# Patient Record
Sex: Male | Born: 1965 | Race: White | Hispanic: No | Marital: Married | Smoking: Never smoker
Health system: Southern US, Community
[De-identification: ages and names within clinical notes are randomized; demographics above are authoritative.]

## PROBLEM LIST (undated history)

## (undated) DIAGNOSIS — I639 Cerebral infarction, unspecified: Secondary | ICD-10-CM

## (undated) DIAGNOSIS — C189 Malignant neoplasm of colon, unspecified: Secondary | ICD-10-CM

## (undated) DIAGNOSIS — R809 Proteinuria, unspecified: Secondary | ICD-10-CM

## (undated) DIAGNOSIS — E7800 Pure hypercholesterolemia, unspecified: Secondary | ICD-10-CM

## (undated) DIAGNOSIS — I509 Heart failure, unspecified: Secondary | ICD-10-CM

## (undated) DIAGNOSIS — K047 Periapical abscess without sinus: Secondary | ICD-10-CM

## (undated) DIAGNOSIS — I10 Essential (primary) hypertension: Secondary | ICD-10-CM

## (undated) HISTORY — DX: Periapical abscess without sinus: K04.7

## (undated) HISTORY — DX: Cerebral infarction, unspecified: I63.9

## (undated) HISTORY — PX: APPENDECTOMY: SHX54

## (undated) HISTORY — DX: Proteinuria, unspecified: R80.9

## (undated) HISTORY — PX: COLON SURGERY: SHX602

## (undated) HISTORY — PX: CHOLECYSTECTOMY: SHX55

---

## 1998-12-30 DIAGNOSIS — E119 Type 2 diabetes mellitus without complications: Secondary | ICD-10-CM | POA: Insufficient documentation

## 2005-10-13 ENCOUNTER — Emergency Department (HOSPITAL_COMMUNITY): Admission: EM | Admit: 2005-10-13 | Discharge: 2005-10-13 | Payer: Self-pay | Admitting: Emergency Medicine

## 2007-11-29 ENCOUNTER — Emergency Department: Payer: Self-pay | Admitting: Internal Medicine

## 2010-05-13 ENCOUNTER — Inpatient Hospital Stay (HOSPITAL_COMMUNITY): Admission: EM | Admit: 2010-05-13 | Discharge: 2010-05-17 | Payer: Self-pay | Admitting: Emergency Medicine

## 2010-05-13 ENCOUNTER — Ambulatory Visit: Payer: Self-pay | Admitting: Cardiology

## 2010-05-13 DIAGNOSIS — I509 Heart failure, unspecified: Secondary | ICD-10-CM | POA: Insufficient documentation

## 2010-05-13 DIAGNOSIS — I428 Other cardiomyopathies: Secondary | ICD-10-CM | POA: Insufficient documentation

## 2010-05-13 LAB — CONVERTED CEMR LAB
BUN: 14 mg/dL
CO2: 31 meq/L
Calcium: 8.8 mg/dL
Chloride: 98 meq/L
Cholesterol: 192 mg/dL
Creatinine, Ser: 0.98 mg/dL
Glucose, Bld: 196 mg/dL
HDL: 32 mg/dL
Hgb A1c MFr Bld: 11.6 %
LDL Cholesterol: 131 mg/dL
Potassium: 3.6 meq/L
Sodium: 135 meq/L
TSH: 0.679 microintl units/mL
Triglycerides: 146 mg/dL

## 2010-05-17 LAB — CONVERTED CEMR LAB
HCT: 43.5 %
Hemoglobin: 14.6 g/dL
MCHC: 33.5 g/dL
MCV: 86.5 fL
Platelets: 227 10*3/uL
RBC: 5.02 M/uL
RDW: 13.3 %
WBC: 7.8 10*3/uL

## 2010-05-30 ENCOUNTER — Ambulatory Visit: Payer: Self-pay | Admitting: Nurse Practitioner

## 2010-05-30 DIAGNOSIS — E785 Hyperlipidemia, unspecified: Secondary | ICD-10-CM

## 2010-05-30 DIAGNOSIS — I10 Essential (primary) hypertension: Secondary | ICD-10-CM

## 2010-05-30 DIAGNOSIS — C189 Malignant neoplasm of colon, unspecified: Secondary | ICD-10-CM

## 2010-05-30 LAB — CONVERTED CEMR LAB
Bilirubin Urine: NEGATIVE
Blood Glucose, Fingerstick: 202
Blood in Urine, dipstick: NEGATIVE
Cholesterol, target level: 200 mg/dL
Glucose, Urine, Semiquant: NEGATIVE
HDL goal, serum: 40 mg/dL
Ketones, urine, test strip: NEGATIVE
LDL Goal: 70 mg/dL
Microalb, Ur: 1.69 mg/dL (ref 0.00–1.89)
Nitrite: NEGATIVE
Protein, U semiquant: NEGATIVE
Specific Gravity, Urine: 1.015
Urobilinogen, UA: 0.2
WBC Urine, dipstick: NEGATIVE
pH: 5

## 2010-06-01 ENCOUNTER — Ambulatory Visit: Payer: Self-pay | Admitting: Internal Medicine

## 2010-06-01 DIAGNOSIS — I5022 Chronic systolic (congestive) heart failure: Secondary | ICD-10-CM

## 2010-06-13 ENCOUNTER — Telehealth (INDEPENDENT_AMBULATORY_CARE_PROVIDER_SITE_OTHER): Payer: Self-pay | Admitting: Nurse Practitioner

## 2010-06-29 ENCOUNTER — Ambulatory Visit: Payer: Self-pay | Admitting: Nurse Practitioner

## 2010-06-29 DIAGNOSIS — E669 Obesity, unspecified: Secondary | ICD-10-CM | POA: Insufficient documentation

## 2010-06-29 LAB — CONVERTED CEMR LAB: Blood Glucose, Fingerstick: 128

## 2010-07-24 ENCOUNTER — Telehealth (INDEPENDENT_AMBULATORY_CARE_PROVIDER_SITE_OTHER): Payer: Self-pay | Admitting: Nurse Practitioner

## 2010-07-25 ENCOUNTER — Ambulatory Visit: Payer: Self-pay | Admitting: Internal Medicine

## 2010-08-03 ENCOUNTER — Telehealth (INDEPENDENT_AMBULATORY_CARE_PROVIDER_SITE_OTHER): Payer: Self-pay | Admitting: Nurse Practitioner

## 2010-08-24 ENCOUNTER — Ambulatory Visit: Payer: Self-pay | Admitting: Nurse Practitioner

## 2010-08-24 DIAGNOSIS — I8390 Asymptomatic varicose veins of unspecified lower extremity: Secondary | ICD-10-CM

## 2010-08-24 LAB — CONVERTED CEMR LAB
Blood Glucose, Fingerstick: 98
Cholesterol, target level: 200 mg/dL
HDL goal, serum: 40 mg/dL
Hgb A1c MFr Bld: 6 %
LDL Goal: 100 mg/dL

## 2010-10-01 ENCOUNTER — Ambulatory Visit: Payer: Self-pay | Admitting: Internal Medicine

## 2010-12-26 ENCOUNTER — Ambulatory Visit: Payer: Self-pay | Admitting: Internal Medicine

## 2011-01-11 ENCOUNTER — Telehealth (INDEPENDENT_AMBULATORY_CARE_PROVIDER_SITE_OTHER): Payer: Self-pay | Admitting: Nurse Practitioner

## 2011-01-15 ENCOUNTER — Encounter: Payer: Self-pay | Admitting: Internal Medicine

## 2011-01-21 ENCOUNTER — Ambulatory Visit (HOSPITAL_COMMUNITY)
Admission: RE | Admit: 2011-01-21 | Discharge: 2011-01-21 | Payer: Self-pay | Source: Home / Self Care | Attending: Internal Medicine | Admitting: Internal Medicine

## 2011-01-21 ENCOUNTER — Ambulatory Visit: Admission: RE | Admit: 2011-01-21 | Discharge: 2011-01-21 | Payer: Self-pay | Source: Home / Self Care

## 2011-01-27 LAB — CONVERTED CEMR LAB
BUN: 21 mg/dL (ref 6–23)
CO2: 27 meq/L (ref 19–32)
Calcium: 9.1 mg/dL (ref 8.4–10.5)
Chloride: 106 meq/L (ref 96–112)
Creatinine, Ser: 1 mg/dL (ref 0.4–1.5)
GFR calc non Af Amer: 92.43 mL/min (ref >60)
Glucose, Bld: 223 mg/dL — ABNORMAL HIGH (ref 70–99)
Potassium: 4.5 meq/L (ref 3.5–5.1)
Pro B Natriuretic peptide (BNP): 161.5 pg/mL — ABNORMAL HIGH (ref 0.0–100.0)
Sodium: 139 meq/L (ref 135–145)

## 2011-01-29 NOTE — Assessment & Plan Note (Signed)
Summary: /PER CHECK OUT/HAPPY BIRTHDAY/SAF   Primary Provider:  [facility]  CC:  rov.  Pt states he is doing alright but he keeps pain in his back.  Dr. 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  Marland Kitchen  History of Present Illness: Jared Hart is a 45 y/o male with HTN and DM2. Admitted in @ay  2011 for dyspnea and CP.  Cath with normal coronaries and EF 25% and marked colume overload.  RHC data:  Right atrial pressure 14.  Right ventricular pressure 47/12.  Right ventricular end-diastolic pressure 14.  Pulmonary artery pressure 54/20 (39).  Pulmonary capillary wedge pressure 26.Cardiac output 5.76 L per minute.  Cardiac index 2.52 L per minute per meter squared.  Pulmonary artery saturation 65%.  Diuresed.   At last visit carvedilol increased to 9.375 two times a day. Has tolerated well. Lifting weights and walking 2-3 miles every other day. No CP or SOB.  No swelling. Compliant with all meds. no lightheadedness. Not weighing every day but weight stable.   Current Medications (verified): 1)  Carvedilol 6.25 Mg Tabs (Carvedilol) .... Take 1 & 1/2 Tabs By Mouth Twice A Day 2)  Klor-Con M20 20 Meq Cr-Tabs (Potassium Chloride Crys Cr) .... One Tablet By Mouth Daily 3)  Aspir-Low 81 Mg Tbec (Aspirin) .... Take One Tablet By Mouth Daily 4)  Furosemide 40 Mg Tabs (Furosemide) .... One Tablet By Mouth Daily 5)  Lisinopril 20 Mg Tabs (Lisinopril) .... One Tablet By Mouth Daily For Blood Pressure 6)  Spironolactone 25 Mg Tabs (Spironolactone) .... One Tablet By Mouth Daily 7)  Pravastatin Sodium 40 Mg Tabs (Pravastatin Sodium) .... One Tablet By Mouth Nightly For Cholesterol 8)  Relion Ultima Test  Strp (Glucose Blood) 9)  Novolog 100 Unit/ml Soln (Insulin Aspart) .Marland Kitchen.. 15 Units Subcutaneously Two Times A Day  Allergies (verified): No Known Drug Allergies  Past History:  Past Medical History: Last updated: 06/01/2010 Current Problems:  HYPERLIPIDEMIA  (ICD-272.4) HYPERTENSION, BENIGN ESSENTIAL (ICD-401.1) CARDIOMYOPATHY (ICD-425.4)    --onset 5/11: EF 25% normal coronaries CHF (ICD-428.0) Hx of COLON CANCER (ICD-153.9) DIABETES MELLITUS, UNCONTROLLED (ICD-250.02)    Review of Systems       As per HPI and past medical history; otherwise all systems negative.   Vital Signs:  Patient profile:   45 year old male Height:      70.5 inches Weight:      224 pounds BMI:     31.80 Pulse rate:   88 / minute Pulse rhythm:   regular BP sitting:   132 / 74  (right arm) Cuff size:   regular  Vitals Entered By: Judithe Modest CMA (July 25, 2010 3:09 PM)  Physical Exam  General:  Gen: well appearing. no resp difficulty HEENT: normal Neck: supple. no JVD. Carotids 2+ bilat; no bruits. No lymphadenopathy or thryomegaly appreciated. Cor: PMI nondisplaced. Regular rate & rhythm. No rubs, gallops, murmur. Lungs: clear Abdomen: soft, nontender, nondistended. No hepatosplenomegaly. No bruits or masses. Good bowel sounds. Extremities: no cyanosis, clubbing, rash, edema Neuro: alert & orientedx3, cranial nerves grossly intact. moves all 4 extremities w/o difficulty. affect pleasant    Impression & Recommendations:  Problem # 1:  SYSTOLIC HEART FAILURE, CHRONIC (ICD-428.22) Doing very well. NYHA class I. Volume status looks good. Will titrate Coreg to 12.5 two times a day.See back in 6 weeks to titrate meds. Will check echo at next visit. Suspect EF may have recovered.   His updated medication list for  this problem includes:    Carvedilol 12.5 Mg Tabs (Carvedilol) ..... One twice a day    Aspir-low 81 Mg Tbec (Aspirin) .Marland Kitchen... Take one tablet by mouth daily    Furosemide 40 Mg Tabs (Furosemide) ..... One tablet by mouth daily    Lisinopril 20 Mg Tabs (Lisinopril) ..... One tablet by mouth daily for blood pressure    Spironolactone 25 Mg Tabs (Spironolactone) ..... One tablet by mouth daily  Patient Instructions: 1)  Your physician  recommends that you schedule a follow-up appointment in: 6 to 8 weeks  2)  Your physician has recommended you make the following change in your medication: Increase carvedilol to 12.5 mg one twice a day Prescriptions: CARVEDILOL 12.5 MG TABS (CARVEDILOL) one twice a day  #60 x 6   Entered by:   Charolotte Capuchin, RN   Authorized by:   Dolores Patty, MD, [facility]   Signed by:   Charolotte Capuchin, RN on 07/25/2010   Method used:   Electronically to        [facility] [phone]* (retail)       [address]       [place], [place]  [address]       Ph: [phone]       Fax: [phone]   RxID:   [id number]

## 2011-01-29 NOTE — Progress Notes (Signed)
Summary: refills  Phone Note Call from Patient Call back at Home Phone [phone] Call back at [phone]   Summary of Call: [name] needs refills from all his medicaitions including: carvedilol,klor-con, aspir, furosemide, lisinopril, sprionouctone, novolog prevastatin relion etc.  ([facility] [address]) Daphine Deutscher FNP  Initial call taken by: Manon Hilding,  June 13, 2010 11:20 AM  Follow-up for Phone Call        sent to N. Daphine Deutscher.  Dutch Quint RN  June 13, 2010 12:09 PM   Additional Follow-up for Phone Call Additional follow up Details #1::        meds sent as per pt request to [facility] except Aspirin is over the counter - no prescription needed Carvedilol was filled on 06/01/2010 by cardiology so either he was given the Rx during that visit or they called it into the pharmacy Additional Follow-up by: Lehman Prom FNP,  June 13, 2010 1:03 PM    Additional Follow-up for Phone Call Additional follow up Details #2::    Spoke with pt. -- advised as to refills sent.  Dutch Quint RN  June 13, 2010 2:05 PM  Follow-up by: Dutch Quint RN,  June 13, 2010 2:05 PM  Prescriptions: NOVOLOG MIX 70/30 FLEXPEN 70-30 % SUSP (INSULIN ASPART PROT & ASPART) 15 units subcutaneously two times a day  #1 month qs x 3   Entered and Authorized by:   Lehman Prom FNP   Signed by:   Lehman Prom FNP on 06/13/2010   Method used:   Electronically to        [facility] [phone]* (retail)       [address]       [place], [place]  [address]       Ph: [phone]       Fax: [phone]   RxID:   [id number] PRAVASTATIN SODIUM 40 MG TABS (PRAVASTATIN SODIUM) One tablet by mouth nightly for cholesterol  #30 x 3   Entered and Authorized by:   Lehman Prom FNP   Signed by:   Lehman Prom FNP on 06/13/2010   Method used:   Electronically to        [facility] [phone]* (retail)       [address]       [place], [place]  [address]       Ph: [phone]       Fax: [phone]   RxID:   [id number] SPIRONOLACTONE 25 MG TABS (SPIRONOLACTONE) One tablet by mouth daily  #30 x 3   Entered and Authorized by:   Lehman Prom FNP   Signed by:   Lehman Prom FNP on 06/13/2010   Method used:   Electronically to        [facility] [phone]* (retail)       [address]       [place], [place]  [address]       Ph: [phone]       Fax: [phone]   RxID:   [id number] LISINOPRIL 20 MG TABS (LISINOPRIL) One tablet by mouth daily for blood pressure  #30 x 3   Entered and Authorized by:   Lehman Prom FNP   Signed by:   Lehman Prom FNP on 06/13/2010   Method used:   Electronically to        [facility] [phone]* (retail)       [address]       [place], [place]  [address]  Ph: [phone]       Fax: [phone]   RxID:   [id number] FUROSEMIDE 40 MG TABS (FUROSEMIDE) One tablet by mouth daily  #30 x 3   Entered and Authorized by:   Lehman Prom FNP   Signed by:   Lehman Prom FNP on 06/13/2010   Method used:   Electronically to        [facility] [phone]* (retail)       [address]       [place], [place]  [address]       Ph: [phone]       Fax: [phone]   RxID:   [id number] KLOR-CON M20 20 MEQ CR-TABS (POTASSIUM CHLORIDE CRYS CR) One tablet by mouth daily  #30 x 3   Entered and Authorized by:   Lehman Prom FNP   Signed by:   Lehman Prom FNP on 06/13/2010   Method used:   Electronically to        [facility] [phone]* (retail)       [address]       [place], [place]  [address]       Ph: [phone]       Fax: [phone]   RxID:   [id number]   Appended Document: refills    Clinical Lists Changes  Medications: Changed medication from NOVOLOG MIX 70/30 FLEXPEN 70-30 % SUSP (INSULIN ASPART PROT & ASPART) 15 units subcutaneously two times a day to NOVOLOG 100 UNIT/ML SOLN (INSULIN ASPART) 15 units subcutaneously two times a day - Signed Rx of NOVOLOG 100 UNIT/ML SOLN  (INSULIN ASPART) 15 units subcutaneously two times a day;  #1 month qs x 1;  Signed;  Entered by: Lehman Prom FNP;  Authorized by: Lehman Prom FNP;  Method used: Electronically to [facility] [phone]*, [address] [place], [place]  [address], Ph: [phone], Fax: [phone]    Prescriptions: NOVOLOG 100 UNIT/ML SOLN (INSULIN ASPART) 15 units subcutaneously two times a day  #1 month qs x 1   Entered and Authorized by:   Lehman Prom FNP   Signed by:   Lehman Prom FNP on 06/15/2010   Method used:   Electronically to        [facility] [phone]* (retail)       [address]       [place], [place]  [address]       Ph: [phone]       Fax: [phone]   RxID:   [id number]

## 2011-01-29 NOTE — Assessment & Plan Note (Signed)
Summary: Diabetes/HTN   Vital Signs:  Patient profile:   45 year old male Weight:      224 pounds BMI:     31.80 Temp:     97.9 degrees F oral Pulse rate:   79 / minute Pulse rhythm:   regular Resp:     20 per minute BP sitting:   132 / 86  (left arm) Cuff size:   large  Vitals Entered By: Levon Hedger (June 29, 2010 10:22 AM) CC: follow-up visit Dm...pt states he has noticed white color on his toenails since he started medication, Lipid Management, CHF Management Is Patient Diabetic? Yes Pain Assessment Patient in pain? yes     Location: back Onset of pain  Constant CBG Result 128  Does patient need assistance? Functional Status Self care Ambulation Normal   Primary Care Provider:  [facility]  CC:  follow-up visit Dm...pt states he has noticed white color on his toenails since he started medication, Lipid Management, and CHF Management.  History of Present Illness:  Pt into the office for follow up - diabetes  Pt presents today with ALL his medication He did not bring his blood sugar log with him but reports that he is checking twice daily. Pt wanted an insulin pen during his last visit but was not able to afford so he was written for the vial which pt reports that he has received and is taking as ordered  Obesity - Pt is down 10 more pounds since his last visit Pt is really making a conscious effort at decreasing his weight. He is also exercising and walking.  Cardiology - pt had an appt on last week but he was rescheduled an appt on July 27th.  CHF History:      He denies headache, chest pain, palpitations, dyspnea with exertion, and peripheral edema.  Daily weights are not being checked.  He understands fluid management and sodium restriction and is following this regimen.  The patient expresses understanding of the treatment plan and his medications.  He admits to being compliant with his medications.  ADL's are not being done without symptoms or  restrictions.  The patient has not been enrolled in the CHF Eduction Program.  He denies any side effects from his medications.    Diabetes Management History:      The patient is a 45 years old male who comes in for evaluation of DM Type 2.  He has not been enrolled in the "Diabetic Education Program".  He states understanding of dietary principles and is following his diet appropriately.  Sensory loss is noted.  Self foot exams are not being performed.  He is checking home blood sugars.  He says that he is exercising.        Hypoglycemic symptoms are not occurring.  No hyperglycemic symptoms are reported.        No changes have been made to his treatment plan since last visit.    Lipid Management History:      Positive NCEP/ATP III risk factors include diabetes, HDL cholesterol less than 40, family history for ischemic heart disease (females less than 45 years old), and hypertension.  Negative NCEP/ATP III risk factors include male age less than 55 years old, non-tobacco-user status, no ASHD (atherosclerotic heart disease), no prior stroke/TIA, no peripheral vascular disease, and no history of aortic aneurysm.        The patient states that he knows about the "Therapeutic Lifestyle Change" diet.  His compliance with the  TLC diet is good.  The patient does not know about adjunctive measures for cholesterol lowering.  Adjunctive measures started by the patient include weight reduction.  He expresses no side effects from his lipid-lowering medication.  The patient denies any symptoms to suggest myopathy or liver disease.      Habits & Providers  Alcohol-Tobacco-Diet     Alcohol drinks/day: 0     Tobacco Status: never  Exercise-Depression-Behavior     Does Patient Exercise: yes     Exercise Counseling: to improve exercise regimen     Have you felt down or hopeless? no     Have you felt little pleasure in things? no     Depression Counseling: not indicated; screening negative for depression      Drug Use: never  Medications Prior to Update: 1)  Carvedilol 6.25 Mg Tabs (Carvedilol) .... Take 1 & 1/2 Tabs By Mouth Twice A Day 2)  Klor-Con M20 20 Meq Cr-Tabs (Potassium Chloride Crys Cr) .... One Tablet By Mouth Daily 3)  Aspir-Low 81 Mg Tbec (Aspirin) .... Take One Tablet By Mouth Daily 4)  Furosemide 40 Mg Tabs (Furosemide) .... One Tablet By Mouth Daily 5)  Lisinopril 20 Mg Tabs (Lisinopril) .... One Tablet By Mouth Daily For Blood Pressure 6)  Spironolactone 25 Mg Tabs (Spironolactone) .... One Tablet By Mouth Daily 7)  Pravastatin Sodium 40 Mg Tabs (Pravastatin Sodium) .... One Tablet By Mouth Nightly For Cholesterol 8)  Relion Ultima Test  Strp (Glucose Blood) 9)  Novolog 100 Unit/ml Soln (Insulin Aspart) .Marland Kitchen.. 15 Units Subcutaneously Two Times A Day  Allergies (verified): No Known Drug Allergies  Review of Systems CV:  Denies chest pain or discomfort. Resp:  Denies cough. GI:  Denies abdominal pain, nausea, and vomiting. GU:  Denies dysuria. MS:  toenails are getting white. Endo:  Denies excessive thirst and excessive urination.  Physical Exam  General:  alert.   Head:  normocephalic.   Lungs:  normal breath sounds.   Heart:  normal rate and regular rhythm.   Abdomen:  normal bowel sounds.   Msk:  normal ROM.   Neurologic:  alert & oriented X3.   Skin:  color normal.   Psych:  Oriented X3.    Diabetes Management Exam:    Foot Exam (with socks and/or shoes not present):       Sensory-Monofilament:          Left foot: normal          Right foot: normal       Nails:          Left foot: toe nail white          Right foot: toe nail white   Impression & Recommendations:  Problem # 1:  DIABETES MELLITUS, UNCONTROLLED (ICD-250.02) Pt is doing better advised His updated medication list for this problem includes:    Aspir-low 81 Mg Tbec (Aspirin) .Marland Kitchen... Take one tablet by mouth daily    Lisinopril 20 Mg Tabs (Lisinopril) ..... One tablet by mouth daily for blood  pressure    Novolog 100 Unit/ml Soln (Insulin aspart) .Marland KitchenMarland KitchenMarland KitchenMarland Kitchen 15 units subcutaneously two times a day  Orders: Capillary Blood Glucose/CBG ([address])  Problem # 2:  HYPERTENSION, BENIGN ESSENTIAL (ICD-401.1) BP is doing well. Reviewed DASH diet His updated medication list for this problem includes:    Carvedilol 6.25 Mg Tabs (Carvedilol) .Marland Kitchen... Take 1 & 1/2 tabs by mouth twice a day    Furosemide 40 Mg Tabs (Furosemide) .Marland KitchenMarland KitchenMarland KitchenMarland Kitchen  One tablet by mouth daily    Lisinopril 20 Mg Tabs (Lisinopril) ..... One tablet by mouth daily for blood pressure    Spironolactone 25 Mg Tabs (Spironolactone) ..... One tablet by mouth daily  Problem # 3:  HYPERLIPIDEMIA (ICD-272.4)  His updated medication list for this problem includes:    Pravastatin Sodium 40 Mg Tabs (Pravastatin sodium) ..... One tablet by mouth nightly for cholesterol  Problem # 4:  CHF (ICD-428.0) pt to keep appointment with cardiology later this month Stable for now His updated medication list for this problem includes:    Carvedilol 6.25 Mg Tabs (Carvedilol) .Marland Kitchen... Take 1 & 1/2 tabs by mouth twice a day    Aspir-low 81 Mg Tbec (Aspirin) .Marland Kitchen... Take one tablet by mouth daily    Furosemide 40 Mg Tabs (Furosemide) ..... One tablet by mouth daily    Lisinopril 20 Mg Tabs (Lisinopril) ..... One tablet by mouth daily for blood pressure    Spironolactone 25 Mg Tabs (Spironolactone) ..... One tablet by mouth daily  Problem # 5:  OBESITY (ICD-278.00) pt is doing very well with diet and exercise in efforts to lose weight down 10 pounds since last visit  Complete Medication List: 1)  Carvedilol 6.25 Mg Tabs (Carvedilol) .... Take 1 & 1/2 tabs by mouth twice a day 2)  Klor-con M20 20 Meq Cr-tabs (Potassium chloride crys cr) .... One tablet by mouth daily 3)  Aspir-low 81 Mg Tbec (Aspirin) .... Take one tablet by mouth daily 4)  Furosemide 40 Mg Tabs (Furosemide) .... One tablet by mouth daily 5)  Lisinopril 20 Mg Tabs (Lisinopril) .... One tablet by  mouth daily for blood pressure 6)  Spironolactone 25 Mg Tabs (Spironolactone) .... One tablet by mouth daily 7)  Pravastatin Sodium 40 Mg Tabs (Pravastatin sodium) .... One tablet by mouth nightly for cholesterol 8)  Relion Ultima Test Strp (Glucose blood) 9)  Novolog 100 Unit/ml Soln (Insulin aspart) .Marland Kitchen.. 15 units subcutaneously two times a day  CHF Assessment/Plan:      The patient's current weight is 224 pounds.  His previous weight was 234 pounds.  His last cardiac ejection fraction was 25 % on 05/13/2010.    Diabetes Management Assessment/Plan:      The following lipid goals have been established for the patient: Total cholesterol goal of 200; LDL cholesterol goal of 70; HDL cholesterol goal of 40; Triglyceride goal of 150.  His blood pressure goal is < 130/80.    Lipid Assessment/Plan:      Based on NCEP/ATP III, the patient's risk factor category is "history of diabetes".  The patient's lipid goals are as follows: Total cholesterol goal is 200; LDL cholesterol goal is 70; HDL cholesterol goal is 40; Triglyceride goal is 150.  His LDL cholesterol goal has not been met.    Diabetic Foot Exam Foot Inspection Is there a history of a foot ulcer?              No Is there a foot ulcer now?              No Can the patient see the bottom of their feet?          No Are the shoes appropriate in style and fit?          Yes Is there swelling or an abnormal foot shape?          No Are the toenails long?  Yes Are the toenails thick?                No Are the toenails ingrown?              No Is there heavy callous build-up?              No Is there pain in the calf muscle (Intermittent claudication) when walking?    NoIs there a claw toe deformity?              No Is there elevated skin temperature?            No Is there limited ankle dorsiflexion?            No Is there foot or ankle muscle weakness?            No  Diabetic Foot Care Education Pulse Check          Right Foot           Left Foot Dorsalis Pedis:        normal            normal    10-g (5.07) Semmes-Weinstein Monofilament Test Performed by: Levon Hedger          Right Foot          Left Foot Visual Inspection                 Patient Instructions: 1)  Diabetes - keep up the good work with diet and excerise in addition to medications. 2)  Continue to check your blood sugar daily before breakfast 3)  Insulin - This is will be more affordable at [facility].  So if you have an appointment for eligibilty then you can use the pharmacy.  4)  Keep your appointment with cardiology 5)  Your medications have been sent to [facility] pharmacy 6)  Call [phone] 3 days before you need your prescriptions and leave a message on the refill line with the names of ALL your medications.  Remember you are a new patient so they will need to get you in their system 7)  Follow up in 8 weeks with n.martin,fnp for diabetes 8)  Will need u/a, Hbga1c, cbg Prescriptions: PRAVASTATIN SODIUM 40 MG TABS (PRAVASTATIN SODIUM) One tablet by mouth nightly for cholesterol  #30 x 2   Entered and Authorized by:   Lehman Prom FNP   Signed by:   Lehman Prom FNP on 06/29/2010   Method used:   Faxed to ...       [facility] - Pharmac (retail)       [address] [place], [place]  [address]       Ph: [phone]       Fax: [phone]   RxID:   [id number] SPIRONOLACTONE 25 MG TABS (SPIRONOLACTONE) One tablet by mouth daily  #30 x 2   Entered and Authorized by:   Lehman Prom FNP   Signed by:   Lehman Prom FNP on 06/29/2010   Method used:   Faxed to ...       [facility] - Pharmac (retail)       [address] [place], [place]  [address]       Ph: [phone]       Fax: [phone]   RxID:   [id number] LISINOPRIL 20 MG TABS (LISINOPRIL) One tablet by mouth  daily for blood pressure  #30 x 2   Entered and Authorized by:    Lehman Prom FNP   Signed by:   Lehman Prom FNP on 06/29/2010   Method used:   Faxed to ...       [facility] - Pharmac (retail)       [address] [place], [place]  [address]       Ph: [phone]       Fax: [phone]   RxID:   [id number] FUROSEMIDE 40 MG TABS (FUROSEMIDE) One tablet by mouth daily  #30 x 2   Entered and Authorized by:   Lehman Prom FNP   Signed by:   Lehman Prom FNP on 06/29/2010   Method used:   Faxed to ...       [facility] - Pharmac (retail)       [address] [place], [place]  [address]       Ph: [phone]       Fax: [phone]   RxID:   [id number] KLOR-CON M20 20 MEQ CR-TABS (POTASSIUM CHLORIDE CRYS CR) One tablet by mouth daily  #30 x 2   Entered and Authorized by:   Lehman Prom FNP   Signed by:   Lehman Prom FNP on 06/29/2010   Method used:   Faxed to ...       [facility] - Pharmac (retail)       [address] [place], [place]  [address]       Ph: [phone]       Fax: [phone]   RxID:   [id number] CARVEDILOL 6.25 MG TABS (CARVEDILOL) Take 1 & 1/2 tabs by mouth twice a day  #90 x 2   Entered and Authorized by:   Lehman Prom FNP   Signed by:   Lehman Prom FNP on 06/29/2010   Method used:   Faxed to ...       [facility] - Pharmac (retail)       [address] [place], [place]  [address]       Ph: [phone]       Fax: [phone]   RxID:   [id number] NOVOLOG 100 UNIT/ML SOLN (INSULIN ASPART) 15 units subcutaneously two times a day  #1 month qs x 2   Entered and Authorized by:   Lehman Prom FNP   Signed by:   Lehman Prom FNP on 06/29/2010   Method used:   Faxed to ...       [facility] - Pharmac (retail)       [address] [place], [place]  [address]       Ph: [phone]       Fax:  [phone]   RxID:   [id number]   Last LDL:                                                 131 (05/13/2010 9:30:02 AM)        Diabetic Foot Exam Pulse Check          Right Foot          Left Foot  Dorsalis Pedis:        normal            normal    10-g (5.07) Semmes-Weinstein Monofilament Test Performed by: Levon Hedger          Right Foot          Left Foot Visual Inspection               Test Control      normal         normal Site 1         normal         normal Site 2         normal         normal Site 3         normal         normal Site 4         normal         normal Site 5         normal         normal Site 6         abnormal         normal Site 7         normal         normal Site 8         normal         normal Site 9         abnormal         abnormal Site 10         normal         normal  Impression      normal         normal

## 2011-01-29 NOTE — Assessment & Plan Note (Signed)
Summary: 9:15  eph   Primary Provider:  [facility]  CC:  eph.  .  History of Present Illness: Jared Hart is a 45 y/o male with HTN and DM2. Recently admitted for dyspnea and CP.  Cath with normal coronaries and EF 25% and marked colume overload.  RHC data:  Right atrial pressure 14.  Right ventricular pressure 47/12.  Right ventricular end-diastolic pressure 14.  Pulmonary artery pressure 54/20 (39).  Pulmonary capillary wedge pressure 26.Cardiac output 5.76 L per minute.  Cardiac index 2.52 L per minute per meter squared.  Pulmonary artery saturation 65%.  Diuresed.   Here for post hospital f/u. No further CP. Breathing very good. Walking 2 miles every other day. And rode his bike about 4 miles. No SOB. No swelling. Compliant with all meds. no lightheadedness. Not weighing every day but weight stable.   Current Medications (verified): 1)  Carvedilol 6.25 Mg Tabs (Carvedilol) .... One Tablet By Mouth Two Times A Day 2)  Klor-Con M20 20 Meq Cr-Tabs (Potassium Chloride Crys Cr) .... One Tablet By Mouth Daily 3)  Aspir-Low 81 Mg Tbec (Aspirin) .... Take One Tablet By Mouth Daily 4)  Furosemide 40 Mg Tabs (Furosemide) .... One Tablet By Mouth Daily 5)  Lisinopril 20 Mg Tabs (Lisinopril) .... One Tablet By Mouth Daily For Blood Pressure 6)  Spironolactone 25 Mg Tabs (Spironolactone) .... One Tablet By Mouth Daily 7)  Pravastatin Sodium 40 Mg Tabs (Pravastatin Sodium) .... One Tablet By Mouth Nightly For Cholesterol 8)  Relion Ultima Test  Strp (Glucose Blood) 9)  Novolog Mix 70/30 Flexpen 70-30 % Susp (Insulin Aspart Prot & Aspart) .Marland Kitchen.. 15 Units Subcutaneously Two Times A Day  Allergies (verified): No Known Drug Allergies  Past History:  Past Medical History: Current Problems:  HYPERLIPIDEMIA (ICD-272.4) HYPERTENSION, BENIGN ESSENTIAL (ICD-401.1) CARDIOMYOPATHY (ICD-425.4)    --onset 5/11: EF 25% normal coronaries CHF (ICD-428.0) Hx of COLON CANCER (ICD-153.9) DIABETES MELLITUS,  UNCONTROLLED (ICD-250.02)    Review of Systems       As per HPI and past medical history; otherwise all systems negative.   Vital Signs:  Patient profile:   45 year old male Height:      70.5 inches Weight:      234 pounds BMI:     33.22 Pulse rate:   74 / minute Pulse rhythm:   regular BP sitting:   118 / 80  (right arm) Cuff size:   large  Vitals Entered By: Judithe Modest CMA (June 01, 2010 9:48 AM)  Physical Exam  General:  Gen: well appearing. no resp difficulty HEENT: normal Neck: supple. no JVD. Carotids 2+ bilat; no bruits. No lymphadenopathy or thryomegaly appreciated. Cor: PMI nondisplaced. Regular rate & rhythm. No rubs, gallops. soft systolic murmur at RSB. Lungs: clear Abdomen: soft, nontender, nondistended. No hepatosplenomegaly. No bruits or masses. Good bowel sounds. Extremities: no cyanosis, clubbing, rash, edema Neuro: alert & orientedx3, cranial nerves grossly intact. moves all 4 extremities w/o difficulty. affect pleasant    Impression & Recommendations:  Problem # 1:  SYSTOLIC HEART FAILURE, CHRONIC (ICD-428.22) Doing very well. NYHA class I. Volume status looks good. Discussed use of sliding scale lasix. Will titrate Coreg to 9.375 two times a day. Check BMET and BNP. See back in 3-4 weeks to titrate meds. Will need f/u echo in 3 months.   Other Orders: EKG w/ Interpretation (93000) TLB-BMP (Basic Metabolic Panel-BMET) (80048-METABOL) TLB-BNP (B-Natriuretic Peptide) (83880-BNPR)  Patient Instructions: 1)  Increase Carvedilol to 6.25mg  1 & 1/2  tabs two times a day  2)  Your physician recommends that you weigh, daily, at the same time every day, and in the same amount of clothing.  Please record your daily weights on the handout provided and bring it to your next appointment. 3)  Labs today 4)  Follow up in 3-4 weeks Prescriptions: CARVEDILOL 6.25 MG TABS (CARVEDILOL) Take 1 & 1/2 tabs by mouth twice a day  #90 x 6   Entered by:   Meredith Staggers,  RN   Authorized by:   Dolores Patty, MD, [facility]   Signed by:   Meredith Staggers, RN on 06/01/2010   Method used:   Electronically to        [facility] [phone]* (retail)       [address]       [place], [place]  [address]       Ph: [phone]       Fax: [phone]   RxID:   [id number]   Appended Document: 9:15  eph ok

## 2011-01-29 NOTE — Assessment & Plan Note (Signed)
Summary: Diabetes   Vital Signs:  Patient profile:   45 year old male Weight:      225.3 pounds BMI:     31.99 Temp:     97.9 degrees F oral Pulse rate:   84 / minute Pulse rhythm:   regular Resp:     20 per minute BP sitting:   112 / 80  (left arm) Cuff size:   regular  Vitals Entered By: Levon Hedger (August 24, 2010 10:23 AM)  Nutrition Counseling: Patient's BMI is greater than 25 and therefore counseled on weight management options.  Diabetic Foot Exam Foot Inspection Is there a history of a foot ulcer?              No Is there a foot ulcer now?              No Can the patient see the bottom of their feet?          No Are the shoes appropriate in style and fit?          No Is there swelling or an abnormal foot shape?          No Are the toenails long?                Yes Are the toenails thick?                No Are the toenails ingrown?              No Is there pain in the calf muscle (Intermittent claudication) when walking?    NoIs there a claw toe deformity?              No Is there elevated skin temperature?            No Is there limited ankle dorsiflexion?            No Is there foot or ankle muscle weakness?            No  Diabetic Foot Care Education Patient educated on appropriate care of diabetic feet.  Pulse Check          Right Foot          Left Foot Dorsalis Pedis:        normal            normal  CC: follow-up visit DM, Hypertension Management, Lipid Management Is Patient Diabetic? Yes CBG Result 98 CBG Device ID B  Does patient need assistance? Functional Status Self care Ambulation Normal   Primary Care Provider:  [facility]  CC:  follow-up visit DM, Hypertension Management, and Lipid Management.  History of Present Illness:  Pt into the office for diabetes f/u. Presents today with all his medications.  Cardiology - Appt on next month.  He will get a echocardiogram  Diabetes Management History:      The patient is a 45 years old  male who comes in for evaluation of Type 2 Diabetes Mellitus.  He has not been enrolled in the "Diabetic Education Program".  He states understanding of dietary principles and is following his diet appropriately.  No sensory loss is reported.  Self foot exams are being performed.  He is checking home blood sugars.  He says that he is exercising.        Reported hypoglycemic symptoms include weakness.  Frequency of hypoglycemic symptoms are reported to be seldom.  No hyperglycemic symptoms are reported.  Other comments include:  Pt had one hypoglycemic episode since his last visit here for which he reports that it made him feel awful and he was scared.  He does not want to have that feeling again so he tries to eat to keep his blood sugar up.        There are no symptoms to suggest diabetic complications.  No changes have been made to his treatment plan since last visit.    Hypertension History:      He denies headache, chest pain, and palpitations.  He notes no problems with any antihypertensive medication side effects.        Positive major cardiovascular risk factors include diabetes, hyperlipidemia, hypertension, and family history for ischemic heart disease (females less than 43 years old).  Negative major cardiovascular risk factors include male age less than 49 years old and non-tobacco-user status.        Positive history for target organ damage include cardiac end organ damage (either CHF or LVH).  Further assessment for target organ damage reveals no history of ASHD, stroke/TIA, peripheral vascular disease, renal insufficiency, or hypertensive retinopathy.    Lipid Management History:      Positive NCEP/ATP III risk factors include diabetes, HDL cholesterol less than 40, family history for ischemic heart disease (females less than 14 years old), and hypertension.  Negative NCEP/ATP III risk factors include male age less than 69 years old, non-tobacco-user status, no ASHD (atherosclerotic heart  disease), no prior stroke/TIA, no peripheral vascular disease, and no history of aortic aneurysm.        The patient states that he knows about the "Therapeutic Lifestyle Change" diet.  His compliance with the TLC diet is good.  He expresses no side effects from his lipid-lowering medication.  Comments include: pt is taking meds as ordered.  The patient denies any symptoms to suggest myopathy or liver disease.      Habits & Providers  Alcohol-Tobacco-Diet     Alcohol drinks/day: 0     Tobacco Status: never  Exercise-Depression-Behavior     Does Patient Exercise: yes     Exercise Counseling: not indicated; exercise is adequate     Depression Counseling: not indicated; screening negative for depression     Drug Use: never  Allergies: No Known Drug Allergies  Review of Systems General:  Denies fatigue and fever. CV:  Denies chest pain or discomfort. Resp:  Denies cough. GI:  Denies abdominal pain, nausea, and vomiting.  Physical Exam  General:  alert.   Head:  male-pattern balding.   Lungs:  normal breath sounds.   Heart:  normal rate and regular rhythm.   Abdomen:  normal bowel sounds.   Msk:  normal ROM.   Neurologic:  alert & oriented X3.   Skin:  varicose veins Psych:  Oriented X3.     Impression & Recommendations:  Problem # 1:  DIABETES MELLITUS, UNCONTROLLED (ICD-250.02) pt is doing good will decrease the novolog to 10units two times a day  pt is eating right and exercising which is helping his exercise The following medications were removed from the medication list:    Novolog 100 Unit/ml Soln (Insulin aspart) .Marland KitchenMarland KitchenMarland KitchenMarland Kitchen 15 units subcutaneously two times a day His updated medication list for this problem includes:    Aspir-low 81 Mg Tbec (Aspirin) .Marland Kitchen... Take one tablet by mouth daily    Lisinopril 20 Mg Tabs (Lisinopril) ..... One tablet by mouth daily for blood pressure    Metformin Hcl 500 Mg Tabs (Metformin hcl) .Marland Kitchen..Marland Kitchen Two  tablets by mouth two times a  day  Orders: Hemoglobin A1C (83036) Capillary Blood Glucose/CBG (04540)  Problem # 2:  HYPERTENSION, BENIGN ESSENTIAL (ICD-401.1) BP is doing well continue current medications His updated medication list for this problem includes:    Carvedilol 12.5 Mg Tabs (Carvedilol) ..... One twice a day    Furosemide 40 Mg Tabs (Furosemide) ..... One tablet by mouth daily    Lisinopril 20 Mg Tabs (Lisinopril) ..... One tablet by mouth daily for blood pressure    Spironolactone 25 Mg Tabs (Spironolactone) ..... One tablet by mouth daily  Problem # 3:  OBESITY (ICD-278.00) pt is still working out to  lose weight  Problem # 4:  HYPERLIPIDEMIA (ICD-272.4)  His updated medication list for this problem includes:    Pravastatin Sodium 40 Mg Tabs (Pravastatin sodium) ..... One tablet by mouth nightly for cholesterol  Problem # 5:  CHF (ICD-428.0) pt is to f/u with cardiology as ordered His updated medication list for this problem includes:    Carvedilol 12.5 Mg Tabs (Carvedilol) ..... One twice a day    Aspir-low 81 Mg Tbec (Aspirin) .Marland Kitchen... Take one tablet by mouth daily    Furosemide 40 Mg Tabs (Furosemide) ..... One tablet by mouth daily    Lisinopril 20 Mg Tabs (Lisinopril) ..... One tablet by mouth daily for blood pressure    Spironolactone 25 Mg Tabs (Spironolactone) ..... One tablet by mouth daily  Complete Medication List: 1)  Carvedilol 12.5 Mg Tabs (Carvedilol) .... One twice a day 2)  Klor-con M20 20 Meq Cr-tabs (Potassium chloride crys cr) .... One tablet by mouth daily 3)  Aspir-low 81 Mg Tbec (Aspirin) .... Take one tablet by mouth daily 4)  Furosemide 40 Mg Tabs (Furosemide) .... One tablet by mouth daily 5)  Lisinopril 20 Mg Tabs (Lisinopril) .... One tablet by mouth daily for blood pressure 6)  Spironolactone 25 Mg Tabs (Spironolactone) .... One tablet by mouth daily 7)  Pravastatin Sodium 40 Mg Tabs (Pravastatin sodium) .... One tablet by mouth nightly for cholesterol 8)  Relion  Ultima Test Strp (Glucose blood) 9)  Metformin Hcl 500 Mg Tabs (Metformin hcl) .... Two tablets by mouth two times a day  Diabetes Management Assessment/Plan:      The following lipid goals have been established for the patient: Total cholesterol goal of 200; LDL cholesterol goal of 100; HDL cholesterol goal of 40; Triglyceride goal of 150.  His blood pressure goal is < 130/80.    Hypertension Assessment/Plan:      The patient's hypertensive risk group is category C: Target organ damage and/or diabetes.  His calculated 10 year risk of coronary heart disease is 11 %.  Today's blood pressure is 112/80.  His blood pressure goal is < 130/80.  Lipid Assessment/Plan:      Based on NCEP/ATP III, the patient's risk factor category is "history of diabetes".  The patient's lipid goals are as follows: Total cholesterol goal is 200; LDL cholesterol goal is 100; HDL cholesterol goal is 40; Triglyceride goal is 150.  His LDL cholesterol goal has not been met.    Patient Instructions: 1)  Diabetes - Your Hgba1c - 6.0 2)  THAT IS GREAT. 3)  YOU CAN STOP YOUR INSULIN. 4)  Start metformin 500mg  by mouth daily with breakfast and dinner.  5)  If you see blood sugars over 150 after one week of the pills then increase to 2 twice per day 6)  Follow up in this office in 3  months 7)  Come fasting for labs.  Will also need a flu vaccine 8)  Will need u/a, microalbumin, cbg, hgba1c, lipids. Prescriptions: METFORMIN HCL 500 MG TABS (METFORMIN HCL) Two tablets by mouth two times a day  #120 x 3   Entered and Authorized by:   Lehman Prom FNP   Signed by:   Lehman Prom FNP on 08/24/2010   Method used:   Print then Give to Patient   RxID:   [id number]   Laboratory Results   Blood Tests   Date/Time Received: August 24, 2010 11:23 AM   HGBA1C: 6.0%   (Normal Range: Non-Diabetic - 3-6%   Control Diabetic - 6-8%) CBG Random:: 98mg /dL

## 2011-01-29 NOTE — Progress Notes (Signed)
Summary: Out of Insulin Med  Phone Note Call from Patient Call back at [phone]   Summary of Call: The pt is out of insulin and would like the provider send a refill request.    [facility] located at [place]) Daphine Deutscher FNP Initial call taken by: Manon Hilding,  July 24, 2010 8:04 AM  Follow-up for Phone Call        Forward to N. Daphine Deutscher, fnp spoke with pt he is not eligible and has an appt in August but in need of some insulin sent to [facility] [address]  I told pt that insulin is not on the $4 dollar plan and he stated he knows and is needs insulin because he is completely out. Follow-up by: Levon Hedger,  July 24, 2010 12:25 PM  Additional Follow-up for Phone Call Additional follow up Details #1::        notify pt  - Rx sent electronically to [facility] Additional Follow-up by: Lehman Prom FNP,  July 24, 2010 2:16 PM    Additional Follow-up for Phone Call Additional follow up Details #2::    pt notified. Follow-up by: Levon Hedger,  July 27, 2010 2:49 PM  Prescriptions: NOVOLOG 100 UNIT/ML SOLN (INSULIN ASPART) 15 units subcutaneously two times a day  #1 month qs x 3   Entered and Authorized by:   Lehman Prom FNP   Signed by:   Lehman Prom FNP on 07/24/2010   Method used:   Electronically to        [facility] [phone]* (retail)       [address]       [place], [place]  [address]       Ph: [phone]       Fax: [phone]   RxID:   [id number]

## 2011-01-29 NOTE — Progress Notes (Signed)
Summary: Blood Sugar collapsed  Phone Note Call from Patient   Summary of Call: Yesterday his blood sugar collapsed to 22 and now 179 .  Pt wantss to know if someone call him back. [facility] FNP Initial call taken by: Manon Hilding,  August 03, 2010 12:13 PM  Follow-up for Phone Call        Left message on answering machine for pt. to return call.  Dutch Quint RN  August 03, 2010 3:34 PM  Took normal dose of insulin yesterday, went to eat, sugar dropped fast.  Broke into a sweat, eyes started burning, very sleepy and checked sugar, read 22.  Drank some OJ and ate, CBG went up to 94,95.  Only change is diet, exercise and change of insulin.  This has never happened and scared him.  Wanted to let provider know.   Today, CBG was 138, feeling OK. Follow-up by: Dutch Quint RN,  August 03, 2010 3:41 PM  Additional Follow-up for Phone Call Additional follow up Details #1::        yes, thanks for this information. pt had lost weight and was doing very well with diet and exercise. He will need to keep doing his lifestyle changes and if he have many more episodes of hypoglycemia may need to decrease insulin (only with provider approval) before his next visit here Additional Follow-up by: Lehman Prom FNP,  August 03, 2010 3:59 PM    Additional Follow-up for Phone Call Additional follow up Details #2::    Pt. advised of provider's response- verbalized understanding. Follow-up by: Dutch Quint RN,  August 03, 2010 4:28 PM

## 2011-01-29 NOTE — Letter (Signed)
Summary: PT INFORMATION SHEET  PT INFORMATION SHEET   Imported By: Arta Bruce 05/31/2010 10:48:30  _____________________________________________________________________  External Attachment:    Type:   Image     Comment:   External Document

## 2011-01-29 NOTE — Assessment & Plan Note (Signed)
Summary: NEW - Hospital Followup   Vital Signs:  Patient profile:   45 year old male Height:      231.1 inches Weight:      70.50 pounds BMI:     0.93 BSA:     3.19 Temp:     97.7 degrees F oral Pulse rate:   71 / minute Pulse rhythm:   regular Resp:     16 per minute BP sitting:   110 / 76  (left arm) Cuff size:   regular  Vitals Entered By: Levon Hedger (May 30, 2010 9:40 AM) CC: hospital follow up [facility]....medication has been giving him some constipation having a hard time making a bowel movement, CHF Management, Hypertension Management, Lipid Management Is Patient Diabetic? Yes Pain Assessment Patient in pain? no      CBG Result 202 CBG Device ID B  Does patient need assistance? Functional Status Self care Ambulation Normal   CC:  hospital follow up [facility]....medication has been giving him some constipation having a hard time making a bowel movement, CHF Management, Hypertension Management, and Lipid Management.  History of Present Illness:  Pt into the office to establish care. Pt was previously a pt at [facility] - no visit in over 1 year.  Recent hospitalzation from 05/13/2010 to 05/17/2010 Pt presented on the day of hospital admission with 3 week prior history of shortness of breath.  He was having trouble breathing and he was sitting in a chair to sleep.  He was also getting increasingly short of breath over the course of the 3 weeks.  1.  Acute systolic congestive heart failure - Seen by cardiomypathy while in hospital and he has an appt this week for labs.  He will then get a lab appt rescheduled at [facility]. Admission BNP = 319  2.  Hypertension - stable during hospital stay  3.  Uncontrolled diabetes mellitus type 2 -  Previously taking Glucotrol 5mg  prior to hospital admission Hgba1c - 11.6 Pt was started on Insulin 70/30 with 15 units two times a day.  Pt did not bring the insulin with him but he is taking twice daily.  He is checking blood  sugar three times per day. Morning before breakfast - 230 (from recall) Lunch - 190 (from recall) Dinner - over 200 (from recall)  4.  Hyperlipidemia - stable - on statin therapy  Social - employed as a Curator  CHF History:      Daily weights are not being checked.  He understands fluid management and sodium restriction and is following this regimen.  The patient expresses understanding of the treatment plan and his medications.  He admits to being compliant with his medications.  ADL's are being done without symptoms or restrictions.  The patient has not been enrolled in the CHF Eduction Program.  He denies any side effects from his medications.    Diabetes Management History:      The patient is a 45 years old male who comes in for evaluation of DM Type 2.  He has not been enrolled in the "Diabetic Education Program".  He notes lack of understanding of dietary principles but states that he is following his diet appropriately.  No sensory loss is reported.  Self foot exams are not being performed.  He is checking home blood sugars.  He says that he is not exercising regularly.        Hypoglycemic symptoms are not occurring.  Symptoms which suggest diabetic complications include vision problems.    Hypertension History:      He denies headache, chest pain, and palpitations.  He notes the following problems with antihypertensive medication side effects: constipation.        Positive major cardiovascular risk factors include diabetes, hyperlipidemia, hypertension, and family history for ischemic heart disease (females less than 67 years old).  Negative major cardiovascular risk factors include male age less than 75 years old and non-tobacco-user status.        Positive history for target organ damage include cardiac end organ damage (either CHF or LVH).  Further assessment for target organ damage reveals no history of ASHD, stroke/TIA, peripheral vascular disease, or renal insufficiency.      Lipid Management History:      Positive NCEP/ATP III risk factors include diabetes, HDL cholesterol less than 40, family history for ischemic heart disease (females less than 79 years old), and hypertension.  Negative NCEP/ATP III risk factors include male age less than 81 years old, non-tobacco-user status, no ASHD (atherosclerotic heart disease), no prior stroke/TIA, no peripheral vascular disease, and no history of aortic aneurysm.        The patient states that he knows about the "Therapeutic Lifestyle Change" diet.  His compliance with the TLC diet is good.  The patient expresses understanding of adjunctive measures for cholesterol lowering.  Adjunctive measures started by the patient include ASA.  He expresses no side effects from his lipid-lowering medication.  The patient denies any symptoms to suggest myopathy or liver disease.       Habits & Providers  Alcohol-Tobacco-Diet     Alcohol drinks/day: 0     Tobacco Status: never  Exercise-Depression-Behavior     Does Patient Exercise: yes     Exercise Counseling: to improve exercise regimen     Drug Use: never  Current Medications (verified): 1)  Carvedilol 6.25 Mg Tabs (Carvedilol) .... One Tablet By Mouth Two Times A Day 2)  Klor-Con M20 20 Meq Cr-Tabs (Potassium Chloride Crys Cr) .... One Tablet By Mouth Daily 3)  Aspir-Low 81 Mg Tbec (Aspirin) .... Take One Tablet By Mouth Daily 4)  Furosemide 40 Mg Tabs (Furosemide) .... One Tablet By Mouth Daily 5)  Lisinopril 20 Mg Tabs (Lisinopril) .... One Tablet By Mouth Daily For Blood Pressure 6)  Spironolactone 25 Mg Tabs (Spironolactone) .... One Tablet By Mouth Daily 7)  Pravastatin Sodium 40 Mg Tabs (Pravastatin Sodium) .... One Tablet By Mouth Nightly For Cholesterol 8)  Relion Ultima Test  Strp (Glucose Blood) 9)  Novolog Mix 70/30 Flexpen 70-30 % Susp (Insulin Aspart Prot & Aspart) .Marland Kitchen.. 15 Units Subcutaneously Two Times A Day  Allergies (verified): No Known Drug  Allergies  Past History:  Past Surgical History: Cholecystectomy appendectomy  Family History: mother - breast cancer, diabetes, gastric cancer (deceased) maternal grandmother - breast cancer father - htn  Social History: 2 children tobacco - none ETOH - none Drug - none married - wife in nursing home (s/p CVA and diabetes) Smoking Status:  never Drug Use:  never Does Patient Exercise:  yes  Review of Systems General:  Denies fever. CV:  Denies chest pain or discomfort, fatigue, and swelling of feet. Resp:  Denies cough. GI:  Complains of constipation. Endo:  Complains of excessive thirst and excessive urination; improved since hospital D/C.  Physical Exam  General:  alert.   Head:  normocephalic.   Mouth:  missing bottom incisors Lungs:  normal breath sounds.  Heart:  normal rate and regular rhythm.   Abdomen:  normal bowel sounds.   Msk:  up to the exam table Neurologic:  alert & oriented X3 and gait normal.   Skin:  color normal.   Psych:  Oriented X3.     Impression & Recommendations:  Problem # 1:  DIABETES MELLITUS, UNCONTROLLED (ICD-250.02) continue current meds advised pt to continue to check BS three times a day and record values pt is making great progress with diet and exercise -he has been reading and is educating himself about diabetes His updated medication list for this problem includes:    Aspir-low 81 Mg Tbec (Aspirin) .Marland Kitchen... Take one tablet by mouth daily    Lisinopril 20 Mg Tabs (Lisinopril) ..... One tablet by mouth daily for blood pressure    Novolog Mix 70/30 Flexpen 70-30 % Susp (Insulin aspart prot & aspart) .Marland KitchenMarland KitchenMarland KitchenMarland Kitchen 15 units subcutaneously two times a day  Orders: Capillary Blood Glucose/CBG (29562) UA Dipstick w/o Micro (manual) (13086) T-Urine Microalbumin w/creat. ratio [phone])  Problem # 2:  HYPERTENSION, BENIGN ESSENTIAL (ICD-401.1) stable  reviewed DASH diet His updated medication list for this problem includes:     Carvedilol 6.25 Mg Tabs (Carvedilol) ..... One tablet by mouth two times a day    Furosemide 40 Mg Tabs (Furosemide) ..... One tablet by mouth daily    Lisinopril 20 Mg Tabs (Lisinopril) ..... One tablet by mouth daily for blood pressure    Spironolactone 25 Mg Tabs (Spironolactone) ..... One tablet by mouth daily  Problem # 3:  CHF (ICD-428.0) pt to go to cardiology this week for labs His updated medication list for this problem includes:    Carvedilol 6.25 Mg Tabs (Carvedilol) ..... One tablet by mouth two times a day    Aspir-low 81 Mg Tbec (Aspirin) .Marland Kitchen... Take one tablet by mouth daily    Furosemide 40 Mg Tabs (Furosemide) ..... One tablet by mouth daily    Lisinopril 20 Mg Tabs (Lisinopril) ..... One tablet by mouth daily for blood pressure    Spironolactone 25 Mg Tabs (Spironolactone) ..... One tablet by mouth daily  Problem # 4:  HYPERLIPIDEMIA (ICD-272.4) continue current statin His updated medication list for this problem includes:    Pravastatin Sodium 40 Mg Tabs (Pravastatin sodium) ..... One tablet by mouth nightly for cholesterol  Complete Medication List: 1)  Carvedilol 6.25 Mg Tabs (Carvedilol) .... One tablet by mouth two times a day 2)  Klor-con M20 20 Meq Cr-tabs (Potassium chloride crys cr) .... One tablet by mouth daily 3)  Aspir-low 81 Mg Tbec (Aspirin) .... Take one tablet by mouth daily 4)  Furosemide 40 Mg Tabs (Furosemide) .... One tablet by mouth daily 5)  Lisinopril 20 Mg Tabs (Lisinopril) .... One tablet by mouth daily for blood pressure 6)  Spironolactone 25 Mg Tabs (Spironolactone) .... One tablet by mouth daily 7)  Pravastatin Sodium 40 Mg Tabs (Pravastatin sodium) .... One tablet by mouth nightly for cholesterol 8)  Relion Ultima Test Strp (Glucose blood) 9)  Novolog Mix 70/30 Flexpen 70-30 % Susp (Insulin aspart prot & aspart) .Marland Kitchen.. 15 units subcutaneously two times a day  CHF Assessment/Plan:      The patient's current weight is 70.50 pounds.  His last  cardiac ejection fraction was 25 % on 05/13/2010.    Prior Cardiac Test Results:  CXR Report:    Date:     05/13/2010    Results:   Prominent interstitial densities concerning for mild edema  Echocardiogram Report:  Date:     05/13/2010    Results:   Left ventricular EF 25-30%. Diffuse hypokinesis.  Systolic function severely reduced  Cardiac Cath Report:    Date:     05/15/2010    Results:   No angiographic evidence of coronary artery disease severe global left ventricular systolic disfuction secondary to nonischemic cardiomyopathy. mild elevation of pulmonary artery pressures   Diabetes Management Assessment/Plan:      The following lipid goals have been established for the patient: Total cholesterol goal of 200; LDL cholesterol goal of 70; HDL cholesterol goal of 40; Triglyceride goal of 150.  His blood pressure goal is < 130/80.    Hypertension Assessment/Plan:      The patient's hypertensive risk group is category C: Target organ damage and/or diabetes.  His calculated 10 year risk of coronary heart disease is 11 %.  Today's blood pressure is 110/76.  His blood pressure goal is < 130/80.  Lipid Assessment/Plan:      Based on NCEP/ATP III, the patient's risk factor category is "history of diabetes".  The patient's lipid goals are as follows: Total cholesterol goal is 200; LDL cholesterol goal is 70; HDL cholesterol goal is 40; Triglyceride goal is 150.  His LDL cholesterol goal has not been met.    Patient Instructions: 1)  Continue to take your medications as ordered 2)  Follow up in this office in 1 month for diabetes. 3)  Bring medications and blood sugar log with you. 4)  Will need pneumovax 5)  Call this office back about your prescriptions before you complete your supply.  Laboratory Results   Urine Tests  Date/Time Received: May 30, 2010 10:12 AM   Routine Urinalysis   Color: lt. yellow Appearance: Clear Glucose: negative   (Normal Range: Negative) Bilirubin:  negative   (Normal Range: Negative) Ketone: negative   (Normal Range: Negative) Spec. Gravity: 1.015   (Normal Range: 1.003-1.035) Blood: negative   (Normal Range: Negative) pH: 5.0   (Normal Range: 5.0-8.0) Protein: negative   (Normal Range: Negative) Urobilinogen: 0.2   (Normal Range: 0-1) Nitrite: negative   (Normal Range: Negative) Leukocyte Esterace: negative   (Normal Range: Negative)     Blood Tests     CBG Random:: 202

## 2011-01-29 NOTE — Assessment & Plan Note (Signed)
Summary: PER CHECK OUT/SF   Primary Provider:  [facility]   History of Present Illness: Jared Hart is a 45 y/o male with HTN and DM2. Admitted in May 2011 for dyspnea and CP.  Cath with normal coronaries and EF 25% and marked volume overload.  RHC data:  Right atrial pressure 14.  Right ventricular pressure 47/12.  Right ventricular end-diastolic pressure 14.  Pulmonary artery pressure 54/20 (39).  Pulmonary capillary wedge pressure 26.Cardiac output 5.76 L per minute.  Cardiac index 2.52 L per minute per meter squared.  Pulmonary artery saturation 65%.  Diuresed.   At last visit carvedilol increased to 12.5 two times a day. Has tolerated well. Lifting weights and walking 2-3 miles every other day. No CP or SOB.  No swelling. Compliant with all meds. no lightheadedness. Not weighing every day but weight. Blood sugars doing well. Now off insulin.   Current Medications (verified): 1)  Carvedilol 12.5 Mg Tabs (Carvedilol) .... One Twice A Day 2)  Klor-Con M20 20 Meq Cr-Tabs (Potassium Chloride Crys Cr) .... One Tablet By Mouth Daily 3)  Aspir-Low 81 Mg Tbec (Aspirin) .... Take One Tablet By Mouth Daily 4)  Furosemide 40 Mg Tabs (Furosemide) .... One Tablet By Mouth Daily 5)  Lisinopril 20 Mg Tabs (Lisinopril) .... One Tablet By Mouth Daily For Blood Pressure 6)  Spironolactone 25 Mg Tabs (Spironolactone) .... One Tablet By Mouth Daily 7)  Pravastatin Sodium 40 Mg Tabs (Pravastatin Sodium) .... One Tablet By Mouth Nightly For Cholesterol 8)  Relion Ultima Test  Strp (Glucose Blood) 9)  Metformin Hcl 500 Mg Tabs (Metformin Hcl) .... Two Tablets By Mouth Two Times A Day  Allergies (verified): No Known Drug Allergies  Past History:  Past Medical History: Last updated: 06/01/2010 Current Problems:  HYPERLIPIDEMIA (ICD-272.4) HYPERTENSION, BENIGN ESSENTIAL (ICD-401.1) CARDIOMYOPATHY (ICD-425.4)    --onset 5/11: EF 25% normal coronaries CHF (ICD-428.0) Hx of COLON CANCER  (ICD-153.9) DIABETES MELLITUS, UNCONTROLLED (ICD-250.02)    Review of Systems       As per HPI and past medical history; otherwise all systems negative.   Vital Signs:  Patient profile:   45 year old male Height:      70.5 inches Weight:      229 pounds BMI:     32.51 Pulse rate:   82 / minute Resp:     16 per minute BP sitting:   152 / 84  (left arm)  Vitals Entered By: Marrion Coy, CNA (October 01, 2010 3:59 PM)  Nutrition Counseling: Patient's BMI is greater than 25 and therefore counseled on weight management options.  Physical Exam  General:  well appearing. no resp difficulty HEENT: normal Neck: supple. no JVD. Carotids 2+ bilat; no bruits. No lymphadenopathy or thryomegaly appreciated. Cor: PMI nondisplaced. Regular rate & rhythm. No rubs, gallops, murmur. Lungs: clear Abdomen: soft, nontender, nondistended. No hepatosplenomegaly. No bruits or masses. Good bowel sounds. Extremities: no cyanosis, clubbing, rash, edema Neuro: alert & orientedx3, cranial nerves grossly intact. moves all 4 extremities w/o difficulty. affect pleasant   Impression & Recommendations:  Problem # 1:  SYSTOLIC HEART FAILURE, CHRONIC (ICD-428.22) Doing very well. NYHA class I. Volume status looks good. Will titrate Coreg to 18.75 two times a day.See back in 6 weeks to continue to  titrate meds. Will check echo at next visit. Suspect EF may have recovered.   Other Orders: EKG w/ Interpretation (93000)  Patient Instructions: 1)  Increase Carvedilol to 12.5mg  two times a day  2)  Follow  up in 6-8 weeks Prescriptions: CARVEDILOL 12.5 MG TABS (CARVEDILOL) Take 1 tablet by mouth two times a day  #60 x 6   Entered by:   Meredith Staggers, RN   Authorized by:   Dolores Patty, MD, [facility]   Signed by:   Meredith Staggers, RN on 10/01/2010   Method used:   Electronically to        [facility] [phone]* (retail)       [address]       [place], [place]  [address]       Ph: [phone]        Fax: [phone]   RxID:   [id number]

## 2011-01-31 NOTE — Progress Notes (Signed)
Summary: Query:  Refill metformin?  Phone Note Outgoing Call   Summary of Call: Last seen 07/2010 - refill metformin per protocol? Initial call taken by: Dutch Quint RN,  January 11, 2011 2:08 PM  Follow-up for Phone Call        yes, ok to refill Follow-up by: Lehman Prom FNP,  January 11, 2011 3:02 PM  Additional Follow-up for Phone Call Additional follow up Details #1::        Noted.  Refill completed.  Dutch Quint RN  January 11, 2011 3:48 PM

## 2011-02-06 NOTE — Assessment & Plan Note (Signed)
Summary: per check out/sf   Visit Type:  Follow-up Primary Provider:  [facility]   History of Present Illness: Jared Hart is a 45 y/o male with HTN and DM2. Admitted in May 2011 for dyspnea and CP.  Cath with normal coronaries and EF 25% and marked volume overload.  RHC data:  Right atrial pressure 14.  Right ventricular pressure 47/12.  Right ventricular end-diastolic pressure 14.  Pulmonary artery pressure 54/20 (39).  Pulmonary capillary wedge pressure 26.Cardiac output 5.76 L per minute.  Cardiac index 2.52 L per minute per meter squared.  Pulmonary artery saturation 65%.  Diuresed.   At last visit carvedilol increased to 18.72 two times a day but we didn't write on his bottle so he forgot to do it. Continues to bevery active. Lifting weights and walkingfreqquently. No CP or SOB.  No swelling. Compliant with all meds. no lightheadedness. Not weighing every day but weight stable.   Current Medications (verified): 1)  Carvedilol 12.5 Mg Tabs (Carvedilol) .... Take 1 Tablet By Mouth Two Times A Day 2)  Klor-Con M20 20 Meq Cr-Tabs (Potassium Chloride Crys Cr) .... One Tablet By Mouth Daily 3)  Aspir-Low 81 Mg Tbec (Aspirin) .... Take One Tablet By Mouth Daily 4)  Furosemide 40 Mg Tabs (Furosemide) .... One Tablet By Mouth Daily 5)  Lisinopril 20 Mg Tabs (Lisinopril) .... One Tablet By Mouth Daily For Blood Pressure 6)  Spironolactone 25 Mg Tabs (Spironolactone) .... One Tablet By Mouth Daily 7)  Pravastatin Sodium 40 Mg Tabs (Pravastatin Sodium) .... One Tablet By Mouth Nightly For Cholesterol 8)  Relion Ultima Test  Strp (Glucose Blood) 9)  Metformin Hcl 500 Mg Tabs (Metformin Hcl) .... Two Tablets By Mouth Two Times A Day  Allergies (verified): No Known Drug Allergies  Past History:  Past Medical History: Last updated: 06/01/2010 Current Problems:  HYPERLIPIDEMIA (ICD-272.4) HYPERTENSION, BENIGN ESSENTIAL (ICD-401.1) CARDIOMYOPATHY (ICD-425.4)    --onset 5/11: EF 25% normal  coronaries CHF (ICD-428.0) Hx of COLON CANCER (ICD-153.9) DIABETES MELLITUS, UNCONTROLLED (ICD-250.02)    Review of Systems       As per HPI and past medical history; otherwise all systems negative.   Vital Signs:  Patient profile:   45 year old male Height:      70.5 inches Weight:      227 pounds BMI:     32.23 Pulse rate:   77 / minute BP sitting:   136 / 88  (left arm)  Vitals Entered By: Laurance Flatten CMA (January 15, 2011 10:32 AM)  Physical Exam  General:  well appearing. no resp difficulty HEENT: normal Neck: supple. no JVD. Carotids 2+ bilat; no bruits. No lymphadenopathy or thryomegaly appreciated. Cor: PMI nondisplaced. Regular rate & rhythm. No rubs, gallops, murmur. Lungs: clear Abdomen: soft, nontender, nondistended. No hepatosplenomegaly. No bruits or masses. Good bowel sounds. Extremities: no cyanosis, clubbing, rash, edema Neuro: alert & orientedx3, cranial nerves grossly intact. moves all 4 extremities w/o difficulty. affect pleasant   Impression & Recommendations:  Problem # 1:  SYSTOLIC HEART FAILURE, CHRONIC (ICD-428.22) Doing very well. NYHA class I. Volume status looks good. Will titrate Coreg to 18.75 two times a day. Suspect EF may have recovered. Echo next week.   Other Orders: EKG w/ Interpretation (93000) Echocardiogram (Echo)  Patient Instructions: 1)  Your physician recommends that you schedule a follow-up appointment in: 3 months with Dr. Gala Romney 2)  Your physician has recommended you make the following change in your medication: Coreg 3)  Your physician has requested  that you have an echocardiogram.  Echocardiography is a painless test that uses sound waves to create images of your heart. It provides your doctor with information about the size and shape of your heart and how well your heart's chambers and valves are working.  This procedure takes approximately one hour. There are no restrictions for this  procedure. Prescriptions: CARVEDILOL 12.5 MG TABS (CARVEDILOL) Take 1 and 1/2  tablets  by mouth two times a day  #90 x 11   Entered by:   Lisabeth Devoid RN   Authorized by:   Dolores Patty, MD, [facility]   Signed by:   Lisabeth Devoid RN on 01/15/2011   Method used:   Electronically to        [facility] [phone]* (retail)       [address]       [place], [place]  [address]       Ph: [phone]       Fax: [phone]   RxID:   [id number]

## 2011-02-25 ENCOUNTER — Telehealth (INDEPENDENT_AMBULATORY_CARE_PROVIDER_SITE_OTHER): Payer: Self-pay | Admitting: Nurse Practitioner

## 2011-03-07 NOTE — Progress Notes (Signed)
Summary: Query:  Refill spironolactone?  Phone Note Outgoing Call   Summary of Call: Last seen 07/2010.  Refill spironolactone per protocol? Initial call taken by: Dutch Quint RN,  February 25, 2011 12:53 PM  Follow-up for Phone Call        yes, ok to fill. looks like pt has been to cardiology. try to contact pt and advise he needs an appt with this office Follow-up by: Lehman Prom FNP,  February 26, 2011 8:14 AM  Additional Follow-up for Phone Call Additional follow up Details #1::        Noted.  Refill completed.  Pt. notified -- appt. with provider 03/12/11.  Dutch Quint RN  February 26, 2011 9:13 AM     Prescriptions: SPIRONOLACTONE 25 MG TABS (SPIRONOLACTONE) One tablet by mouth daily  #30 x 3   Entered by:   Dutch Quint RN   Authorized by:   Lehman Prom FNP   Signed by:   Dutch Quint RN on 02/26/2011   Method used:   Electronically to        [facility] [phone]* (retail)       [address]       [place], [place]  [address]       Ph: [phone]       Fax: [phone]   RxID:   [id number]

## 2011-03-12 ENCOUNTER — Encounter (INDEPENDENT_AMBULATORY_CARE_PROVIDER_SITE_OTHER): Payer: Self-pay | Admitting: Nurse Practitioner

## 2011-03-12 ENCOUNTER — Encounter: Payer: Self-pay | Admitting: Nurse Practitioner

## 2011-03-12 LAB — CONVERTED CEMR LAB: Blood Glucose, Fingerstick: 173

## 2011-03-13 LAB — CONVERTED CEMR LAB: Hgb A1c MFr Bld: 7 % — ABNORMAL HIGH (ref <5.7)

## 2011-03-18 LAB — DIFFERENTIAL
Basophils Absolute: 0 10*3/uL (ref 0.0–0.1)
Basophils Absolute: 0.1 10*3/uL (ref 0.0–0.1)
Basophils Relative: 1 % (ref 0–1)
Eosinophils Absolute: 0.1 10*3/uL (ref 0.0–0.7)
Eosinophils Relative: 1 % (ref 0–5)
Eosinophils Relative: 1 % (ref 0–5)
Eosinophils Relative: 2 % (ref 0–5)
Lymphocytes Relative: 18 % (ref 12–46)
Lymphocytes Relative: 22 % (ref 12–46)
Lymphocytes Relative: 33 % (ref 12–46)
Lymphs Abs: 1.8 10*3/uL (ref 0.7–4.0)
Lymphs Abs: 4 10*3/uL (ref 0.7–4.0)
Monocytes Absolute: 0.5 10*3/uL (ref 0.1–1.0)
Monocytes Absolute: 0.6 10*3/uL (ref 0.1–1.0)
Monocytes Absolute: 0.9 10*3/uL (ref 0.1–1.0)
Monocytes Relative: 6 % (ref 3–12)
Monocytes Relative: 7 % (ref 3–12)
Monocytes Relative: 8 % (ref 3–12)
Neutro Abs: 5.6 10*3/uL (ref 1.7–7.7)
Neutro Abs: 6.8 10*3/uL (ref 1.7–7.7)
Neutro Abs: 7.6 10*3/uL (ref 1.7–7.7)
Neutrophils Relative %: 69 % (ref 43–77)

## 2011-03-18 LAB — DRUGS OF ABUSE SCREEN W/O ALC, ROUTINE URINE
Cocaine Metabolites: NEGATIVE
Creatinine,U: 12.7 mg/dL
Methadone: NEGATIVE
Opiate Screen, Urine: NEGATIVE
Phencyclidine (PCP): NEGATIVE

## 2011-03-18 LAB — BASIC METABOLIC PANEL
BUN: 10 mg/dL (ref 6–23)
BUN: 15 mg/dL (ref 6–23)
BUN: 9 mg/dL (ref 6–23)
CO2: 27 mEq/L (ref 19–32)
CO2: 31 mEq/L (ref 19–32)
CO2: 31 mEq/L (ref 19–32)
CO2: 33 mEq/L — ABNORMAL HIGH (ref 19–32)
Calcium: 9.3 mg/dL (ref 8.4–10.5)
Calcium: 9.3 mg/dL (ref 8.4–10.5)
Chloride: 100 mEq/L (ref 96–112)
Chloride: 100 mEq/L (ref 96–112)
Chloride: 101 mEq/L (ref 96–112)
Chloride: 98 mEq/L (ref 96–112)
Creatinine, Ser: 1.06 mg/dL (ref 0.4–1.5)
GFR calc Af Amer: >60 mL/min (ref >60)
GFR calc Af Amer: >60 mL/min (ref >60)
GFR calc non Af Amer: >60 mL/min (ref >60)
GFR calc non Af Amer: >60 mL/min (ref >60)
Glucose, Bld: 205 mg/dL — ABNORMAL HIGH (ref 70–99)
Glucose, Bld: 238 mg/dL — ABNORMAL HIGH (ref 70–99)
Glucose, Bld: 262 mg/dL — ABNORMAL HIGH (ref 70–99)
Glucose, Bld: 314 mg/dL — ABNORMAL HIGH (ref 70–99)
Potassium: 3.3 mEq/L — ABNORMAL LOW (ref 3.5–5.1)
Potassium: 3.7 mEq/L (ref 3.5–5.1)
Potassium: 4.7 mEq/L (ref 3.5–5.1)
Sodium: 135 mEq/L (ref 135–145)
Sodium: 140 mEq/L (ref 135–145)

## 2011-03-18 LAB — CBC
HCT: 40.9 % (ref 39.0–52.0)
HCT: 41.7 % (ref 39.0–52.0)
HCT: 41.9 % (ref 39.0–52.0)
HCT: 43 % (ref 39.0–52.0)
HCT: 45 % (ref 39.0–52.0)
HCT: 46 % (ref 39.0–52.0)
Hemoglobin: 13.8 g/dL (ref 13.0–17.0)
Hemoglobin: 14.4 g/dL (ref 13.0–17.0)
Hemoglobin: 14.6 g/dL (ref 13.0–17.0)
Hemoglobin: 15.3 g/dL (ref 13.0–17.0)
Hemoglobin: 15.5 g/dL (ref 13.0–17.0)
MCHC: 33.7 g/dL (ref 30.0–36.0)
MCHC: 33.8 g/dL (ref 30.0–36.0)
MCHC: 34 g/dL (ref 30.0–36.0)
MCHC: 34.2 g/dL (ref 30.0–36.0)
MCV: 85.4 fL (ref 78.0–100.0)
MCV: 85.4 fL (ref 78.0–100.0)
MCV: 85.4 fL (ref 78.0–100.0)
MCV: 85.5 fL (ref 78.0–100.0)
MCV: 86.5 fL (ref 78.0–100.0)
Platelets: 227 10*3/uL (ref 150–400)
Platelets: 250 10*3/uL (ref 150–400)
Platelets: 265 10*3/uL (ref 150–400)
RBC: 4.79 MIL/uL (ref 4.22–5.81)
RBC: 4.91 MIL/uL (ref 4.22–5.81)
RBC: 5.04 MIL/uL (ref 4.22–5.81)
RBC: 5.28 MIL/uL (ref 4.22–5.81)
RBC: 5.37 MIL/uL (ref 4.22–5.81)
RDW: 13.4 % (ref 11.5–15.5)
RDW: 13.6 % (ref 11.5–15.5)
RDW: 13.6 % (ref 11.5–15.5)
RDW: 13.8 % (ref 11.5–15.5)
WBC: 12 10*3/uL — ABNORMAL HIGH (ref 4.0–10.5)
WBC: 7.8 10*3/uL (ref 4.0–10.5)
WBC: 8.1 10*3/uL (ref 4.0–10.5)

## 2011-03-18 LAB — URINALYSIS, MICROSCOPIC ONLY
Bilirubin Urine: NEGATIVE
Glucose, UA: 250 mg/dL — AB
Hgb urine dipstick: NEGATIVE
Ketones, ur: NEGATIVE mg/dL
Leukocytes, UA: NEGATIVE
Nitrite: NEGATIVE
Protein, ur: NEGATIVE mg/dL
Specific Gravity, Urine: 1.006 (ref 1.005–1.030)
Urine-Other: NONE SEEN
Urobilinogen, UA: 0.2 mg/dL (ref 0.0–1.0)
pH: 6.5 (ref 5.0–8.0)

## 2011-03-18 LAB — APTT: aPTT: 28 seconds (ref 24–37)

## 2011-03-18 LAB — POCT I-STAT, CHEM 8
BUN: 7 mg/dL (ref 6–23)
Calcium, Ion: 1.06 mmol/L — ABNORMAL LOW (ref 1.12–1.32)
Chloride: 102 mEq/L (ref 96–112)
Creatinine, Ser: 0.7 mg/dL (ref 0.4–1.5)
Glucose, Bld: 355 mg/dL — ABNORMAL HIGH (ref 70–99)
HCT: 45 % (ref 39.0–52.0)
Hemoglobin: 15.3 g/dL (ref 13.0–17.0)
Potassium: 4.1 mEq/L (ref 3.5–5.1)
Sodium: 137 mEq/L (ref 135–145)
TCO2: 24 mmol/L (ref 0–100)

## 2011-03-18 LAB — PROTIME-INR
INR: 1.08 (ref 0.00–1.49)
Prothrombin Time: 13.9 seconds (ref 11.6–15.2)

## 2011-03-18 LAB — GLUCOSE, CAPILLARY
Glucose-Capillary: 154 mg/dL — ABNORMAL HIGH (ref 70–99)
Glucose-Capillary: 155 mg/dL — ABNORMAL HIGH (ref 70–99)
Glucose-Capillary: 208 mg/dL — ABNORMAL HIGH (ref 70–99)
Glucose-Capillary: 224 mg/dL — ABNORMAL HIGH (ref 70–99)
Glucose-Capillary: 234 mg/dL — ABNORMAL HIGH (ref 70–99)
Glucose-Capillary: 292 mg/dL — ABNORMAL HIGH (ref 70–99)
Glucose-Capillary: 372 mg/dL — ABNORMAL HIGH (ref 70–99)

## 2011-03-18 LAB — COMPREHENSIVE METABOLIC PANEL
ALT: 14 U/L (ref 0–53)
AST: 24 U/L (ref 0–37)
Albumin: 3.9 g/dL (ref 3.5–5.2)
Alkaline Phosphatase: 83 U/L (ref 39–117)
BUN: 7 mg/dL (ref 6–23)
CO2: 27 mEq/L (ref 19–32)
Calcium: 8.8 mg/dL (ref 8.4–10.5)
Chloride: 99 mEq/L (ref 96–112)
Creatinine, Ser: 0.9 mg/dL (ref 0.4–1.5)
GFR calc Af Amer: >60 mL/min (ref >60)
GFR calc non Af Amer: >60 mL/min (ref >60)
Glucose, Bld: 344 mg/dL — ABNORMAL HIGH (ref 70–99)
Potassium: 3.6 mEq/L (ref 3.5–5.1)
Sodium: 133 mEq/L — ABNORMAL LOW (ref 135–145)
Total Bilirubin: 0.9 mg/dL (ref 0.3–1.2)
Total Protein: 6.9 g/dL (ref 6.0–8.3)

## 2011-03-18 LAB — LIPID PANEL
HDL: 31 mg/dL — ABNORMAL LOW (ref >39)
HDL: 32 mg/dL — ABNORMAL LOW (ref >39)
Total CHOL/HDL Ratio: 6 RATIO
Triglycerides: 143 mg/dL (ref <150)
Triglycerides: 146 mg/dL (ref <150)
VLDL: 29 mg/dL (ref 0–40)

## 2011-03-18 LAB — POCT CARDIAC MARKERS
CKMB, poc: 1.4 ng/mL (ref 1.0–8.0)
Myoglobin, poc: 65.2 ng/mL (ref 12–200)
Troponin i, poc: <0.05 ng/mL (ref 0.00–0.09)

## 2011-03-18 LAB — HEMOGLOBIN A1C
Hgb A1c MFr Bld: 11.6 % — ABNORMAL HIGH (ref <5.7)
Mean Plasma Glucose: 286 mg/dL — ABNORMAL HIGH (ref <117)

## 2011-03-18 LAB — POCT I-STAT 3, ART BLOOD GAS (G3+)
Bicarbonate: 28.5 mEq/L — ABNORMAL HIGH (ref 20.0–24.0)
O2 Saturation: 92 %
TCO2: 30 mmol/L (ref 0–100)
pH, Arterial: 7.357 (ref 7.350–7.450)

## 2011-03-18 LAB — URINE CULTURE: Colony Count: NO GROWTH

## 2011-03-18 LAB — HEPARIN LEVEL (UNFRACTIONATED)
Heparin Unfractionated: 0.15 IU/mL — ABNORMAL LOW (ref 0.30–0.70)
Heparin Unfractionated: <0.1 IU/mL — ABNORMAL LOW (ref 0.30–0.70)

## 2011-03-18 LAB — POCT I-STAT 3, VENOUS BLOOD GAS (G3P V)
Acid-Base Excess: 1 mmol/L (ref 0.0–2.0)
Bicarbonate: 29.4 mEq/L — ABNORMAL HIGH (ref 20.0–24.0)
O2 Saturation: 65 %
pCO2, Ven: 62.6 mmHg — ABNORMAL HIGH (ref 45.0–50.0)
pO2, Ven: 39 mmHg (ref 30.0–45.0)

## 2011-03-18 LAB — BRAIN NATRIURETIC PEPTIDE
Pro B Natriuretic peptide (BNP): 207 pg/mL — ABNORMAL HIGH (ref 0.0–100.0)
Pro B Natriuretic peptide (BNP): 319 pg/mL — ABNORMAL HIGH (ref 0.0–100.0)

## 2011-03-18 LAB — CARDIAC PANEL(CRET KIN+CKTOT+MB+TROPI)
CK, MB: 1.9 ng/mL (ref 0.3–4.0)
CK, MB: 2.7 ng/mL (ref 0.3–4.0)
CK, MB: 3.2 ng/mL (ref 0.3–4.0)
Relative Index: 2 (ref 0.0–2.5)
Relative Index: INVALID (ref 0.0–2.5)
Total CK: 88 U/L (ref 7–232)
Troponin I: 0.06 ng/mL (ref 0.00–0.06)

## 2011-03-18 LAB — TROPONIN I: Troponin I: 0.05 ng/mL (ref 0.00–0.06)

## 2011-03-18 LAB — CK TOTAL AND CKMB (NOT AT ARMC)
CK, MB: 2.9 ng/mL (ref 0.3–4.0)
Relative Index: INVALID (ref 0.0–2.5)
Total CK: 84 U/L (ref 7–232)

## 2011-03-18 LAB — MAGNESIUM
Magnesium: 1.9 mg/dL (ref 1.5–2.5)
Magnesium: 2.1 mg/dL (ref 1.5–2.5)

## 2011-03-18 LAB — LIPASE, BLOOD: Lipase: 25 U/L (ref 11–59)

## 2011-03-18 LAB — TSH: TSH: 0.679 u[IU]/mL (ref 0.350–4.500)

## 2011-03-19 NOTE — Assessment & Plan Note (Signed)
Summary: Diabetes   Vital Signs:  Patient profile:   45 year old male Weight:      233.0 pounds BMI:     33.08 Temp:     98.0 degrees F oral Pulse rate:   76 / minute Pulse rhythm:   regular Resp:     20 per minute BP sitting:   145 / 96  (left arm) Cuff size:   regular  Vitals Entered By: Levon Hedger (March 12, 2011 11:11 AM)  Nutrition Counseling: Patient's BMI is greater than 25 and therefore counseled on weight management options. CC: follow-up visit, Hypertension Management, Lipid Management Is Patient Diabetic? Yes Pain Assessment Patient in pain? no      CBG Result 173  Does patient need assistance? Functional Status Self care Ambulation Normal   Primary Care Provider:  [facility]  CC:  follow-up visit, Hypertension Management, and Lipid Management.  History of Present Illness:  Pt into the office for routine f/u on diabetes  Pt today with all his medications  Diabetes Management History:      The patient is a 45 years old male who comes in for evaluation of Type 2 Diabetes Mellitus.  He has not been enrolled in the "Diabetic Education Program".  He states lack of understanding of dietary principles and is not following his diet appropriately.  No sensory loss is reported.  Self foot exams are not being performed.  He is checking home blood sugars.  He says that he is exercising.        Hypoglycemic symptoms are not occurring.  No hyperglycemic symptoms are reported.  Other comments include: pt is taking meds as ordered.        No changes have been made to his treatment plan since last visit.    Hypertension History:      He denies headache, chest pain, and palpitations.        Positive major cardiovascular risk factors include diabetes, hyperlipidemia, hypertension, and family history for ischemic heart disease (females less than 87 years old).  Negative major cardiovascular risk factors include male age less than 24 years old and non-tobacco-user status.         Positive history for target organ damage include cardiac end organ damage (either CHF or LVH).  Further assessment for target organ damage reveals no history of ASHD, stroke/TIA, peripheral vascular disease, renal insufficiency, or hypertensive retinopathy.    Lipid Management History:      Positive NCEP/ATP III risk factors include diabetes, HDL cholesterol less than 40, family history for ischemic heart disease (females less than 77 years old), and hypertension.  Negative NCEP/ATP III risk factors include male age less than 68 years old, non-tobacco-user status, no ASHD (atherosclerotic heart disease), no prior stroke/TIA, no peripheral vascular disease, and no history of aortic aneurysm.        The patient states that he knows about the "Therapeutic Lifestyle Change" diet.  His compliance with the TLC diet is good.  The patient does not know about adjunctive measures for cholesterol lowering.  He expresses no side effects from his lipid-lowering medication.  Comments include: pt is taking meds as ordered.  The patient denies any symptoms to suggest myopathy or liver disease.       Habits & Providers  Alcohol-Tobacco-Diet     Alcohol drinks/day: 0     Tobacco Status: never  Exercise-Depression-Behavior     Does Patient Exercise: yes     Exercise Counseling: not indicated; exercise is adequate  Depression Counseling: not indicated; screening negative for depression     Drug Use: never  Medications Prior to Update: 1)  Carvedilol 12.5 Mg Tabs (Carvedilol) .... Take 1 and 1/2  Tablets  By Mouth Two Times A Day 2)  Klor-Con M20 20 Meq Cr-Tabs (Potassium Chloride Crys Cr) .... One Tablet By Mouth Daily 3)  Aspir-Low 81 Mg Tbec (Aspirin) .... Take One Tablet By Mouth Daily 4)  Furosemide 40 Mg Tabs (Furosemide) .... One Tablet By Mouth Daily 5)  Lisinopril 20 Mg Tabs (Lisinopril) .... One Tablet By Mouth Daily For Blood Pressure 6)  Spironolactone 25 Mg Tabs (Spironolactone) ....  One Tablet By Mouth Daily 7)  Pravastatin Sodium 40 Mg Tabs (Pravastatin Sodium) .... One Tablet By Mouth Nightly For Cholesterol 8)  Relion Ultima Test  Strp (Glucose Blood) 9)  Metformin Hcl 500 Mg Tabs (Metformin Hcl) .... Two Tablets By Mouth Two Times A Day  Allergies (verified): No Known Drug Allergies  Review of Systems General:  Denies fever. CV:  Denies chest pain or discomfort. Resp:  Denies cough. GI:  Denies abdominal pain, nausea, and vomiting; Hx of colon cancer - no colonscopy in many years.  Physical Exam  General:  alert.   Head:  normocephalic.   Lungs:  normal breath sounds.   Heart:  normal rate and regular rhythm.   Abdomen:  normal bowel sounds.   Msk:  up to the exam table Neurologic:  alert & oriented X3.   Skin:  varicosity in bil lower extremity Psych:  Oriented X3.    Diabetes Management Exam:    Foot Exam (with socks and/or shoes not present):       Sensory-Monofilament:          Left foot: normal          Right foot: normal       Nails:          Left foot: normal          Right foot: normal   Impression & Recommendations:  Problem # 1:  DIABETES MELLITUS, UNCONTROLLED (ICD-250.02) Will check Hgba1c today pt to keep on meds His updated medication list for this problem includes:    Aspir-low 81 Mg Tbec (Aspirin) .Marland Kitchen... Take one tablet by mouth daily    Lisinopril 20 Mg Tabs (Lisinopril) ..... One tablet by mouth daily for blood pressure    Metformin Hcl 500 Mg Tabs (Metformin hcl) .Marland Kitchen..Marland Kitchen Two tablets by mouth two times a day  Orders: T- Hemoglobin A1C (82956-21308)  Problem # 2:  HYPERTENSION, BENIGN ESSENTIAL (ICD-401.1) BP slightly elevated advised pt to get back on diet and DASH  His updated medication list for this problem includes:    Carvedilol 12.5 Mg Tabs (Carvedilol) .Marland Kitchen... Take 1 and 1/2  tablets  by mouth two times a day    Furosemide 40 Mg Tabs (Furosemide) ..... One tablet by mouth daily    Lisinopril 20 Mg Tabs (Lisinopril)  ..... One tablet by mouth daily for blood pressure    Spironolactone 25 Mg Tabs (Spironolactone) ..... One tablet by mouth daily  Problem # 3:  HYPERLIPIDEMIA (ICD-272.4) will check on next visit His updated medication list for this problem includes:    Pravastatin Sodium 40 Mg Tabs (Pravastatin sodium) ..... One tablet by mouth nightly for cholesterol  Problem # 4:  Hx of COLON CANCER (ICD-153.9) pt has not had a colonscopy advised that he needs to schedule with GI - cost is a concern  Complete  Medication List: 1)  Carvedilol 12.5 Mg Tabs (Carvedilol) .... Take 1 and 1/2  tablets  by mouth two times a day 2)  Klor-con M20 20 Meq Cr-tabs (Potassium chloride crys cr) .... One tablet by mouth daily 3)  Aspir-low 81 Mg Tbec (Aspirin) .... Take one tablet by mouth daily 4)  Furosemide 40 Mg Tabs (Furosemide) .... One tablet by mouth daily 5)  Lisinopril 20 Mg Tabs (Lisinopril) .... One tablet by mouth daily for blood pressure 6)  Spironolactone 25 Mg Tabs (Spironolactone) .... One tablet by mouth daily 7)  Pravastatin Sodium 40 Mg Tabs (Pravastatin sodium) .... One tablet by mouth nightly for cholesterol 8)  Relion Ultima Test Strp (Glucose blood) 9)  Metformin Hcl 500 Mg Tabs (Metformin hcl) .... Two tablets by mouth two times a day  Other Orders: Capillary Blood Glucose/CBG (75643)  Diabetes Management Assessment/Plan:      The following lipid goals have been established for the patient: Total cholesterol goal of 200; LDL cholesterol goal of 100; HDL cholesterol goal of 40; Triglyceride goal of 150.  His blood pressure goal is < 130/80.    Hypertension Assessment/Plan:      The patient's hypertensive risk group is category C: Target organ damage and/or diabetes.  His calculated 10 year risk of coronary heart disease is 18 %.  Today's blood pressure is 145/96.  His blood pressure goal is < 130/80.  Lipid Assessment/Plan:      Based on NCEP/ATP III, the patient's risk factor category is  "history of diabetes".  The patient's lipid goals are as follows: Total cholesterol goal is 200; LDL cholesterol goal is 100; HDL cholesterol goal is 40; Triglyceride goal is 150.  His LDL cholesterol goal has not been met.    Patient Instructions: 1)  Weight - up 6 pounds since the last visit, this may be due to weight lifting.  Get back on your diet and do what you need to do to keep your blood pressure and blood sugar under good control 2)  Blood pressure - 145/96.  slightly elevated 3)  Diabetes - Your Hgba1c will be checked today.  it was 6.0 on last visit which was very good.  You will be notified of the results 4)  All your medications have been sent to the pharmacy (refilled) 5)  Follow up in 6  months for diabetes 6)  you will need u/a, cbg, hgba1c, tdap, lipids, cmp, cbc, tsh 7)  No food after midnight before this visit.  Also discuss GI referral for colonscopy Prescriptions: METFORMIN HCL 500 MG TABS (METFORMIN HCL) Two tablets by mouth two times a day  #120 Each x 11   Entered and Authorized by:   Lehman Prom FNP   Signed by:   Lehman Prom FNP on 03/12/2011   Method used:   Electronically to        [facility] [phone]* (retail)       [address]       [place], [place]  [address]       Ph: [phone]       Fax: [phone]   RxID:   [id number] PRAVASTATIN SODIUM 40 MG TABS (PRAVASTATIN SODIUM) One tablet by mouth nightly for cholesterol  #30 Each x 11   Entered and Authorized by:   Lehman Prom FNP   Signed by:   Lehman Prom FNP on 03/12/2011   Method used:   Electronically to        [facility]  [address] [phone]* (retail)       [address]       [place], [place]  [address]       Ph: [phone]       Fax: [phone]   RxID:   [id number] SPIRONOLACTONE 25 MG TABS (SPIRONOLACTONE) One tablet by mouth daily  #30 x 11   Entered and Authorized by:   Lehman Prom FNP   Signed by:   Lehman Prom FNP on 03/12/2011   Method used:    Electronically to        [facility] [phone]* (retail)       [address]       [place], [place]  [address]       Ph: [phone]       Fax: [phone]   RxID:   [id number] LISINOPRIL 20 MG TABS (LISINOPRIL) One tablet by mouth daily for blood pressure  #30 Each x 11   Entered and Authorized by:   Lehman Prom FNP   Signed by:   Lehman Prom FNP on 03/12/2011   Method used:   Electronically to        [facility] [phone]* (retail)       [address]       [place], [place]  [address]       Ph: [phone]       Fax: [phone]   RxID:   [id number] FUROSEMIDE 40 MG TABS (FUROSEMIDE) One tablet by mouth daily  #30 x 11   Entered and Authorized by:   Lehman Prom FNP   Signed by:   Lehman Prom FNP on 03/12/2011   Method used:   Electronically to        [facility] [phone]* (retail)       [address]       [place], [place]  [address]       Ph: [phone]       Fax: [phone]   RxID:   [id number] KLOR-CON M20 20 MEQ CR-TABS (POTASSIUM CHLORIDE CRYS CR) One tablet by mouth daily  #30 Each x 11   Entered and Authorized by:   Lehman Prom FNP   Signed by:   Lehman Prom FNP on 03/12/2011   Method used:   Electronically to        [facility] [phone]* (retail)       [address]       [place], [place]  [address]       Ph: [phone]       Fax: [phone]   RxID:   [id number]    Orders Added: 1)  Capillary Blood Glucose/CBG [82948] 2)  Est. Patient Level III [82505] 3)  T- Hemoglobin A1C [83036-23375]    Laboratory Results   Blood Tests     CBG Random:: 173        Last LDL:                                                 131 (05/13/2010 9:30:02 AM)        Diabetic Foot Exam    10-g (5.07) Semmes-Weinstein Monofilament Test Performed by: Levon Hedger          Right Foot          Left Foot Visual Inspection  Test Control      normal         normal Site 1          normal         normal Site 2         normal         normal Site 3         normal         normal Site 4         normal         normal Site 5         normal         normal Site 6         normal         normal Site 7         normal         normal Site 8         normal         normal Site 9         normal         normal Site 10         normal         normal  Impression      normal         normal   Prevention & Chronic Care Immunizations   Influenza vaccine: Not documented   Influenza vaccine deferral: Not available  (03/12/2011)    Tetanus booster: Not documented   Td booster deferral: Not available  (03/12/2011)    Pneumococcal vaccine: Not documented  Other Screening   Smoking status: never  (03/12/2011)  Diabetes Mellitus   HgbA1C: 6.0  (08/24/2010)    Eye exam: Not documented    Foot exam: yes  (03/12/2011)   High risk foot: Not documented   Foot care education: Done  (08/24/2010)    Urine microalbumin/creatinine ratio: Not documented  Lipids   Total Cholesterol: 192  (05/13/2010)   LDL: 131  (05/13/2010)   LDL Direct: Not documented   HDL: 32  (05/13/2010)   Triglycerides: 146  (05/13/2010)    SGOT (AST): Not documented   SGPT (ALT): Not documented   Alkaline phosphatase: Not documented   Total bilirubin: Not documented  Hypertension   Last Blood Pressure: 145 / 96  (03/12/2011)   Serum creatinine: 1.0  (06/01/2010)   Serum potassium 4.5  (06/01/2010)  Self-Management Support :    Diabetes self-management support: Not documented    Hypertension self-management support: Not documented    Lipid self-management support: Not documented

## 2011-03-19 NOTE — Progress Notes (Signed)
Summary: Office Visit//health screen  Office Visit//health screen   Imported By: Arta Bruce 03/12/2011 15:02:51  _____________________________________________________________________  External Attachment:    Type:   Image     Comment:   External Document

## 2011-08-22 IMAGING — CR DG CHEST 1V PORT
1 series · 1 of 1 positions shown · non-contrast
Comparison: None.

CLINICAL DATA: Chest pain and shortness breath.

PORTABLE CHEST - 1 VIEW

[view not recorded]
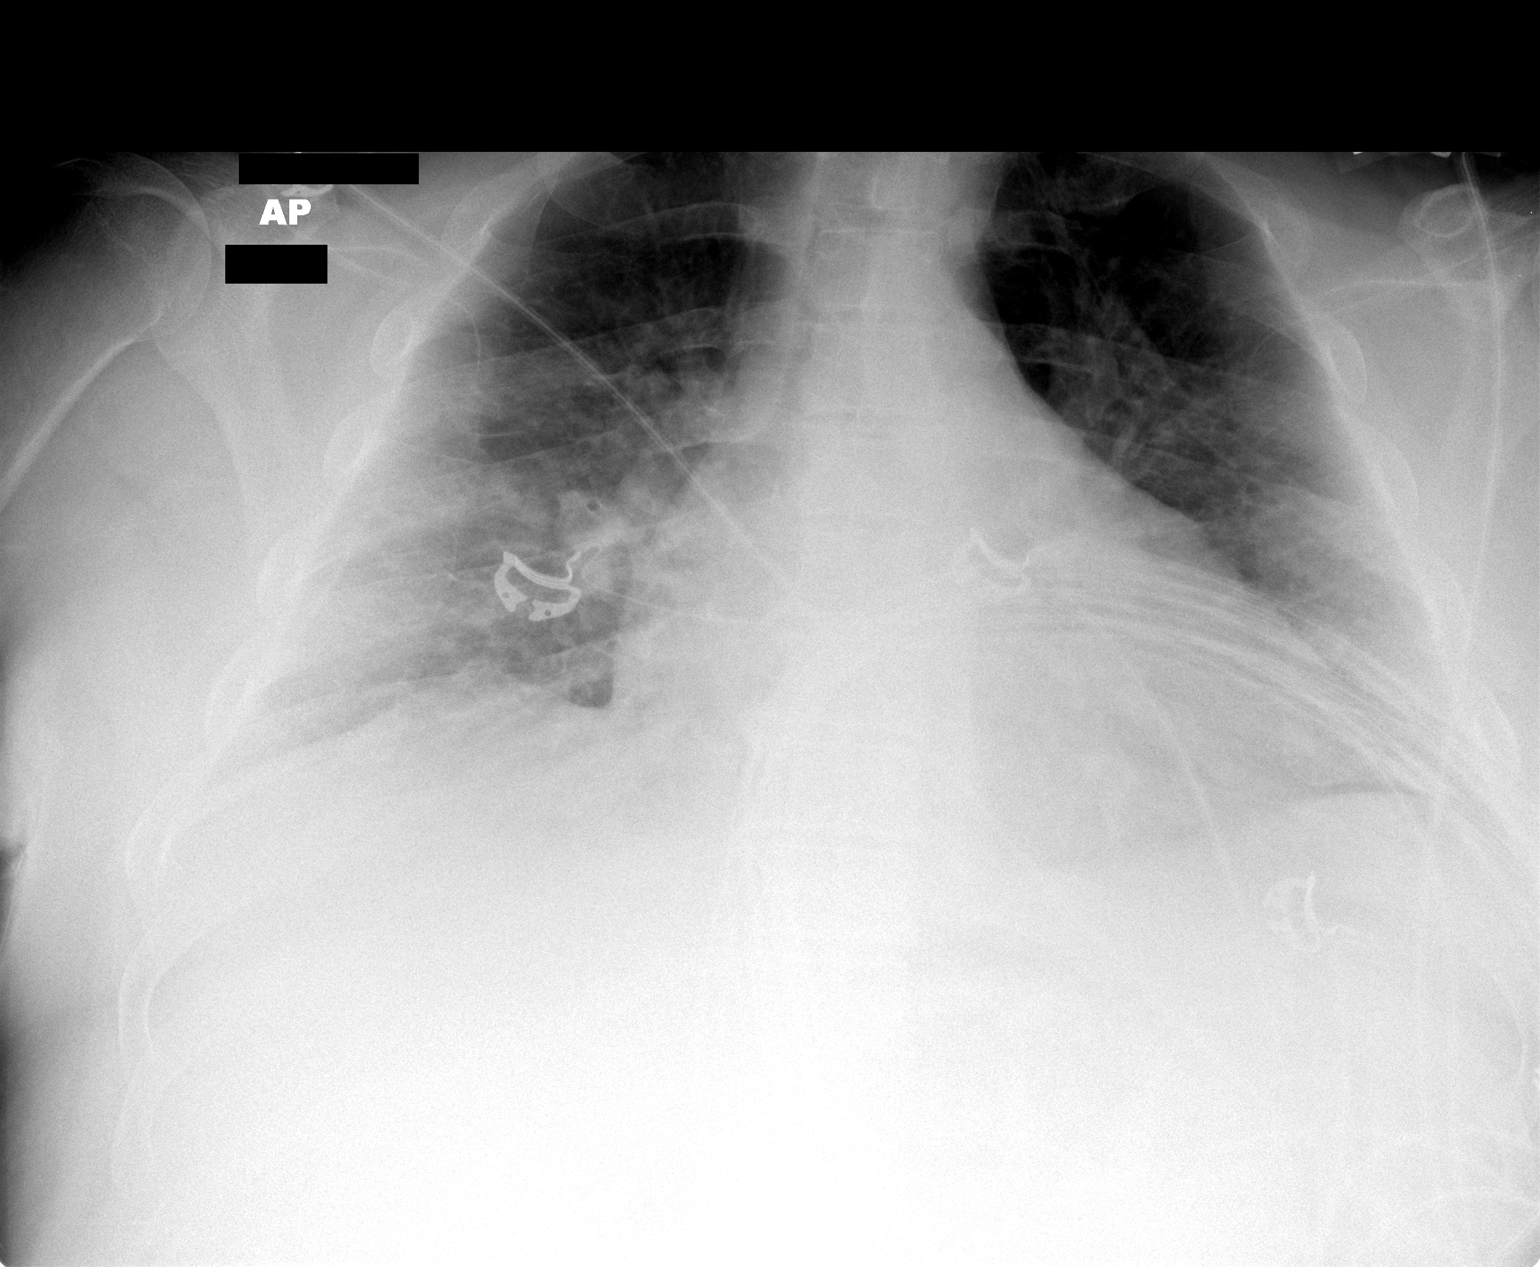

[1 of 1 positions shown; findings below may reference images not displayed]

FINDINGS: Portable view of the chest demonstrates enlarged
interstitial densities concerning for mild edema.  The patient does
have low lung volumes.  Cardiac silhouette is upper limits of
normal.  Trachea is midline.
IMPRESSION: Prominent interstitial densities concerning for mild edema.

## 2012-04-13 ENCOUNTER — Encounter (HOSPITAL_COMMUNITY): Payer: Self-pay

## 2012-04-13 ENCOUNTER — Emergency Department (HOSPITAL_COMMUNITY)
Admission: EM | Admit: 2012-04-13 | Discharge: 2012-04-13 | Disposition: A | Discharge status: Home / Self Care | Payer: Self-pay | Attending: Emergency Medicine | Admitting: Emergency Medicine

## 2012-04-13 ENCOUNTER — Emergency Department (HOSPITAL_COMMUNITY): Payer: Self-pay

## 2012-04-13 DIAGNOSIS — R5381 Other malaise: Secondary | ICD-10-CM | POA: Insufficient documentation

## 2012-04-13 DIAGNOSIS — J189 Pneumonia, unspecified organism: Secondary | ICD-10-CM | POA: Insufficient documentation

## 2012-04-13 DIAGNOSIS — Z85038 Personal history of other malignant neoplasm of large intestine: Secondary | ICD-10-CM | POA: Insufficient documentation

## 2012-04-13 DIAGNOSIS — R05 Cough: Secondary | ICD-10-CM | POA: Insufficient documentation

## 2012-04-13 DIAGNOSIS — R059 Cough, unspecified: Secondary | ICD-10-CM | POA: Insufficient documentation

## 2012-04-13 DIAGNOSIS — E119 Type 2 diabetes mellitus without complications: Secondary | ICD-10-CM | POA: Insufficient documentation

## 2012-04-13 DIAGNOSIS — Z79899 Other long term (current) drug therapy: Secondary | ICD-10-CM | POA: Insufficient documentation

## 2012-04-13 DIAGNOSIS — I509 Heart failure, unspecified: Secondary | ICD-10-CM | POA: Insufficient documentation

## 2012-04-13 DIAGNOSIS — R42 Dizziness and giddiness: Secondary | ICD-10-CM | POA: Insufficient documentation

## 2012-04-13 HISTORY — DX: Malignant neoplasm of colon, unspecified: C18.9

## 2012-04-13 HISTORY — DX: Heart failure, unspecified: I50.9

## 2012-04-13 LAB — COMPREHENSIVE METABOLIC PANEL
ALT: 6 U/L (ref 0–53)
AST: 15 U/L (ref 0–37)
Albumin: 3.6 g/dL (ref 3.5–5.2)
Alkaline Phosphatase: 88 U/L (ref 39–117)
BUN: 10 mg/dL (ref 6–23)
Chloride: 99 mEq/L (ref 96–112)
Potassium: 3.8 mEq/L (ref 3.5–5.1)
Sodium: 136 mEq/L (ref 135–145)
Total Bilirubin: 0.7 mg/dL (ref 0.3–1.2)

## 2012-04-13 LAB — DIFFERENTIAL
Basophils Absolute: 0 10*3/uL (ref 0.0–0.1)
Basophils Relative: 0 % (ref 0–1)
Monocytes Relative: 9 % (ref 3–12)
Neutro Abs: 5.5 10*3/uL (ref 1.7–7.7)
Neutrophils Relative %: 72 % (ref 43–77)

## 2012-04-13 LAB — CBC
Hemoglobin: 13.2 g/dL (ref 13.0–17.0)
MCH: 29.3 pg (ref 26.0–34.0)
MCHC: 35.4 g/dL (ref 30.0–36.0)

## 2012-04-13 LAB — URINALYSIS, ROUTINE W REFLEX MICROSCOPIC
Glucose, UA: >1000 mg/dL — AB
Ketones, ur: NEGATIVE mg/dL
Protein, ur: 100 mg/dL — AB

## 2012-04-13 LAB — URINE MICROSCOPIC-ADD ON

## 2012-04-13 MED ORDER — SODIUM CHLORIDE 0.9 % IV BOLUS (SEPSIS)
1000.0000 mL | Freq: Once | INTRAVENOUS | Status: AC
  Administered 2012-04-13: 1000 mL via INTRAVENOUS

## 2012-04-13 MED ORDER — AZITHROMYCIN 250 MG PO TABS
500.0000 mg | ORAL_TABLET | Freq: Once | ORAL | Status: AC
  Administered 2012-04-13: 500 mg via ORAL
  Filled 2012-04-13: qty 2

## 2012-04-13 MED ORDER — HYDROCOD POLST-CHLORPHEN POLST 10-8 MG/5ML PO LQCR: 5.0000 mL | Freq: Two times a day (BID) | ORAL | Status: DC | PRN

## 2012-04-13 MED ORDER — ACETAMINOPHEN 325 MG PO TABS
650.0000 mg | ORAL_TABLET | Freq: Once | ORAL | Status: AC
  Administered 2012-04-13: 650 mg via ORAL
  Filled 2012-04-13: qty 2

## 2012-04-13 MED ORDER — METFORMIN HCL 500 MG PO TABS: 500.0000 mg | ORAL_TABLET | ORAL | Status: DC

## 2012-04-13 MED ORDER — AZITHROMYCIN 250 MG PO TABS: ORAL_TABLET | ORAL | Status: DC

## 2012-04-13 MED ORDER — HYDROCODONE-HOMATROPINE 5-1.5 MG/5ML PO SYRP
5.0000 mL | ORAL_SOLUTION | Freq: Once | ORAL | Status: AC
  Administered 2012-04-13: 5 mL via ORAL
  Filled 2012-04-13: qty 5

## 2012-04-13 NOTE — ED Notes (Signed)
cbg 211 

## 2012-04-13 NOTE — ED Notes (Signed)
Patient presents with dizziness and fatigue since yesterday afternoon. Patient reports he feels "drunk"  Patient also reporting cough. CBG was checked in ED today and was 339.  Patient reports he has not had his metformin in 1 month.

## 2012-04-13 NOTE — ED Provider Notes (Signed)
History     CSN: [id number]  Arrival date & time 04/13/12  1191   First MD Initiated Contact with Patient 04/13/12 1055      Chief Complaint  Patient presents with  . Dizziness  . Fatigue    (Consider location/radiation/quality/duration/timing/severity/associated sxs/prior treatment) Patient is a 46 y.o. male presenting with cough. The history is provided by the patient (Patient complains of weakness and a cough no shortness of breath no fever). No language interpreter was used.  Cough This is a new problem. The current episode started 2 days ago. The problem occurs hourly. The problem has not changed since onset.The cough is non-productive. There has been no fever. Pertinent negatives include no chest pain, no chills, no sweats, no headaches and no eye redness. He has tried nothing for the symptoms. The treatment provided no relief. He is not a smoker. His past medical history does not include COPD.    Past Medical History  Diagnosis Date  . Diabetes mellitus   . CHF (congestive heart failure)   . Colon cancer     Past Surgical History  Procedure Date  . Cholecystectomy   . Appendectomy   . Colon surgery     No family history on file.  History  Substance Use Topics  . Smoking status: Never Smoker   . Smokeless tobacco: Not on file  . Alcohol Use: No      Review of Systems  Constitutional: Negative for chills and fatigue.  HENT: Negative for congestion, sinus pressure and ear discharge.   Eyes: Negative for discharge and redness.  Respiratory: Positive for cough.   Cardiovascular: Negative for chest pain.  Gastrointestinal: Negative for abdominal pain and diarrhea.  Genitourinary: Negative for frequency and hematuria.  Musculoskeletal: Negative for back pain.  Skin: Negative for rash.  Neurological: Negative for seizures and headaches.  Hematological: Negative.   Psychiatric/Behavioral: Negative for hallucinations.    Allergies  Review of patient's  allergies indicates no known allergies.  Home Medications   Current Outpatient Rx  Name Route Sig Dispense Refill  . METFORMIN HCL 500 MG PO TABS Oral Take 500 mg by mouth 2 (two) times daily with a meal.    . PRAVASTATIN SODIUM 40 MG PO TABS Oral Take 40 mg by mouth daily.    . AZITHROMYCIN 250 MG PO TABS  Take one pill a day.  Start tomorrow 4 tablet 0  . HYDROCOD POLST-CPM POLST ER 10-8 MG/5ML PO LQCR Oral Take 5 mLs by mouth every 12 (twelve) hours as needed. 140 mL 0  . METFORMIN HCL 500 MG PO TABS Oral Take 1 tablet (500 mg total) by mouth 1 day or 1 dose. 30 tablet 0    BP 133/78  Pulse 106  Temp(Src) 99.6 F (37.6 C) (Oral)  Resp 24  SpO2 91%  Physical Exam  Constitutional: He is oriented to person, place, and time. He appears well-developed.  HENT:  Head: Normocephalic and atraumatic.  Eyes: Conjunctivae and EOM are normal. No scleral icterus.  Neck: Neck supple. No thyromegaly present.  Cardiovascular: Normal rate and regular rhythm.  Exam reveals no gallop and no friction rub.   No murmur heard. Pulmonary/Chest: No stridor. He has no wheezes. He has no rales. He exhibits no tenderness.  Abdominal: He exhibits no distension. There is no tenderness. There is no rebound.  Musculoskeletal: Normal range of motion. He exhibits no edema.  Lymphadenopathy:    He has no cervical adenopathy.  Neurological: He is oriented to person,  place, and time. Coordination normal.  Skin: No rash noted. No erythema.  Psychiatric: He has a normal mood and affect. His behavior is normal.    ED Course  Procedures (including critical care time)  Labs Reviewed  GLUCOSE, CAPILLARY - Abnormal; Notable for the following:    Glucose-Capillary 339 (*)    All other components within normal limits  CBC - Abnormal; Notable for the following:    HCT 37.3 (*)    All other components within normal limits  COMPREHENSIVE METABOLIC PANEL - Abnormal; Notable for the following:    Glucose, Bld 317  (*)    All other components within normal limits  URINALYSIS, ROUTINE W REFLEX MICROSCOPIC - Abnormal; Notable for the following:    Specific Gravity, Urine 1.035 (*)    Glucose, UA >1000 (*)    Hgb urine dipstick MODERATE (*)    Protein, ur 100 (*)    All other components within normal limits  DIFFERENTIAL  URINE MICROSCOPIC-ADD ON   Dg Chest 2 View  04/13/2012  *RADIOLOGY REPORT*  Clinical Data: Cough.  Fever.  Dizziness.  CHEST - 2 VIEW  Comparison: 05/13/2010  Findings: Heart size is normal.  Mediastinal shadows are normal. There is central bronchial thickening.  There is pulmonary infiltrate in the left upper lobe consistent with pneumonia.  No effusions.  No acute bony findings.  IMPRESSION: Bronchitis.  Left upper lobe pneumonia.  Original Report Authenticated By: Thomasenia Sales, M.D.     1. Community acquired pneumonia       MDM  Pt with pneumonia.  Pt not hypoxic or tachycardic.  Pt wants to be treated as out pt        Benny Lennert, MD 04/13/12 1330

## 2012-04-13 NOTE — Discharge Instructions (Signed)
Follow up in 2-3 days for recheck °

## 2012-04-13 NOTE — ED Notes (Signed)
Notified RN of CBG 339 

## 2012-04-14 LAB — GLUCOSE, CAPILLARY: Glucose-Capillary: 211 mg/dL — ABNORMAL HIGH (ref 70–99)

## 2012-09-19 ENCOUNTER — Observation Stay (HOSPITAL_COMMUNITY)
Admission: EM | Admit: 2012-09-19 | Discharge: 2012-09-21 | Disposition: A | Discharge status: Home / Self Care | Payer: 59 | Attending: Internal Medicine | Admitting: Internal Medicine

## 2012-09-19 ENCOUNTER — Emergency Department (HOSPITAL_COMMUNITY): Payer: Self-pay

## 2012-09-19 ENCOUNTER — Encounter (HOSPITAL_COMMUNITY): Payer: Self-pay | Admitting: Emergency Medicine

## 2012-09-19 DIAGNOSIS — I422 Other hypertrophic cardiomyopathy: Secondary | ICD-10-CM | POA: Insufficient documentation

## 2012-09-19 DIAGNOSIS — R0602 Shortness of breath: Secondary | ICD-10-CM | POA: Insufficient documentation

## 2012-09-19 DIAGNOSIS — E785 Hyperlipidemia, unspecified: Secondary | ICD-10-CM

## 2012-09-19 DIAGNOSIS — I10 Essential (primary) hypertension: Secondary | ICD-10-CM

## 2012-09-19 DIAGNOSIS — C189 Malignant neoplasm of colon, unspecified: Secondary | ICD-10-CM

## 2012-09-19 DIAGNOSIS — I5023 Acute on chronic systolic (congestive) heart failure: Principal | ICD-10-CM | POA: Insufficient documentation

## 2012-09-19 DIAGNOSIS — I509 Heart failure, unspecified: Secondary | ICD-10-CM | POA: Insufficient documentation

## 2012-09-19 DIAGNOSIS — R0989 Other specified symptoms and signs involving the circulatory and respiratory systems: Secondary | ICD-10-CM

## 2012-09-19 DIAGNOSIS — IMO0001 Reserved for inherently not codable concepts without codable children: Secondary | ICD-10-CM | POA: Insufficient documentation

## 2012-09-19 DIAGNOSIS — E669 Obesity, unspecified: Secondary | ICD-10-CM

## 2012-09-19 DIAGNOSIS — R0902 Hypoxemia: Secondary | ICD-10-CM | POA: Insufficient documentation

## 2012-09-19 DIAGNOSIS — I5022 Chronic systolic (congestive) heart failure: Secondary | ICD-10-CM

## 2012-09-19 DIAGNOSIS — I8390 Asymptomatic varicose veins of unspecified lower extremity: Secondary | ICD-10-CM

## 2012-09-19 DIAGNOSIS — R0609 Other forms of dyspnea: Secondary | ICD-10-CM

## 2012-09-19 DIAGNOSIS — I428 Other cardiomyopathies: Secondary | ICD-10-CM

## 2012-09-19 HISTORY — DX: Pure hypercholesterolemia, unspecified: E78.00

## 2012-09-19 HISTORY — DX: Essential (primary) hypertension: I10

## 2012-09-19 LAB — CBC
HCT: 38.6 % — ABNORMAL LOW (ref 39.0–52.0)
Hemoglobin: 12.8 g/dL — ABNORMAL LOW (ref 13.0–17.0)
RDW: 13.7 % (ref 11.5–15.5)
WBC: 8.2 10*3/uL (ref 4.0–10.5)

## 2012-09-19 LAB — BASIC METABOLIC PANEL
BUN: 11 mg/dL (ref 6–23)
Chloride: 101 mEq/L (ref 96–112)
GFR calc Af Amer: >90 mL/min (ref >90)
Glucose, Bld: 323 mg/dL — ABNORMAL HIGH (ref 70–99)
Potassium: 4 mEq/L (ref 3.5–5.1)
Sodium: 136 mEq/L (ref 135–145)

## 2012-09-19 LAB — POCT I-STAT TROPONIN I: Troponin i, poc: 0.03 ng/mL (ref 0.00–0.08)

## 2012-09-19 MED ORDER — SODIUM CHLORIDE 0.9 % IV SOLN
INTRAVENOUS | Status: DC
  Administered 2012-09-19: 18:00:00 via INTRAVENOUS

## 2012-09-19 MED ORDER — FUROSEMIDE 10 MG/ML IJ SOLN
40.0000 mg | Freq: Every day | INTRAMUSCULAR | Status: DC
  Administered 2012-09-20 – 2012-09-21 (×2): 40 mg via INTRAVENOUS
  Filled 2012-09-19 (×2): qty 4

## 2012-09-19 MED ORDER — ONDANSETRON HCL 4 MG/2ML IJ SOLN: 4.0000 mg | Freq: Four times a day (QID) | INTRAMUSCULAR | Status: DC | PRN

## 2012-09-19 MED ORDER — ACETAMINOPHEN 325 MG PO TABS: 650.0000 mg | ORAL_TABLET | ORAL | Status: DC | PRN

## 2012-09-19 MED ORDER — FUROSEMIDE 10 MG/ML IJ SOLN
40.0000 mg | Freq: Once | INTRAMUSCULAR | Status: AC
  Administered 2012-09-19: 40 mg via INTRAVENOUS
  Filled 2012-09-19: qty 4

## 2012-09-19 MED ORDER — NITROGLYCERIN 0.4 MG SL SUBL: 0.4000 mg | SUBLINGUAL_TABLET | SUBLINGUAL | Status: DC | PRN

## 2012-09-19 MED ORDER — SODIUM CHLORIDE 0.9 % IJ SOLN: 3.0000 mL | INTRAMUSCULAR | Status: DC | PRN

## 2012-09-19 MED ORDER — SODIUM CHLORIDE 0.9 % IJ SOLN
3.0000 mL | Freq: Two times a day (BID) | INTRAMUSCULAR | Status: DC
  Administered 2012-09-20 – 2012-09-21 (×3): 3 mL via INTRAVENOUS

## 2012-09-19 MED ORDER — ASPIRIN EC 81 MG PO TBEC
81.0000 mg | DELAYED_RELEASE_TABLET | Freq: Every day | ORAL | Status: DC
  Administered 2012-09-20 – 2012-09-21 (×2): 81 mg via ORAL
  Filled 2012-09-19 (×2): qty 1

## 2012-09-19 MED ORDER — SODIUM CHLORIDE 0.9 % IV SOLN: 250.0000 mL | INTRAVENOUS | Status: DC | PRN

## 2012-09-19 MED ORDER — HEPARIN SODIUM (PORCINE) 5000 UNIT/ML IJ SOLN
5000.0000 [IU] | Freq: Three times a day (TID) | INTRAMUSCULAR | Status: DC
  Administered 2012-09-20 – 2012-09-21 (×5): 5000 [IU] via SUBCUTANEOUS
  Filled 2012-09-19 (×8): qty 1

## 2012-09-19 MED ORDER — INSULIN ASPART 100 UNIT/ML ~~LOC~~ SOLN
0.0000 [IU] | Freq: Three times a day (TID) | SUBCUTANEOUS | Status: DC
  Administered 2012-09-20: 8 [IU] via SUBCUTANEOUS
  Administered 2012-09-20: 3 [IU] via SUBCUTANEOUS
  Administered 2012-09-20: 11 [IU] via SUBCUTANEOUS
  Administered 2012-09-21 (×2): 3 [IU] via SUBCUTANEOUS

## 2012-09-19 NOTE — ED Notes (Signed)
Pt c/o increased SOB x several days with swelling in legs and tightness in abd area; pt sts hx of CHF; pt denies CP; pt sts non productive cough

## 2012-09-19 NOTE — H&P (Signed)
[facility] History and Physical  Jared Hart ZOX:[id number] DOB: Mar 16, 1966 DOA: 09/19/2012  Referring physician: ED PCP: Sheila Oats, MD   Chief Complaint: SOB, dry cough  HPI: Jared Hart is a 46 y.o. 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  Presented to ED with c/o SOB and dry cough onset 1-2 weeks ago, [facility] and he has not been able to get refills on his chronic HTN medications (sounds like he was on metformin, pravastatin, and lasix although this is not evident in computer system).  Regardless the patient began to develop symptoms of a dry cough, thought he was getting a chest cold and so began taking phenylephrine containing OTC medications (Dayquil).  Unfortunately his symptoms were not alleviated by this, and they continued to worsen (actually probably were exacerbated by this though he isn't sure of that) and so he sought treatment in the ED.  In the ED he was noted to have symptoms of CHF including BLE Edema, pulmonary crackles, and an BNP significantly elevated from his baseline taken in 2011.  Hospitalist service has been asked to admit the patient.  Review of Systems: Admits to chest pressure (but not "pain"), SOB, DOE, BLE edema, dry cough, orthopnea, 12 systems reviewed and otherwise negative.  Past Medical History  Diagnosis Date  . Diabetes mellitus   . CHF (congestive heart failure)   . Colon cancer   . Hypercholesteremia   . HTN (hypertension)    Past Surgical History  Procedure Date  . Cholecystectomy   . Appendectomy   . Colon surgery    Social History:  reports that he has never smoked. He does not have any smokeless tobacco history on file. He reports that he does not drink alcohol or use illicit drugs. Patient lives at home performs most ADLs  No Known Allergies  Family History  Problem Relation Age of  Onset  . Diabetes    . Cancer    . Hypertension    Denies early onset COPD, COPD in non smokers, early onset MI.  Prior to Admission medications   Medication Sig Start Date End Date Taking? Authorizing Provider  Pseudoeph-Doxylamine-DM-APAP (NYQUIL PO) Take 15 mLs by mouth at bedtime as needed. For congestion   Yes Historical Provider, MD  Pseudoephedrine-APAP-DM (DAYQUIL PO) Take 15 mLs by mouth 2 (two) times daily as needed. For congestion   Yes Historical Provider, MD   Physical Exam: Filed Vitals:   09/19/12 1620 09/19/12 1817 09/19/12 2024 09/19/12 2204  BP:  157/93 167/101 169/101  Pulse:  104 100 105  Temp:      TempSrc:      Resp:  16 25 24   Height: 6' (1.829 m)     SpO2:  98% 98% 97%     General:  NAD, resting comfortably proped up at 45 degree angle in bed (states he sleeps at home with 2 pillows and about a 45 degree angle.  Eyes: PEERLA EOMI  ENT: moist mucous membranes  Neck: supple w/o JVD  Cardiovascular: RRR w/o MRG  Respiratory: mild crackles at the bases  Abdomen: soft, nt, nd, bs+, obese  Skin: patient has 1+ pitting edema BLE, his BLE are also remarkable for obvious varicose veins.  Musculoskeletal: 5/5 muscle strength BUE BLE, patient able to sit up to side of bed without difficulty  Neurologic: MAE, grossly non-focal exam  Labs on Admission:  Basic Metabolic Panel:  Lab 09/19/12 8119  NA 136  K 4.0  CL 101  CO2 25  GLUCOSE 323*  BUN 11  CREATININE 1.00  CALCIUM 8.9  MG --  PHOS --   Liver Function Tests: No results found for this basename: AST:5,ALT:5,ALKPHOS:5,BILITOT:5,PROT:5,ALBUMIN:5 in the last 168 hours No results found for this basename: LIPASE:5,AMYLASE:5 in the last 168 hours No results found for this basename: AMMONIA:5 in the last 168 hours CBC:  Lab 09/19/12 1648  WBC 8.2  NEUTROABS --  HGB 12.8*  HCT 38.6*  MCV 82.5  PLT 230   Cardiac Enzymes: No results found for this basename:  CKTOTAL:5,CKMB:5,CKMBINDEX:5,TROPONINI:5 in the last 168 hours  BNP (last 3 results)  Basename 09/19/12 1648  PROBNP 3236.0*   CBG: No results found for this basename: GLUCAP:5 in the last 168 hours  Radiological Exams on Admission: Dg Chest 2 View  09/19/2012  *RADIOLOGY REPORT*  Clinical Data: Shortness of breath, cough  CHEST - 2 VIEW  Comparison: 04/13/2012  Findings: Cardiomediastinal silhouette is stable.  No acute infiltrate or pulmonary edema.  Central mild bronchitic changes. Bony thorax is stable.  IMPRESSION: No acute infiltrate or pulmonary edema.  Central mild bronchitic changes.   Original Report Authenticated By: Natasha Mead, M.D.     EKG: Independently reviewed.  Assessment/Plan Principal Problem:  *Acute exacerbation of CHF (congestive heart failure) Active Problems:  HYPERTENSION, BENIGN ESSENTIAL  CARDIOMYOPATHY  SYSTOLIC HEART FAILURE, CHRONIC   1. Acute exacerbation of chronic CHF with impaired systolic function - Looks like his EF is 25% historically, will order a 2D echo but if his EF continues to be 25% at this point in time, cardiology consult is warranted for evaluation for AICD, will order prophylactic dose heparin for now but if his EF is low on the 2D echo then may be a candidate for chronic coumadin therapy, dosent appear to be ischemic in nature but probably hypertensive cardiomyopathy, never the less will order serial troponin's on the patient.  Will also get social work and case management consult on board earlier rather than later for patient lack of insurance especially if he ends up meeting the indications for AICD.  Ordering Lasix 40mg  iv daily for now with daily BMPs to make sure he dosent become hypokalemic. 2. HTN - will order lisinopril 10mg  daily to be started on this patient who also has DM2 and CHF.  BP in ED is 170s so will order PRN labetalol to be given overnight as well.  Note that his med list in 2011 included carvedilol 12.5mg  po BID, lasix  40 po daily, spironolactone 25mg  po daily, lisinopril 20mg  po daily.  It is likely he will need to be on these medications in the future long term as well, but he really needs to get a PCP to initiate and prescribe these long term. 3. DM2 - will order SSI while inpatient.  Likely needs to be on Lantus as well, Is on 2gm metformin per day but BGL today is 323 despite this so probably not enough for home dosing.  Code Status: Full Code Family Communication: Family present at bedside Disposition Plan: Admit to obs.  Time spent: 90 min  GARDNER, JARED M. [facility] Pager [phone]  If 7PM-7AM, please contact night-coverage [email or web address] Password [email or web address] 09/19/2012, 11:10 PM

## 2012-09-19 NOTE — ED Provider Notes (Addendum)
History     CSN: [id number]  Arrival date & time 09/19/12  1607   First MD Initiated Contact with Patient 09/19/12 1721      Chief Complaint  Patient presents with  . Shortness of Breath    (Consider location/radiation/quality/duration/timing/severity/associated sxs/prior treatment) Patient is a 46 y.o. male presenting with shortness of breath. The history is provided by the patient.  Shortness of Breath  Associated symptoms include cough and shortness of breath. Pertinent negatives include no chest pain and no fever.   46 year old, male, with a history of congestive heart failure, presents emergency department complaining of progressive shortness of breath, dyspnea on exertion, and nonproductive cough, and abdominal distention.  For the past, one to 2 weeks.  He states that he has not been taking his medications for his congestive heart failure.  Because [facility] .  Since the hospital decided not to fund it anymore.  He also has orthopnea.  He denies pain.  He denies fevers, or chills.  He has had ankle swelling.  He denies smoking, and he does not have a history of COPD, or asthma  Past Medical History  Diagnosis Date  . Diabetes mellitus   . CHF (congestive heart failure)   . Colon cancer   . Hypercholesteremia     Past Surgical History  Procedure Date  . Cholecystectomy   . Appendectomy   . Colon surgery     History reviewed. No pertinent family history.  History  Substance Use Topics  . Smoking status: Never Smoker   . Smokeless tobacco: Not on file  . Alcohol Use: No      Review of Systems  Constitutional: Negative for fever, chills and diaphoresis.  HENT: Negative for neck pain.   Respiratory: Positive for cough and shortness of breath. Negative for chest tightness.   Cardiovascular: Positive for leg swelling. Negative for chest pain and palpitations.  Gastrointestinal: Negative for nausea and vomiting.       Abdominal swelling    Neurological: Negative for weakness.  All other systems reviewed and are negative.    Allergies  Review of patient's allergies indicates no known allergies.  Home Medications   Current Outpatient Rx  Name Route Sig Dispense Refill  . NYQUIL PO Oral Take 15 mLs by mouth at bedtime as needed. For congestion    . DAYQUIL PO Oral Take 15 mLs by mouth 2 (two) times daily as needed. For congestion      BP 157/93  Pulse 104  Temp 97.7 F (36.5 C) (Oral)  Resp 16  Ht 6' (1.829 m)  SpO2 98%  Physical Exam  Nursing note and vitals reviewed. Constitutional: He is oriented to person, place, and time. No distress.       Morbid obesity. Speaking in full sentences, with hacking cough  Eyes: Conjunctivae normal are normal.  Neck: Normal range of motion. Neck supple.  Cardiovascular: Normal rate, regular rhythm and intact distal pulses.   No murmur heard. Pulmonary/Chest: Effort normal. No respiratory distress. He has wheezes. He has no rales.       Scattered wheezes  Abdominal: Soft. He exhibits distension. There is no tenderness. There is no rebound.  Musculoskeletal: He exhibits edema. He exhibits no tenderness.       2+ pedal edema  Neurological: He is alert and oriented to person, place, and time.  Skin: Skin is warm and dry.  Psychiatric: He has a normal mood and affect. Thought content normal.  ED Course  Procedures (including critical care time) [age]-year-old, male, with congestive heart failure, presents emergency department with dyspnea on exertion, orthopnea, and nonproductive cough since he has run out of his medications.  After, the clinic.  He used to be taking care of him closed  Labs Reviewed  CBC - Abnormal; Notable for the following:    Hemoglobin 12.8 (*)     HCT 38.6 (*)     All other components within normal limits  BASIC METABOLIC PANEL - Abnormal; Notable for the following:    Glucose, Bld 323 (*)     GFR calc non Af Amer 90 (*)     All other components  within normal limits  PRO B NATRIURETIC PEPTIDE - Abnormal; Notable for the following:    Pro B Natriuretic peptide (BNP) 3236.0 (*)     All other components within normal limits  POCT I-STAT TROPONIN I   Dg Chest 2 View  09/19/2012  *RADIOLOGY REPORT*  Clinical Data: Shortness of breath, cough  CHEST - 2 VIEW  Comparison: 04/13/2012  Findings: Cardiomediastinal silhouette is stable.  No acute infiltrate or pulmonary edema.  Central mild bronchitic changes. Bony thorax is stable.  IMPRESSION: No acute infiltrate or pulmonary edema.  Central mild bronchitic changes.   Original Report Authenticated By: Natasha Mead, M.D.      No diagnosis found.  9:36 PM Pt had > 400 cc uop.  Ambulated in ed.  dests on ra to 80s.  Will admit  Spoke with [facility].  He will come admit.    MDM  Congestive heart failure DOE, hypoxia with ambulation. Pt not on oxygen at home.         Cheri Guppy, MD 09/19/12 1610  Cheri Guppy, MD 09/19/12 2138

## 2012-09-20 ENCOUNTER — Encounter (HOSPITAL_COMMUNITY): Payer: Self-pay | Admitting: *Deleted

## 2012-09-20 DIAGNOSIS — IMO0001 Reserved for inherently not codable concepts without codable children: Secondary | ICD-10-CM

## 2012-09-20 DIAGNOSIS — I509 Heart failure, unspecified: Secondary | ICD-10-CM

## 2012-09-20 DIAGNOSIS — I5022 Chronic systolic (congestive) heart failure: Secondary | ICD-10-CM

## 2012-09-20 DIAGNOSIS — I059 Rheumatic mitral valve disease, unspecified: Secondary | ICD-10-CM

## 2012-09-20 DIAGNOSIS — I10 Essential (primary) hypertension: Secondary | ICD-10-CM

## 2012-09-20 LAB — GLUCOSE, CAPILLARY
Glucose-Capillary: 306 mg/dL — ABNORMAL HIGH (ref 70–99)
Glucose-Capillary: 315 mg/dL — ABNORMAL HIGH (ref 70–99)

## 2012-09-20 LAB — BASIC METABOLIC PANEL
CO2: 26 mEq/L (ref 19–32)
Calcium: 9.1 mg/dL (ref 8.4–10.5)
Chloride: 101 mEq/L (ref 96–112)
Creatinine, Ser: 0.91 mg/dL (ref 0.50–1.35)
Glucose, Bld: 303 mg/dL — ABNORMAL HIGH (ref 70–99)

## 2012-09-20 LAB — CBC
HCT: 37.2 % — ABNORMAL LOW (ref 39.0–52.0)
Hemoglobin: 12.2 g/dL — ABNORMAL LOW (ref 13.0–17.0)
Hemoglobin: 12.5 g/dL — ABNORMAL LOW (ref 13.0–17.0)
MCH: 26.9 pg (ref 26.0–34.0)
MCHC: 32.8 g/dL (ref 30.0–36.0)
MCV: 82.5 fL (ref 78.0–100.0)
MCV: 82.5 fL (ref 78.0–100.0)
RBC: 4.64 MIL/uL (ref 4.22–5.81)
RDW: 13.7 % (ref 11.5–15.5)
WBC: 7.9 10*3/uL (ref 4.0–10.5)
WBC: 9.2 10*3/uL (ref 4.0–10.5)

## 2012-09-20 LAB — CREATININE, SERUM
GFR calc Af Amer: >90 mL/min (ref >90)
GFR calc non Af Amer: >90 mL/min (ref >90)

## 2012-09-20 LAB — HEMOGLOBIN A1C: Mean Plasma Glucose: 246 mg/dL — ABNORMAL HIGH (ref <117)

## 2012-09-20 LAB — TROPONIN I
Troponin I: <0.3 ng/mL (ref <0.30)
Troponin I: <0.3 ng/mL (ref <0.30)
Troponin I: <0.3 ng/mL (ref <0.30)

## 2012-09-20 LAB — LIPID PANEL: Cholesterol: 130 mg/dL (ref 0–200)

## 2012-09-20 MED ORDER — CARVEDILOL 6.25 MG PO TABS
6.2500 mg | ORAL_TABLET | Freq: Two times a day (BID) | ORAL | Status: DC
  Administered 2012-09-20 – 2012-09-21 (×2): 6.25 mg via ORAL
  Filled 2012-09-20 (×4): qty 1

## 2012-09-20 MED ORDER — METFORMIN HCL 500 MG PO TABS
500.0000 mg | ORAL_TABLET | Freq: Two times a day (BID) | ORAL | Status: DC
  Administered 2012-09-20 – 2012-09-21 (×2): 500 mg via ORAL
  Filled 2012-09-20 (×4): qty 1

## 2012-09-20 MED ORDER — HYDRALAZINE HCL 20 MG/ML IJ SOLN
5.0000 mg | Freq: Four times a day (QID) | INTRAMUSCULAR | Status: DC | PRN
  Administered 2012-09-20: 5 mg via INTRAVENOUS
  Filled 2012-09-20 (×2): qty 0.25

## 2012-09-20 MED ORDER — INFLUENZA VIRUS VACC SPLIT PF IM SUSP
0.5000 mL | INTRAMUSCULAR | Status: AC
  Administered 2012-09-20: 0.5 mL via INTRAMUSCULAR
  Filled 2012-09-20: qty 0.5

## 2012-09-20 MED ORDER — CYCLOBENZAPRINE HCL 10 MG PO TABS
5.0000 mg | ORAL_TABLET | Freq: Three times a day (TID) | ORAL | Status: DC | PRN
  Administered 2012-09-20 – 2012-09-21 (×2): 5 mg via ORAL
  Filled 2012-09-20 (×2): qty 1

## 2012-09-20 MED ORDER — GLIPIZIDE 5 MG PO TABS
5.0000 mg | ORAL_TABLET | Freq: Two times a day (BID) | ORAL | Status: DC
  Administered 2012-09-20 – 2012-09-21 (×2): 5 mg via ORAL
  Filled 2012-09-20 (×4): qty 1

## 2012-09-20 MED ORDER — LISINOPRIL 10 MG PO TABS
10.0000 mg | ORAL_TABLET | Freq: Every day | ORAL | Status: DC
  Administered 2012-09-20 – 2012-09-21 (×2): 10 mg via ORAL
  Filled 2012-09-20 (×2): qty 1

## 2012-09-20 NOTE — Progress Notes (Signed)
Patient bp elevated, (167/108)-SBP 166 manual recheck.  T.Callahan notified, received order for PRN BP med.  Will give as ordered and continue to monitor. Troy Sine

## 2012-09-20 NOTE — Progress Notes (Signed)
[facility] PROGRESS NOTE  Jared Hart:[id number] DOB: [birth year] DOA: 09/19/2012 PCP: Sheila Oats, MD  Assessment/Plan: Principal Problem:  *Acute exacerbation of CHF (congestive heart failure) Active Problems:  HYPERTENSION, BENIGN ESSENTIAL  CARDIOMYOPATHY  SYSTOLIC HEART FAILURE, CHRONIC  1. Acute exacerbation of congestive heart failure. Patient does have a history of chronic systolic congestive heart failure, although he was unable to followup with any provider for many years. He has not been on any cardiac medications for many years. He has been started on IV diuresis and has had good response. 2-D echocardiogram is ordered and is currently pending. I think it would be important to have echocardiogram done prior to discharge, to document ejection fraction as he may need a repeat referral to the heart failure clinic. Patient has seen Dr. Teressa Lower in the past and may need followup with him in the future. We will add ACE inhibitor and beta blocker for now. Followup ejection fraction on echocardiogram. Continue Lasix as he has had significant improvement. Patient's LDL is in acceptable range. 2. Hypertension. Uncontrolled. Start the patient on ACE inhibitors and beta blockers, especially with concomitant history of heart failure. He'll likely need further titration as an outpatient. 3. Uncontrolled diabetes. Restart the patient on metformin, and add glipizide. Hemoglobin A1c is 10  Code Status: Full code Family Communication: Discussed with patient at the bedside Disposition Plan: Possible discharge home after results of echo are obtained   Brief narrative: This gentleman presents to the emergency room with complaints of shortness of breath and pedal edema. He has a history of chronic systolic congestive heart failure but was unable to followup with his cardiologist for many years now. He initially thought he may be developing a bronchitis and began to take over-the-counter  cold medication. When his symptoms worsened, he presents to the emergency room for evaluation where he was found to have a CHF exacerbation.  Consultants:  none  Procedures:  none  Antibiotics:  none  HPI/Subjective: Feeling better today. Breathing is improving. Pedal edema is also improving. No chest pain  Objective: Filed Vitals:   09/20/12 0530 09/20/12 1120 09/20/12 1331 09/20/12 1409  BP: 167/108 171/108 155/94 163/116  Pulse: 105  98 82  Temp: 97.6 F (36.4 C)  98.6 F (37 C)   TempSrc: Oral  Oral   Resp: 20  21 20   Height:      Weight: 108.909 kg (240 lb 1.6 oz)     SpO2: 93%  95%     Intake/Output Summary (Last 24 hours) at 09/20/12 1503 Last data filed at 09/20/12 1334  Gross per 24 hour  Intake    943 ml  Output   5025 ml  Net  -4082 ml   Filed Weights   09/20/12 0105 09/20/12 0530  Weight: 109.6 kg (241 lb 10 oz) 108.909 kg (240 lb 1.6 oz)    Exam:   General:  No acute distress  Cardiovascular: S1, S2, regular rate and rhythm, 1+ pedal edema  Respiratory: Clear to auscultation bilaterally  Abdomen: Soft, nontender, nondistended, bowel sounds are active  Data Reviewed: Basic Metabolic Panel:  Lab 09/20/12 8119 09/20/12 0015 09/19/12 1648  NA 137 -- 136  K 3.8 -- 4.0  CL 101 -- 101  CO2 26 -- 25  GLUCOSE 303* -- 323*  BUN 12 -- 11  CREATININE 0.91 0.99 1.00  CALCIUM 9.1 -- 8.9  MG -- -- --  PHOS -- -- --   Liver Function Tests: No results found for this  basename: AST:5,ALT:5,ALKPHOS:5,BILITOT:5,PROT:5,ALBUMIN:5 in the last 168 hours No results found for this basename: LIPASE:5,AMYLASE:5 in the last 168 hours No results found for this basename: AMMONIA:5 in the last 168 hours CBC:  Lab 09/20/12 0525 09/20/12 0015 09/19/12 1648  WBC 7.9 9.2 8.2  NEUTROABS -- -- --  HGB 12.5* 12.2* 12.8*  HCT 38.3* 37.2* 38.6*  MCV 82.5 82.5 82.5  PLT 215 228 230   Cardiac Enzymes:  Lab 09/20/12 1158 09/20/12 0525 09/20/12 0015  CKTOTAL --  -- --  CKMB -- -- --  CKMBINDEX -- -- --  TROPONINI <0.30 <0.30 <0.30   BNP (last 3 results)  Basename 09/19/12 1648  PROBNP 3236.0*   CBG:  Lab 09/20/12 1124 09/20/12 0632 09/20/12 0114 09/20/12 0045  GLUCAP 258* 315* 306* 263*    No results found for this or any previous visit (from the past 240 hour(s)).   Studies: Dg Chest 2 View  09/19/2012  *RADIOLOGY REPORT*  Clinical Data: Shortness of breath, cough  CHEST - 2 VIEW  Comparison: 04/13/2012  Findings: Cardiomediastinal silhouette is stable.  No acute infiltrate or pulmonary edema.  Central mild bronchitic changes. Bony thorax is stable.  IMPRESSION: No acute infiltrate or pulmonary edema.  Central mild bronchitic changes.   Original Report Authenticated By: Natasha Mead, M.D.     Scheduled Meds:   . aspirin EC  81 mg Oral Daily  . furosemide  40 mg Intravenous Once  . furosemide  40 mg Intravenous Daily  . heparin  5,000 Units Subcutaneous Q8H  . influenza  inactive virus vaccine  0.5 mL Intramuscular Tomorrow-1000  . insulin aspart  0-15 Units Subcutaneous TID WC  . sodium chloride  3 mL Intravenous Q12H   Continuous Infusions:   . DISCONTD: sodium chloride 20 mL/hr (09/19/12 2312)    Principal Problem:  *Acute exacerbation of CHF (congestive heart failure) Active Problems:  HYPERTENSION, BENIGN ESSENTIAL  CARDIOMYOPATHY  SYSTOLIC HEART FAILURE, CHRONIC    Time spent: 25 minutes    MEMON,JEHANZEB  [facility] Pager [phone]. If 8PM-8AM, please contact night-coverage at [email or web address], password [facility] 09/20/2012, 3:03 PM  LOS: 1 day

## 2012-09-20 NOTE — Plan of Care (Signed)
Problem: Consults Goal: Heart Failure Patient Education (See Patient Education module for education specifics.)  Outcome: Progressing HF BOOKLET AND ZONES REVIEWED WITH PATIENT UPON ADMISSION

## 2012-09-20 NOTE — Progress Notes (Signed)
PATIENT ARRIVED TO UNIT FROM ED VIA STRETCHER. PATIENT ALERT, ORIENTED AND AMBULATORY. ADMISSION WEIGHT AND VITAL SIGNS TAKEN- BP SLIGHTLY ELEVATED BUT WITHIN ORDERED PARAMETERS.  FALL AND SAFETY PLAN REVIEWED WITH PATIENT. CALL LIGHT WITHIN REACH. WILL CONTINUE TO MONITOR.  Filed Vitals:   09/20/12 0105  BP: 152/98  Pulse: 104  Temp: 98 F (36.7 C)  Resp: [address] M

## 2012-09-20 NOTE — Progress Notes (Signed)
  Echocardiogram 2D Echocardiogram has been performed.  Jared Hart 09/20/2012, 3:45 PM

## 2012-09-21 LAB — BASIC METABOLIC PANEL
BUN: 14 mg/dL (ref 6–23)
CO2: 28 mEq/L (ref 19–32)
Calcium: 9.3 mg/dL (ref 8.4–10.5)
Creatinine, Ser: 0.83 mg/dL (ref 0.50–1.35)
GFR calc non Af Amer: >90 mL/min (ref >90)
Glucose, Bld: 181 mg/dL — ABNORMAL HIGH (ref 70–99)
Sodium: 139 mEq/L (ref 135–145)

## 2012-09-21 LAB — GLUCOSE, CAPILLARY: Glucose-Capillary: 119 mg/dL — ABNORMAL HIGH (ref 70–99)

## 2012-09-21 MED ORDER — FUROSEMIDE 40 MG PO TABS
40.0000 mg | ORAL_TABLET | Freq: Every day | ORAL | Status: DC
  Filled 2012-09-21: qty 1

## 2012-09-21 MED ORDER — CARVEDILOL 6.25 MG PO TABS: 6.2500 mg | ORAL_TABLET | Freq: Two times a day (BID) | ORAL | Status: DC

## 2012-09-21 MED ORDER — GLIPIZIDE 5 MG PO TABS: 5.0000 mg | ORAL_TABLET | Freq: Two times a day (BID) | ORAL | Status: DC

## 2012-09-21 MED ORDER — POTASSIUM CHLORIDE CRYS ER 20 MEQ PO TBCR: 20.0000 meq | EXTENDED_RELEASE_TABLET | Freq: Every day | ORAL | Status: DC

## 2012-09-21 MED ORDER — POTASSIUM CHLORIDE CRYS ER 20 MEQ PO TBCR
20.0000 meq | EXTENDED_RELEASE_TABLET | Freq: Every day | ORAL | Status: DC
Start: 2012-09-21 — End: 2012-09-21
  Administered 2012-09-21: 20 meq via ORAL
  Filled 2012-09-21: qty 1

## 2012-09-21 MED ORDER — ASPIRIN 81 MG PO TBEC: 81.0000 mg | DELAYED_RELEASE_TABLET | Freq: Every day | ORAL | Status: DC

## 2012-09-21 MED ORDER — FUROSEMIDE 40 MG PO TABS: 40.0000 mg | ORAL_TABLET | Freq: Every day | ORAL | Status: DC

## 2012-09-21 MED ORDER — LISINOPRIL 10 MG PO TABS: 10.0000 mg | ORAL_TABLET | Freq: Every day | ORAL | Status: DC

## 2012-09-21 MED ORDER — METFORMIN HCL 500 MG PO TABS: 500.0000 mg | ORAL_TABLET | Freq: Two times a day (BID) | ORAL | Status: DC

## 2012-09-21 NOTE — Care Management Note (Signed)
    Page 1 of 1   09/21/2012     11:25:06 AM   CARE MANAGEMENT NOTE 09/21/2012  Patient:  Jared Hart, Jared Hart   Account Number:  [id number]  Date Initiated:  09/21/2012  Documentation initiated by:  Tera Mater  Subjective/Objective Assessment:   46yo male admitted with CHF.  Pt. lives at home with his children     Action/Plan:   Discharge planning   Anticipated DC Date:  09/21/2012   Anticipated DC Plan:  HOME/SELF CARE      DC Planning Services  CM consult  Medication Assistance      Choice offered to / List presented to:             Status of service:  Completed, signed off Medicare Important Message given?   (If response is "NO", the following Medicare IM given date fields will be blank) Date Medicare IM given:   Date Additional Medicare IM given:    Discharge Disposition:  HOME/SELF CARE  Per UR Regulation:  Reviewed for med. necessity/level of care/duration of stay  If discussed at Long Length of Stay Meetings, dates discussed:    Comments:  09/26/12 1100 Tera Mater, RN, BSN NCM [phone] In to speak with pt. about medication assistance and obtaining a PCP.  Gave the pt. a list of clinics in the surrounding area that he could call to get appointment with, such as the [facility].  In addition, pt. qualifies for the HF Medication Assistance Program for 34 day free HF medications.  Obtained order from Dr. Jerral Ralph, and faxed rx to the Outpatient Pharm[facility] at the [facility].  Explained this to pt., as well as giving directions to the Pharmacy.  Pt. will dc home today and follow up in Heart Failure clinic next week.

## 2012-09-21 NOTE — Discharge Summary (Signed)
PATIENT DETAILS Name: Jared Hart Age: 46 y.o. Sex: male Date of Birth: Sep 21, 1966 MRN: [id number]. Admit Date: 09/19/2012 Admitting Physician: Hollice Espy, MD JYN:WGNFAOZ,HYQMVHQI, MD  Recommendations for Outpatient Follow-up:  1. Optimize oral hypoglycemics-follow A1C  PRIMARY DISCHARGE DIAGNOSIS:  Principal Problem:  *Acute exacerbation of CHF (congestive heart failure) Active Problems:  HYPERTENSION, BENIGN ESSENTIAL  CARDIOMYOPATHY  SYSTOLIC HEART FAILURE, CHRONIC      PAST MEDICAL HISTORY: Past Medical History  Diagnosis Date  . Diabetes mellitus   . CHF (congestive heart failure)   . Colon cancer   . Hypercholesteremia   . HTN (hypertension)     DISCHARGE MEDICATIONS:   Medication List     As of 09/21/2012 10:52 AM    STOP taking these medications         DAYQUIL PO      NYQUIL PO      TAKE these medications         aspirin 81 MG EC tablet   Take 1 tablet (81 mg total) by mouth daily.      carvedilol 6.25 MG tablet   Commonly known as: COREG   Take 1 tablet (6.25 mg total) by mouth 2 (two) times daily with a meal.      furosemide 40 MG tablet   Commonly known as: LASIX   Take 1 tablet (40 mg total) by mouth daily.      glipiZIDE 5 MG tablet   Commonly known as: GLUCOTROL   Take 1 tablet (5 mg total) by mouth 2 (two) times daily before a meal.      lisinopril 10 MG tablet   Commonly known as: PRINIVIL,ZESTRIL   Take 1 tablet (10 mg total) by mouth daily.      metFORMIN 500 MG tablet   Commonly known as: GLUCOPHAGE   Take 1 tablet (500 mg total) by mouth 2 (two) times daily with a meal.      potassium chloride SA 20 MEQ tablet   Commonly known as: K-DUR,KLOR-CON   Take 1 tablet (20 mEq total) by mouth daily.         BRIEF HPI:  See H&P, Labs, Consult and Test reports for all details in brief,Jared Hart is a 46 y.o. male with PMH significant for HTN, hypertrophic cardiomyopathy, CHF, non-ischemic cardiomyopathy (probably  hypertensive, negative cath in 2011). Presented to ED with c/o SOB and dry cough onset 1-2 weeks ago, [facility] and he has not been able to get refills on his chronic HTN medications (sounds like he was on metformin, pravastatin, and lasix although this is not evident in computer system). Regardless the patient began to develop symptoms of a dry cough, thought he was getting a chest cold and so began taking phenylephrine containing OTC medications (Dayquil).  Unfortunately his symptoms were not alleviated by this, and they continued to worsen (actually probably were exacerbated by this though he isn't sure of that) and so he sought treatment in the ED.  In the ED he was noted to have symptoms of CHF including BLE Edema, pulmonary crackles, and an BNP significantly elevated from his baseline taken in 2011. Hospitalist service has been asked to admit the patient.   CONSULTATIONS:  None  PERTINENT RADIOLOGIC STUDIES: Dg Chest 2 View  09/19/2012  *RADIOLOGY REPORT*  Clinical Data: Shortness of breath, cough  CHEST - 2 VIEW  Comparison: 04/13/2012  Findings: Cardiomediastinal silhouette is stable.  No acute infiltrate or pulmonary edema.  Central mild  bronchitic changes. Bony thorax is stable.  IMPRESSION: No acute infiltrate or pulmonary edema.  Central mild bronchitic changes.   Original Report Authenticated By: Natasha Mead, M.D.      PERTINENT LAB RESULTS: CBC:  Basename 09/20/12 0525 09/20/12 0015  WBC 7.9 9.2  HGB 12.5* 12.2*  HCT 38.3* 37.2*  PLT 215 228   CMET CMP     Component Value Date/Time   NA 139 09/21/2012 0638   K 3.8 09/21/2012 0638   CL 102 09/21/2012 0638   CO2 28 09/21/2012 0638   GLUCOSE 181* 09/21/2012 0638   BUN 14 09/21/2012 0638   CREATININE 0.83 09/21/2012 0638   CALCIUM 9.3 09/21/2012 0638   PROT 6.9 04/13/2012 1136   ALBUMIN 3.6 04/13/2012 1136   AST 15 04/13/2012 1136   ALT 6 04/13/2012 1136   ALKPHOS 88 04/13/2012 1136   BILITOT 0.7  04/13/2012 1136   GFRNONAA >90 09/21/2012 0638   GFRAA >90 09/21/2012 0638    GFR Estimated Creatinine Clearance: 145.9 ml/min (by C-G formula based on Cr of 0.83). No results found for this basename: LIPASE:2,AMYLASE:2 in the last 72 hours  Basename 09/20/12 1158 09/20/12 0525 09/20/12 0015  CKTOTAL -- -- --  CKMB -- -- --  CKMBINDEX -- -- --  TROPONINI <0.30 <0.30 <0.30   No components found with this basename: POCBNP:3 No results found for this basename: DDIMER:2 in the last 72 hours  Basename 09/20/12 0015  HGBA1C 10.2*    Basename 09/20/12 0525  CHOL 130  HDL 21*  LDLCALC 85  TRIG 161  CHOLHDL 6.2  LDLDIRECT --    Basename 09/20/12 0015  TSH 1.533  T4TOTAL --  T3FREE --  THYROIDAB --   No results found for this basename: VITAMINB12:2,FOLATE:2,FERRITIN:2,TIBC:2,IRON:2,RETICCTPCT:2 in the last 72 hours Coags: No results found for this basename: PT:2,INR:2 in the last 72 hours Microbiology: No results found for this or any previous visit (from the past 240 hour(s)).   BRIEF HOSPITAL COURSE:   Principal Problem:  *Acute exacerbation of CHF (congestive heart failure) -acute on chronic systolic heart failure- -patient was admitted with worsening SOB and b/l leg edema. He also had a elevated BNP. Patient unfortunately has been non-compliant with medications for financial issues. Patient was admitted, given IV lasix, started on Beta blockers and ACEI, with these measures he quickly improved. He also had aEcho-9/22-Systolic function was mildly to moderately reduced. The estimated ejection fraction was in the range of 40% to 45%. Akinesis of the mid-distalinferoseptal myocardium. Moderate hypokinesis of the basalanteroseptal Myocardium.Prior Cardiac Cath in 04/2010-no CAD-EF-20-25 percent. He will be discharged to follow up with Dr Gala Romney on 9/30.  HYPERTENSION, BENIGN ESSENTIAL  -controlled with coreg and lisinopril   CARDIOMYOPATHY  -non ischemic  -cardiac cath in  2011 neg for CAD, cardiac enzymes negative this admit   DM  -CBG's controlled  -c/w Glipizide and Metformin on discharge   TODAY-DAY OF DISCHARGE:  Subjective:   Jared Hart today has no headache,no chest abdominal pain,no new weakness tingling or numbness, feels much better wants to go home today.   Objective:   Blood pressure 131/88, pulse 94, temperature 98 F (36.7 C), temperature source Oral, resp. rate 22, height 6\' 1"  (1.854 m), weight 107.1 kg (236 lb 1.8 oz), SpO2 98.00%.  Intake/Output Summary (Last 24 hours) at 09/21/12 1052 Last data filed at 09/21/12 0900  Gross per 24 hour  Intake   1443 ml  Output   6950 ml  Net  -5507 ml  Exam Awake Alert, Oriented *3, No new F.N deficits, Normal affect [place].AT,PERRAL Supple Neck,No JVD, No cervical lymphadenopathy appriciated.  Symmetrical Chest wall movement, Good air movement bilaterally, CTAB RRR,No Gallops,Rubs or new Murmurs, No Parasternal Heave +ve B.Sounds, Abd Soft, Non tender, No organomegaly appriciated, No rebound -guarding or rigidity. No Cyanosis, Clubbing or edema, No new Rash or bruise  DISCHARGE CONDITION: Stable  DISPOSITION: HOME  DISCHARGE INSTRUCTIONS:    Activity:  As tolerated  Diet recommendation: Diabetic Diet Heart Healthy diet       Follow-up Information    Follow up with Arvilla Meres, MD. On 09/28/2012. (Garage Code [id number]  at 1145 )    Contact information:   [address] [place] [address] [phone]            Total Time spent on discharge equals 45 minutes.  SignedJeoffrey Massed 09/21/2012 10:52 AM

## 2012-09-21 NOTE — Progress Notes (Signed)
PATIENT DETAILS Name: Jared Hart Age: 46 y.o. Sex: male Date of Birth: 09-20-1966 Admit Date: 09/19/2012 Admitting Physician Hollice Espy, MD WUJ:WJXBJYN,WGNFAOZH, MD  Subjective: Feels much better, walking in hallway-no Shortness of breath when ambulating. Anxious to be discharged. [facility].  Assessment/Plan: Principal Problem:  *Acute exacerbation of CHF (congestive heart failure) -acute on chronic systolic heart failure-more compensated clinically today, ambulating, lungs clear on exam -Echo-9/22-Systolic function was mildly to moderately reduced. The estimated ejection fraction was in the range of 40% to 45%. Akinesis of the mid-distalinferoseptal myocardium. Moderate hypokinesis of the basalanteroseptal Myocardium. -Cardiac enzymes negative -Cardiac Cath in 04/2010-no CAD-EF-20-25 percent -will continue with Lasix, Coreg, ACEI on discharge, patient to follow up with Dr Gala Romney on 9/30.  Active Problems:  HYPERTENSION, BENIGN ESSENTIAL -controlled with coreg and lisinopril   CARDIOMYOPATHY -non ischemic -cardiac cath in 2011 neg for CAD, cardiac enzymes negative this admit   DM -CBG's controlled -c/w Glipizide and Metformin on discharge   Disposition: D/C Home today  DVT Prophylaxis: ProphylacticHeparin  Code Status: Full code  Procedures:  None    CONSULTS:  None  PHYSICAL EXAM: Vital signs in last 24 hours: Filed Vitals:   09/20/12 1632 09/20/12 2154 09/21/12 0240 09/21/12 0448  BP: 160/105 163/107 152/94 158/84  Pulse: 97 100 86 88  Temp:  97.7 F (36.5 C) 98.1 F (36.7 C) 98 F (36.7 C)  TempSrc:  Oral Oral Oral  Resp:  22 22 22   Height:      Weight:    107.1 kg (236 lb 1.8 oz)  SpO2:  98% 98% 98%    Weight change: -2.5 kg (-5 lb 8.2 oz) Body mass index is 31.15 kg/(m^2).   Gen Exam: Awake and alert with clear speech.   Neck: Supple, No JVD.   Chest: B/L Clear.   CVS: S1 S2 Regular, no  murmurs.  Abdomen: soft, BS +, non tender, non distended.  Extremities: no edema, lower extremities warm to touch. Neurologic: Non Focal.   Skin: No Rash.   Wounds: N/A.    Intake/Output from previous day:  Intake/Output Summary (Last 24 hours) at 09/21/12 1040 Last data filed at 09/21/12 0900  Gross per 24 hour  Intake   1443 ml  Output   6950 ml  Net  -5507 ml     LAB RESULTS: CBC  Lab 09/20/12 0525 09/20/12 0015 09/19/12 1648  WBC 7.9 9.2 8.2  HGB 12.5* 12.2* 12.8*  HCT 38.3* 37.2* 38.6*  PLT 215 228 230  MCV 82.5 82.5 82.5  MCH 26.9 27.1 27.4  MCHC 32.6 32.8 33.2  RDW 13.8 13.7 13.7  LYMPHSABS -- -- --  MONOABS -- -- --  EOSABS -- -- --  BASOSABS -- -- --  BANDABS -- -- --    Chemistries   Lab 09/21/12 0638 09/20/12 0525 09/20/12 0015 09/19/12 1648  NA 139 137 -- 136  K 3.8 3.8 -- 4.0  CL 102 101 -- 101  CO2 28 26 -- 25  GLUCOSE 181* 303* -- 323*  BUN 14 12 -- 11  CREATININE 0.83 0.91 0.99 1.00  CALCIUM 9.3 9.1 -- 8.9  MG -- -- -- --    CBG:  Lab 09/21/12 0625 09/20/12 2051 09/20/12 1603 09/20/12 1124 09/20/12 0632  GLUCAP 171* 119* 189* 258* 315*    GFR Estimated Creatinine Clearance: 145.9 ml/min (by C-G formula based on Cr of 0.83).  Coagulation profile No results found for this basename: INR:5,PROTIME:5 in  the last 168 hours  Cardiac Enzymes  Lab 09/20/12 1158 09/20/12 0525 09/20/12 0015  CKMB -- -- --  TROPONINI <0.30 <0.30 <0.30  MYOGLOBIN -- -- --    No components found with this basename: POCBNP:3 No results found for this basename: DDIMER:2 in the last 72 hours  Basename 09/20/12 0015  HGBA1C 10.2*    Basename 09/20/12 0525  CHOL 130  HDL 21*  LDLCALC 85  TRIG 409  CHOLHDL 6.2  LDLDIRECT --    Basename 09/20/12 0015  TSH 1.533  T4TOTAL --  T3FREE --  THYROIDAB --   No results found for this basename: VITAMINB12:2,FOLATE:2,FERRITIN:2,TIBC:2,IRON:2,RETICCTPCT:2 in the last 72 hours No results found for this  basename: LIPASE:2,AMYLASE:2 in the last 72 hours  Urine Studies No results found for this basename: UACOL:2,UAPR:2,USPG:2,UPH:2,UTP:2,UGL:2,UKET:2,UBIL:2,UHGB:2,UNIT:2,UROB:2,ULEU:2,UEPI:2,UWBC:2,URBC:2,UBAC:2,CAST:2,CRYS:2,UCOM:2,BILUA:2 in the last 72 hours  MICROBIOLOGY: No results found for this or any previous visit (from the past 240 hour(s)).  RADIOLOGY STUDIES/RESULTS: Dg Chest 2 View  09/19/2012  *RADIOLOGY REPORT*  Clinical Data: Shortness of breath, cough  CHEST - 2 VIEW  Comparison: 04/13/2012  Findings: Cardiomediastinal silhouette is stable.  No acute infiltrate or pulmonary edema.  Central mild bronchitic changes. Bony thorax is stable.  IMPRESSION: No acute infiltrate or pulmonary edema.  Central mild bronchitic changes.   Original Report Authenticated By: Natasha Mead, M.D.     MEDICATIONS: Scheduled Meds:   . aspirin EC  81 mg Oral Daily  . carvedilol  6.25 mg Oral BID WC  . furosemide  40 mg Intravenous Daily  . glipiZIDE  5 mg Oral BID AC  . heparin  5,000 Units Subcutaneous Q8H  . influenza  inactive virus vaccine  0.5 mL Intramuscular Tomorrow-1000  . insulin aspart  0-15 Units Subcutaneous TID WC  . lisinopril  10 mg Oral Daily  . metFORMIN  500 mg Oral BID WC  . sodium chloride  3 mL Intravenous Q12H   Continuous Infusions:  PRN Meds:.sodium chloride, acetaminophen, cyclobenzaprine, hydrALAZINE, nitroGLYCERIN, ondansetron (ZOFRAN) IV, sodium chloride  Antibiotics: Anti-infectives    None       Jeoffrey Massed, MD  [facility] Pager:[phone]  If 7PM-7AM, please contact night-coverage [email or web address] Password [email or web address] 09/21/2012, 10:40 AM   LOS: 2 days

## 2012-09-21 NOTE — Progress Notes (Signed)
IV d/c'd.  Tele d/c'd.  Pt d/c'd to home.  Home meds and d/c instructions have been discussed with pt.  Pt denies any questions or concerns at this time.  Pt ambulating off unit and appears in no acute distress. Javien Tesch RN 

## 2012-09-28 ENCOUNTER — Ambulatory Visit (HOSPITAL_COMMUNITY)
Admit: 2012-09-28 | Discharge: 2012-09-28 | Disposition: A | Discharge status: Home / Self Care | Payer: Self-pay | Attending: Internal Medicine | Admitting: Internal Medicine

## 2012-09-28 VITALS — BP 110/78 | HR 93 | Wt 233.5 lb

## 2012-09-28 DIAGNOSIS — I5022 Chronic systolic (congestive) heart failure: Secondary | ICD-10-CM | POA: Insufficient documentation

## 2012-09-28 DIAGNOSIS — R5383 Other fatigue: Secondary | ICD-10-CM

## 2012-09-28 DIAGNOSIS — R5381 Other malaise: Secondary | ICD-10-CM | POA: Insufficient documentation

## 2012-09-28 LAB — BASIC METABOLIC PANEL
BUN: 15 mg/dL (ref 6–23)
Calcium: 9.7 mg/dL (ref 8.4–10.5)
Creatinine, Ser: 0.92 mg/dL (ref 0.50–1.35)
GFR calc Af Amer: >90 mL/min (ref >90)
GFR calc non Af Amer: >90 mL/min (ref >90)
Potassium: 4.2 mEq/L (ref 3.5–5.1)

## 2012-09-28 NOTE — Assessment & Plan Note (Addendum)
Doing well s/p recent hospitalization. NYHA I-II. Volume status looks good. BP well controlled. Will continue current regimen. Check labs today. Will see  Back in 3-4 weeks for continued med titration. Reinforced need for daily weights and reviewed use of sliding scale diuretics. Goal weight at home ~230 pounds.

## 2012-09-28 NOTE — Assessment & Plan Note (Signed)
Suspect he has OSA. Will see him back in 3-4 weeks if he continues to have severe symptoms will refer for sleep study.

## 2012-09-28 NOTE — Progress Notes (Signed)
  Weight Range   Baseline proBNP     HPI:  Jared Hart is a 46 y/o male with HTN and DM2. Initially admitted in May 2011 for dyspnea and CP. Cath with normal coronaries and EF 25% (felt to be hypertensive cardiomyopathy) and marked volume overload.  He followed with [place] for a while and then was lost to f/u.   Admitted last week with recurrent HF after being out of his meds for 6 months. Diuresed and feels better. ECHO EF 40-45%. Now feels much better. Able to get medicines through [employer]. No significant SOB on exertion. Back to work. No edema, orthopnea or PND. Coreg makes him tired.   Wife died earlier in year and he is raising his son. + heavy snoring.    ROS: All systems negative except as listed in HPI, PMH and Problem List.  Past Medical History  Diagnosis Date  . Diabetes mellitus   . CHF (congestive heart failure)   . Colon cancer   . Hypercholesteremia   . HTN (hypertension)     Current Outpatient Prescriptions  Medication Sig Dispense Refill  . aspirin EC 81 MG EC tablet Take 1 tablet (81 mg total) by mouth daily.      . carvedilol (COREG) 6.25 MG tablet Take 1 tablet (6.25 mg total) by mouth 2 (two) times daily with a meal.  60 tablet  0  . furosemide (LASIX) 40 MG tablet Take 1 tablet (40 mg total) by mouth daily.  30 tablet  0  . glipiZIDE (GLUCOTROL) 5 MG tablet Take 1 tablet (5 mg total) by mouth 2 (two) times daily before a meal.  60 tablet  0  . lisinopril (PRINIVIL,ZESTRIL) 10 MG tablet Take 1 tablet (10 mg total) by mouth daily.  30 tablet  0  . metFORMIN (GLUCOPHAGE) 500 MG tablet Take 1 tablet (500 mg total) by mouth 2 (two) times daily with a meal.  60 tablet  0  . potassium chloride SA (K-DUR,KLOR-CON) 20 MEQ tablet Take 1 tablet (20 mEq total) by mouth daily.  30 tablet  0     PHYSICAL EXAM: Filed Vitals:   09/28/12 1150  BP: 110/78  Pulse: 93  Weight: 233 lb 8 oz (105.915 kg)  SpO2: 98%    General:  Well appearing. No resp difficulty HEENT:  normal Neck: supple. JVP flat. Carotids 2+ bilaterally; no bruits. No lymphadenopathy or thryomegaly appreciated. Cor: PMI normal. Regular rate & rhythm. No rubs, gallops or murmurs. Lungs: clear Abdomen: soft, nontender, nondistended. No hepatosplenomegaly. No bruits or masses. Good bowel sounds. Extremities: no cyanosis, clubbing, rash, edema Neuro: alert & orientedx3, cranial nerves grossly intact. Moves all 4 extremities w/o difficulty. Affect pleasant.     ASSESSMENT & PLAN:

## 2012-10-26 ENCOUNTER — Encounter (HOSPITAL_COMMUNITY): Payer: Self-pay

## 2012-10-26 ENCOUNTER — Ambulatory Visit (HOSPITAL_COMMUNITY)
Admission: RE | Admit: 2012-10-26 | Discharge: 2012-10-26 | Disposition: A | Discharge status: Home / Self Care | Payer: Self-pay | Source: Ambulatory Visit | Attending: Internal Medicine | Admitting: Internal Medicine

## 2012-10-26 VITALS — BP 168/106 | HR 83 | Ht 73.0 in | Wt 238.8 lb

## 2012-10-26 DIAGNOSIS — I5022 Chronic systolic (congestive) heart failure: Secondary | ICD-10-CM | POA: Insufficient documentation

## 2012-10-26 DIAGNOSIS — I10 Essential (primary) hypertension: Secondary | ICD-10-CM | POA: Insufficient documentation

## 2012-10-26 NOTE — Patient Instructions (Addendum)
Follow up in 2-3 weeks  Do the following things EVERYDAY: 1) Weigh yourself in the morning before breakfast. Write it down and keep it in a log. 2) Take your medicines as prescribed 3) Eat low salt foods-Limit salt (sodium) to 2000 mg per day.  4) Stay as active as you can everyday 5) Limit all fluids for the day to less than 2 liters 

## 2012-10-26 NOTE — Progress Notes (Signed)
Patient ID: Jared Hart, male   DOB: Mar 11, 1966, 46 y.o.   MRN: [id number]  Weight Range   Baseline proBNP     HPI: Kendricks is a 46 y/o male with HTN and DM2. Initially admitted in May 2011 for dyspnea and CP. Cath with normal coronaries and EF 25% (felt to be hypertensive cardiomyopathy) and marked volume overload.  He followed with [place] for a while and then was lost to f/u.   Discharged from [facility] September 2013 after being treated for  recurrent HF after being out of his meds for 6 months. Diuresed and feels better. ECHO EF 40-45%.  Wife died earlier in year and he is raising his son. + heavy snoring.   He returns for follow up. Denies SOB/PND/orthopnea/dizziness. Weight at home 234 pounds. Out of his medications as of yesterday. Unable to pay for medications. Takes [name] 3 times per week. Works full time on cars.    ROS: All systems negative except as listed in HPI, PMH and Problem List.  Past Medical History  Diagnosis Date  . Diabetes mellitus   . CHF (congestive heart failure)   . Colon cancer   . Hypercholesteremia   . HTN (hypertension)     Current Outpatient Prescriptions  Medication Sig Dispense Refill  . aspirin EC 81 MG EC tablet Take 1 tablet (81 mg total) by mouth daily.      . carvedilol (COREG) 6.25 MG tablet Take 1 tablet (6.25 mg total) by mouth 2 (two) times daily with a meal.  60 tablet  0  . furosemide (LASIX) 40 MG tablet Take 1 tablet (40 mg total) by mouth daily.  30 tablet  0  . glipiZIDE (GLUCOTROL) 5 MG tablet Take 1 tablet (5 mg total) by mouth 2 (two) times daily before a meal.  60 tablet  0  . lisinopril (PRINIVIL,ZESTRIL) 10 MG tablet Take 1 tablet (10 mg total) by mouth daily.  30 tablet  0  . metFORMIN (GLUCOPHAGE) 500 MG tablet Take 1 tablet (500 mg total) by mouth 2 (two) times daily with a meal.  60 tablet  0  . potassium chloride SA (K-DUR,KLOR-CON) 20 MEQ tablet Take 1 tablet (20 mEq total) by mouth daily.  30 tablet  0     PHYSICAL  EXAM: Filed Vitals:   10/26/12 1130  BP: 168/106  Pulse: 83  Height: 6\' 1"  (1.854 m)  Weight: 238 lb 12.8 oz (108.319 kg)  SpO2: 99%    General:  Well appearing. No resp difficulty HEENT: normal Neck: supple. JVP flat. Carotids 2+ bilaterally; no bruits. No lymphadenopathy or thryomegaly appreciated. Cor: PMI normal. Regular rate & rhythm. No rubs, gallops or murmurs. Lungs: clear Abdomen: soft, nontender, nondistended. No hepatosplenomegaly. No bruits or masses. Good bowel sounds. Extremities: no cyanosis, clubbing, rash, edema Neuro: alert & orientedx3, cranial nerves grossly intact. Moves all 4 extremities w/o difficulty. Affect pleasant.     ASSESSMENT & PLAN:

## 2012-10-26 NOTE — Assessment & Plan Note (Signed)
BP elevated in setting of being out of meds. Will restart meds and check back in 2-3 weeks.

## 2012-10-26 NOTE — Assessment & Plan Note (Addendum)
NYHA I. Volume status stable. BP elevated however he has been out of his medications for the last 2 days because he cant pay for them. Restart all HF medications. Provide all HF medications through indigent  fund for 2 months. Reinforced daily weights and medication compline. Follow up in 2-3 weeks.   Patient seen and examined with Tonye Becket, NP. We discussed all aspects of the encounter. I agree with the assessment and plan as stated above. Doing very well. NYHA I. EF improving. Volume status looks good. Will supply him his HF meds. See back in a few weeks. Can titrate meds as needed.

## 2012-11-11 ENCOUNTER — Ambulatory Visit (HOSPITAL_COMMUNITY)
Admission: RE | Admit: 2012-11-11 | Discharge: 2012-11-11 | Disposition: A | Discharge status: Home / Self Care | Payer: Self-pay | Source: Ambulatory Visit | Attending: Internal Medicine | Admitting: Internal Medicine

## 2012-11-11 ENCOUNTER — Encounter (HOSPITAL_COMMUNITY): Payer: Self-pay

## 2012-11-11 VITALS — BP 162/98 | HR 73 | Wt 235.8 lb

## 2012-11-11 DIAGNOSIS — I110 Hypertensive heart disease with heart failure: Secondary | ICD-10-CM | POA: Insufficient documentation

## 2012-11-11 DIAGNOSIS — E119 Type 2 diabetes mellitus without complications: Secondary | ICD-10-CM | POA: Insufficient documentation

## 2012-11-11 DIAGNOSIS — Z79899 Other long term (current) drug therapy: Secondary | ICD-10-CM | POA: Insufficient documentation

## 2012-11-11 DIAGNOSIS — E7800 Pure hypercholesterolemia, unspecified: Secondary | ICD-10-CM | POA: Insufficient documentation

## 2012-11-11 DIAGNOSIS — I5022 Chronic systolic (congestive) heart failure: Secondary | ICD-10-CM

## 2012-11-11 DIAGNOSIS — Z09 Encounter for follow-up examination after completed treatment for conditions other than malignant neoplasm: Secondary | ICD-10-CM | POA: Insufficient documentation

## 2012-11-11 DIAGNOSIS — I10 Essential (primary) hypertension: Secondary | ICD-10-CM

## 2012-11-11 DIAGNOSIS — I509 Heart failure, unspecified: Secondary | ICD-10-CM | POA: Insufficient documentation

## 2012-11-11 DIAGNOSIS — Z85038 Personal history of other malignant neoplasm of large intestine: Secondary | ICD-10-CM | POA: Insufficient documentation

## 2012-11-11 MED ORDER — LISINOPRIL 10 MG PO TABS: 10.0000 mg | ORAL_TABLET | Freq: Two times a day (BID) | ORAL | Status: DC

## 2012-11-11 NOTE — Patient Instructions (Addendum)
Follow up in 2 weeks  Take Lisinopril 10 mg twice a day  Do the following things EVERYDAY: 1) Weigh yourself in the morning before breakfast. Write it down and keep it in a log. 2) Take your medicines as prescribed 3) Eat low salt foods-Limit salt (sodium) to 2000 mg per day.  4) Stay as active as you can everyday 5) Limit all fluids for the day to less than 2 liters

## 2012-11-11 NOTE — Assessment & Plan Note (Signed)
SBP remains > 130. As noted below add lisinopril 10 mg at night.

## 2012-11-11 NOTE — Assessment & Plan Note (Signed)
NYHA I. Volume status stable. Reviewed most recent BMET. Continue lisinopril 10 mg in am and add 10 mg in pm. Follow up in 2 weeks and repeat BMET. Anticipate up titrating carvedilol at next visit.

## 2012-11-11 NOTE — Progress Notes (Signed)
Patient ID: Jared Hart, male   DOB: 1966/10/31, 46 y.o.   MRN: [id number]   Weight Range   Baseline proBNP     HPI: Jared Hart is a 46 y/o male with HTN and DM2. Initially admitted in May 2011 for dyspnea and CP. Cath with normal coronaries and EF 25% (felt to be hypertensive cardiomyopathy) and marked volume overload.  He followed with [place] for a while and then was lost to f/u.   Discharged from [facility] September 2013 after being treated for  recurrent HF after being out of his meds for 6 months. Diuresed and feels better. ECHO EF 40-45%.  Wife died earlier in year and he is raising his son. + heavy snoring.   He returns for follow up. Overall all he feels good. Denies SOB/PND/orthopnea/dizziness. Able to go up steps without difficulty. Weight at home 234 pounds.  Compliant with medications. Takes Jared Hart 3 times per week. Works full time on cars. Following low salt diet and limiting fluid intake. He did take an extra lasix over the weekend after he had a slice pizza.     ROS: All systems negative except as listed in HPI, PMH and Problem List.  Past Medical History  Diagnosis Date  . Diabetes mellitus   . CHF (congestive heart failure)   . Colon cancer   . Hypercholesteremia   . HTN (hypertension)     Current Outpatient Prescriptions  Medication Sig Dispense Refill  . aspirin EC 81 MG EC tablet Take 1 tablet (81 mg total) by mouth daily.      . carvedilol (COREG) 6.25 MG tablet Take 1 tablet (6.25 mg total) by mouth 2 (two) times daily with a meal.  60 tablet  0  . furosemide (LASIX) 40 MG tablet Take 1 tablet (40 mg total) by mouth daily.  30 tablet  0  . glipiZIDE (GLUCOTROL) 5 MG tablet Take 1 tablet (5 mg total) by mouth 2 (two) times daily before a meal.  60 tablet  0  . lisinopril (PRINIVIL,ZESTRIL) 10 MG tablet Take 1 tablet (10 mg total) by mouth daily.  30 tablet  0  . potassium chloride SA (K-DUR,KLOR-CON) 20 MEQ tablet Take 1 tablet (20 mEq total) by mouth daily.  30 tablet  0   . metFORMIN (GLUCOPHAGE) 500 MG tablet Take 1 tablet (500 mg total) by mouth 2 (two) times daily with a meal.  60 tablet  0     PHYSICAL EXAM: Filed Vitals:   11/11/12 1141  BP: 162/98  Pulse: 73  Weight: 235 lb 12.8 oz (106.958 kg)  SpO2: 99%    General:  Well appearing. No resp difficulty HEENT: normal Neck: supple. JVP flat. Carotids 2+ bilaterally; no bruits. No lymphadenopathy or thryomegaly appreciated. Cor: PMI normal. Regular rate & rhythm. No rubs, gallops or murmurs. Lungs: clear Abdomen: soft, nontender, nondistended. No hepatosplenomegaly. No bruits or masses. Good bowel sounds. Extremities: no cyanosis, clubbing, rash, edema Neuro: alert & orientedx3, cranial nerves grossly intact. Moves all 4 extremities w/o difficulty. Affect pleasant.     ASSESSMENT & PLAN:

## 2012-11-11 NOTE — Progress Notes (Signed)
Patient is considered to be at high risk for readmission due to: [x]  Recently admitted for CHF [ ]  >3 admissions in 6 months [ ]  >/=6 ED visits/year  According to the Adherence Estimator tool, the patient is low risk due to commitment, low risk due to concern (safety), and medium risk due to cost.   The patient reports missing about zero doses/week.  Lasix, carvedilol, and lisinopril were discussed in detail regarding how it works, importance of taking it, possible sides effects and how to avoid them. Written information was provided to support the discussion.    Reported weighing on a every other day basis.   He reports not adding salt to anything, but he eats out on a regular basis and is not sure of sodium content. Time spent counseling: 15 min   Doris Cheadle, PharmD Clinical Pharmacist Pager: [phone] Phone: [phone] 11/11/2012 12:09 PM

## 2012-11-25 ENCOUNTER — Ambulatory Visit (HOSPITAL_COMMUNITY)
Admission: RE | Admit: 2012-11-25 | Discharge: 2012-11-25 | Disposition: A | Discharge status: Home / Self Care | Payer: Self-pay | Source: Ambulatory Visit | Attending: Internal Medicine | Admitting: Internal Medicine

## 2012-11-25 VITALS — BP 140/78 | HR 98 | Wt 238.5 lb

## 2012-11-25 DIAGNOSIS — I10 Essential (primary) hypertension: Secondary | ICD-10-CM | POA: Insufficient documentation

## 2012-11-25 DIAGNOSIS — I5022 Chronic systolic (congestive) heart failure: Secondary | ICD-10-CM | POA: Insufficient documentation

## 2012-11-25 DIAGNOSIS — IMO0001 Reserved for inherently not codable concepts without codable children: Secondary | ICD-10-CM | POA: Insufficient documentation

## 2012-11-25 LAB — BASIC METABOLIC PANEL
BUN: 18 mg/dL (ref 6–23)
Calcium: 9.5 mg/dL (ref 8.4–10.5)
Creatinine, Ser: 0.85 mg/dL (ref 0.50–1.35)
GFR calc Af Amer: >90 mL/min (ref >90)
GFR calc non Af Amer: >90 mL/min (ref >90)

## 2012-11-25 MED ORDER — LISINOPRIL 10 MG PO TABS
10.0000 mg | ORAL_TABLET | Freq: Two times a day (BID) | ORAL | Status: DC
Start: 2012-11-25 — End: 2012-12-24

## 2012-11-25 MED ORDER — METFORMIN HCL 500 MG PO TABS: 500.0000 mg | ORAL_TABLET | Freq: Two times a day (BID) | ORAL | Status: DC

## 2012-11-25 MED ORDER — GLIPIZIDE 5 MG PO TABS: 5.0000 mg | ORAL_TABLET | Freq: Two times a day (BID) | ORAL | Status: DC

## 2012-11-25 MED ORDER — CARVEDILOL 12.5 MG PO TABS: 12.5000 mg | ORAL_TABLET | Freq: Two times a day (BID) | ORAL | Status: DC

## 2012-11-25 NOTE — Assessment & Plan Note (Addendum)
Will refill metformin 500 bid. Needs a statin. Start simva 20.

## 2012-11-25 NOTE — Assessment & Plan Note (Addendum)
Doing well. NYHA I. Volume status looks good. Doing well with sliding scale diuretics. Will increase carvedilol to 12.5 bid. Due for echo at next visit. Check BMET and BNP.

## 2012-11-25 NOTE — Addendum Note (Signed)
Encounter addended by: Noralee Space, RN on: 11/25/2012 11:01 AM<BR>     Documentation filed: Patient Instructions Section, Orders

## 2012-11-25 NOTE — Progress Notes (Signed)
Patient ID: Jared Hart, male   DOB: October 29, 1966, 46 y.o.   MRN: [id number]   Weight Range   Baseline proBNP     HPI: [name] is a 46 y/o male with HTN and DM2. Initially admitted in May 2011 for dyspnea and CP. Cath with normal coronaries and EF 25% (felt to be hypertensive cardiomyopathy) and marked volume overload.  He followed with [place] for a while and then was lost to f/u.   Discharged from [facility] September 2013 after being treated for  recurrent HF after being out of his meds for 6 months. Diuresed and feels better. ECHO EF 40-45%.  Wife died earlier in year and he is raising his son.   He returns for follow up. Overall all he feels good. Denies SOB/PND/orthopnea/dizziness. Weight at home stable 232-234 pounds.  At last visit lisinopril increased to 10 bid. Tolerating well.  Following low salt diet and limiting fluid intake. He takes extra lasix as needed after a big meal. Ran out of his DM2 meds - blood sugars around 200.    ROS: All systems negative except as listed in HPI, PMH and Problem List.  Past Medical History  Diagnosis Date  . Diabetes mellitus   . CHF (congestive heart failure)   . Colon cancer   . Hypercholesteremia   . HTN (hypertension)     Current Outpatient Prescriptions  Medication Sig Dispense Refill  . aspirin EC 81 MG EC tablet Take 1 tablet (81 mg total) by mouth daily.      . carvedilol (COREG) 6.25 MG tablet Take 1 tablet (6.25 mg total) by mouth 2 (two) times daily with a meal.  60 tablet  0  . furosemide (LASIX) 40 MG tablet Take 1 tablet (40 mg total) by mouth daily.  30 tablet  0  . glipiZIDE (GLUCOTROL) 5 MG tablet Take 1 tablet (5 mg total) by mouth 2 (two) times daily before a meal.  60 tablet  0  . lisinopril (PRINIVIL,ZESTRIL) 10 MG tablet Take 1 tablet (10 mg total) by mouth 2 (two) times daily.  60 tablet  3  . metFORMIN (GLUCOPHAGE) 500 MG tablet Take 1 tablet (500 mg total) by mouth 2 (two) times daily with a meal.  60 tablet  0  . potassium  chloride SA (K-DUR,KLOR-CON) 20 MEQ tablet Take 1 tablet (20 mEq total) by mouth daily.  30 tablet  0     PHYSICAL EXAM: Filed Vitals:   11/25/12 1038  BP: 140/78  Pulse: 98  Weight: 238 lb 8 oz (108.183 kg)  SpO2: 99%    General:  Well appearing. No resp difficulty HEENT: normal Neck: supple. JVP flat. Carotids 2+ bilaterally; no bruits. No lymphadenopathy or thryomegaly appreciated. Cor: PMI normal. Regular rate & rhythm. No rubs, gallops or murmurs. Lungs: clear Abdomen: soft, nontender, nondistended. No hepatosplenomegaly. No bruits or masses. Good bowel sounds. Extremities: no cyanosis, clubbing, rash, edema Neuro: alert & orientedx3, cranial nerves grossly intact. Moves all 4 extremities w/o difficulty. Affect pleasant.     ASSESSMENT & PLAN:

## 2012-11-25 NOTE — Addendum Note (Signed)
Encounter addended by: Noralee Space, RN on: 11/25/2012 11:15 AM<BR>     Documentation filed: Patient Instructions Section, Orders

## 2012-11-25 NOTE — Patient Instructions (Addendum)
Increase Carvedilol to 12.5 mg (2 tabs Twice daily until you run out then get new prescription)  Your physician has requested that you have an echocardiogram. Echocardiography is a painless test that uses sound waves to create images of your heart. It provides your doctor with information about the size and shape of your heart and how well your heart's chambers and valves are working. This procedure takes approximately one hour. There are no restrictions for this procedure.  We will contact you in 1 months to schedule your next appointment.

## 2012-12-24 ENCOUNTER — Ambulatory Visit (HOSPITAL_COMMUNITY)
Admission: RE | Admit: 2012-12-24 | Discharge: 2012-12-24 | Disposition: A | Discharge status: Home / Self Care | Payer: Self-pay | Source: Ambulatory Visit | Attending: Internal Medicine | Admitting: Internal Medicine

## 2012-12-24 ENCOUNTER — Ambulatory Visit (HOSPITAL_BASED_OUTPATIENT_CLINIC_OR_DEPARTMENT_OTHER)
Admission: RE | Admit: 2012-12-24 | Discharge: 2012-12-24 | Disposition: A | Discharge status: Home / Self Care | Payer: Self-pay | Source: Ambulatory Visit | Attending: Internal Medicine | Admitting: Internal Medicine

## 2012-12-24 VITALS — BP 158/84 | HR 72 | Wt 246.8 lb

## 2012-12-24 DIAGNOSIS — I10 Essential (primary) hypertension: Secondary | ICD-10-CM

## 2012-12-24 DIAGNOSIS — I428 Other cardiomyopathies: Secondary | ICD-10-CM

## 2012-12-24 DIAGNOSIS — I5022 Chronic systolic (congestive) heart failure: Secondary | ICD-10-CM

## 2012-12-24 DIAGNOSIS — I379 Nonrheumatic pulmonary valve disorder, unspecified: Secondary | ICD-10-CM | POA: Insufficient documentation

## 2012-12-24 DIAGNOSIS — I079 Rheumatic tricuspid valve disease, unspecified: Secondary | ICD-10-CM | POA: Insufficient documentation

## 2012-12-24 DIAGNOSIS — I059 Rheumatic mitral valve disease, unspecified: Secondary | ICD-10-CM | POA: Insufficient documentation

## 2012-12-24 DIAGNOSIS — I369 Nonrheumatic tricuspid valve disorder, unspecified: Secondary | ICD-10-CM

## 2012-12-24 MED ORDER — FUROSEMIDE 40 MG PO TABS: 40.0000 mg | ORAL_TABLET | Freq: Every day | ORAL | Status: DC

## 2012-12-24 MED ORDER — LISINOPRIL 20 MG PO TABS: 20.0000 mg | ORAL_TABLET | Freq: Every day | ORAL | Status: DC

## 2012-12-24 NOTE — Assessment & Plan Note (Addendum)
SBP remains> 130. As noted below will increase lisinopril to 20 mg twice a day.   Attending: Agree

## 2012-12-24 NOTE — Addendum Note (Signed)
Encounter addended by: Noralee Space, RN on: 12/24/2012 12:03 PM<BR>     Documentation filed: Orders

## 2012-12-24 NOTE — Patient Instructions (Addendum)
Take Lisinopril 20 mg twice a day  12/31/12 Please get labs drawn at [facility].   Follow up in 2 months.   Do the following things EVERYDAY: 1) Weigh yourself in the morning before breakfast. Write it down and keep it in a log. 2) Take your medicines as prescribed 3) Eat low salt foods-Limit salt (sodium) to 2000 mg per day.  4) Stay as active as you can everyday 5) Limit all fluids for the day to less than 2 liters

## 2012-12-24 NOTE — Progress Notes (Signed)
Patient ID: Jared Hart, male   DOB: 09/06/1966, 46 y.o.   MRN: [id number]   Weight Range   Baseline proBNP     HPI: Amalio is a 46 y/o male with HTN and DM2. Initially admitted in May 2011 for dyspnea and CP. Cath with normal coronaries and EF 25% (felt to be hypertensive cardiomyopathy) and marked volume overload.  He followed with [place] for a while and then was lost to f/u.   Discharged from [facility] September 2013 after being treated for  recurrent HF after being out of his meds for 6 months. Diuresed and feels better. ECHO EF 40-45%.  Wife died earlier in year and he is raising his son.   ECHO 12/24/12 EF 50%  He returns for follow up. Denies SOB/PND/orthopnea/dizziness. Weight at home stable [phone] pounds.  At last visit carvedilol increased to 12.5 mg twice a day. Liberalized his diet over the holidays. Blood sugar at home 120-130. Compliant with medications.    ROS: All systems negative except as listed in HPI, PMH and Problem List.  Past Medical History  Diagnosis Date  . Diabetes mellitus   . CHF (congestive heart failure)   . Colon cancer   . Hypercholesteremia   . HTN (hypertension)     Current Outpatient Prescriptions  Medication Sig Dispense Refill  . aspirin EC 81 MG EC tablet Take 1 tablet (81 mg total) by mouth daily.      . carvedilol (COREG) 12.5 MG tablet Take 1 tablet (12.5 mg total) by mouth 2 (two) times daily with a meal.  60 tablet  3  . furosemide (LASIX) 40 MG tablet Take 1 tablet (40 mg total) by mouth daily.  30 tablet  6  . glipiZIDE (GLUCOTROL) 5 MG tablet Take 1 tablet (5 mg total) by mouth 2 (two) times daily before a meal.  60 tablet  1  . lisinopril (PRINIVIL,ZESTRIL) 10 MG tablet Take 1 tablet (10 mg total) by mouth 2 (two) times daily.  60 tablet  6  . metFORMIN (GLUCOPHAGE) 500 MG tablet Take 1 tablet (500 mg total) by mouth 2 (two) times daily with a meal.  60 tablet  1  . potassium chloride SA (K-DUR,KLOR-CON) 20 MEQ tablet Take 1 tablet (20 mEq  total) by mouth daily.  30 tablet  0     PHYSICAL EXAM: Filed Vitals:   12/24/12 1006  BP: 158/84  Pulse: 72  Weight: 246 lb 12 oz (111.925 kg)  SpO2: 100%    General:  Well appearing. No resp difficulty HEENT: normal Neck: supple. JVP flat. Carotids 2+ bilaterally; no bruits. No lymphadenopathy or thryomegaly appreciated. Cor: PMI normal. Regular rate & rhythm. No rubs, gallops or murmurs. Lungs: clear Abdomen: soft, nontender, nondistended. No hepatosplenomegaly. No bruits or masses. Good bowel sounds. Extremities: no cyanosis, clubbing, rash, edema Neuro: alert & orientedx3, cranial nerves grossly intact. Moves all 4 extremities w/o difficulty. Affect pleasant.     ASSESSMENT & PLAN:

## 2012-12-24 NOTE — Progress Notes (Signed)
  Echocardiogram 2D Echocardiogram has been performed.  Ellender Hose A 12/24/2012, 11:44 AM

## 2012-12-24 NOTE — Assessment & Plan Note (Addendum)
NYHA I. Volume status stable despite weight gain. ECHO reviewed by Dr Gala Romney. EF improving now 45-50%. Reviewed most recent BMET. BP remains> 130. Increase lisinopril to 20 mg bid. Check BMET 12/31/12. Follow up in 6 weeks. Reinforced daily weights and medication compliance.   Patient seen and examined with Tonye Becket, NP. We discussed all aspects of the encounter. I agree with the assessment and plan as stated above.  He is doing great. Echo reviewed personally and EF much improved. Agree with increasing lisinopril. Reinforced need for daily weights and reviewed use of sliding scale diuretics.

## 2012-12-31 ENCOUNTER — Other Ambulatory Visit (INDEPENDENT_AMBULATORY_CARE_PROVIDER_SITE_OTHER): Payer: Self-pay

## 2012-12-31 DIAGNOSIS — I5022 Chronic systolic (congestive) heart failure: Secondary | ICD-10-CM

## 2012-12-31 LAB — BASIC METABOLIC PANEL
Calcium: 9.6 mg/dL (ref 8.4–10.5)
Creatinine, Ser: 0.9 mg/dL (ref 0.4–1.5)

## 2013-01-03 ENCOUNTER — Other Ambulatory Visit: Payer: Self-pay | Admitting: Nurse Practitioner

## 2013-01-03 MED ORDER — POTASSIUM CHLORIDE CRYS ER 20 MEQ PO TBCR: 20.0000 meq | EXTENDED_RELEASE_TABLET | Freq: Every day | ORAL | Status: DC

## 2013-01-03 NOTE — Progress Notes (Signed)
Pt called stating that he is out of his potassium.  Records reviewed and Rx for KDur 20 meq 1 po daily, #30, six refills e-scribed to [facility] on [address].

## 2013-02-25 ENCOUNTER — Encounter (HOSPITAL_COMMUNITY): Payer: Self-pay

## 2013-03-17 ENCOUNTER — Ambulatory Visit (HOSPITAL_COMMUNITY)
Admission: RE | Admit: 2013-03-17 | Discharge: 2013-03-17 | Disposition: A | Discharge status: Home / Self Care | Payer: Self-pay | Source: Ambulatory Visit | Attending: Internal Medicine | Admitting: Internal Medicine

## 2013-03-17 VITALS — BP 180/106 | HR 80 | Wt 263.2 lb

## 2013-03-17 DIAGNOSIS — I5022 Chronic systolic (congestive) heart failure: Secondary | ICD-10-CM | POA: Insufficient documentation

## 2013-03-17 DIAGNOSIS — I10 Essential (primary) hypertension: Secondary | ICD-10-CM | POA: Insufficient documentation

## 2013-03-17 MED ORDER — LISINOPRIL 20 MG PO TABS: 20.0000 mg | ORAL_TABLET | Freq: Two times a day (BID) | ORAL | Status: DC

## 2013-03-17 NOTE — Progress Notes (Signed)
Patient ID: Jared Hart, male   DOB: Oct 01, 1966, 47 y.o.   MRN: [id number]   Weight Range   Baseline proBNP     HPI: Jared Hart is a 47 y/o male with HTN and DM2. Initially admitted in 06-08-11for dyspnea and CP. Cath with normal coronaries and EF 25% (felt to be hypertensive cardiomyopathy) and marked volume overload.  He followed with [place] for a while and then was lost to f/u. Wife died earlier in June 06, 2012.   Discharged from [facility] September 2013 after being treated for  recurrent HF after being out of his meds for 6 months. Diuresed and feels better. ECHO EF 40-45%.    ECHO 12/24/12 EF 50%   12/31/2012 Potassium 3.8 Lisinopril 0.9  He returns for follow up. Last visit lisinopril increased to 20 mg twice a day however he thought he was only supposed to take it once a day. Denies SOB/PND/orthopnea/dizziness. Able to walk up steps without difficulty.  Weight at home trending up 249 pounds.  Says he is under a significant amount of stress at home due to his daughters behavior. Eating high salt foods. Compliant with medications. He has not been exercising due to the weather.    ROS: All systems negative except as listed in HPI, PMH and Problem List.  Past Medical History  Diagnosis Date  . Diabetes mellitus   . CHF (congestive heart failure)   . Colon cancer   . Hypercholesteremia   . HTN (hypertension)     Current Outpatient Prescriptions  Medication Sig Dispense Refill  . aspirin EC 81 MG EC tablet Take 1 tablet (81 mg total) by mouth daily.      . carvedilol (COREG) 12.5 MG tablet Take 1 tablet (12.5 mg total) by mouth 2 (two) times daily with a meal.  60 tablet  3  . furosemide (LASIX) 40 MG tablet Take 1 tablet (40 mg total) by mouth daily.  30 tablet  6  . glipiZIDE (GLUCOTROL) 5 MG tablet Take 1 tablet (5 mg total) by mouth 2 (two) times daily before a meal.  60 tablet  1  . lisinopril (PRINIVIL,ZESTRIL) 20 MG tablet Take 1 tablet (20 mg total) by mouth 2 (two) times daily.  60 tablet  6  .  metFORMIN (GLUCOPHAGE) 500 MG tablet Take 1 tablet (500 mg total) by mouth 2 (two) times daily with a meal.  60 tablet  1  . potassium chloride SA (K-DUR,KLOR-CON) 20 MEQ tablet Take 1 tablet (20 mEq total) by mouth daily.  30 tablet  6   No current facility-administered medications for this encounter.     PHYSICAL EXAM: Filed Vitals:   03/17/13 0958  BP: 180/106  Pulse: 80  Weight: 263 lb 4 oz (119.409 kg)  SpO2: 99%     General:  Well appearing. No resp difficulty HEENT: normal Neck: supple. JVP flat. Carotids 2+ bilaterally; no bruits. No lymphadenopathy or thryomegaly appreciated. Cor: PMI normal. Regular rate & rhythm. No rubs, gallops or murmurs. Lungs: clear Abdomen: soft, nontender, nondistended. No hepatosplenomegaly. No bruits or masses. Good bowel sounds. Extremities: no cyanosis, clubbing, rash, edema Neuro: alert & orientedx3, cranial nerves grossly intact. Moves all 4 extremities w/o difficulty. Affect pleasant.     ASSESSMENT & PLAN:

## 2013-03-17 NOTE — Patient Instructions (Addendum)
Follow up in 2 weeks  Take lisinopril 20 mg twice a day  Do the following things EVERYDAY: 1) Weigh yourself in the morning before breakfast. Write it down and keep it in a log. 2) Take your medicines as prescribed 3) Eat low salt foods-Limit salt (sodium) to 2000 mg per day.  4) Stay as active as you can everyday 5) Limit all fluids for the day to less than 2 liters

## 2013-03-17 NOTE — Assessment & Plan Note (Signed)
Increase lisinopril 20 mg twice a day. Follow up in 2 weeks for BP check.

## 2013-03-17 NOTE — Assessment & Plan Note (Signed)
NYHA I. Volume status stable despite weight gain. Continue current diuretic regimen. Increase lisinopril to 20 mg twice a day as previously ordered to decrease BP. Follow up in 2 weeks and repeat BMET.

## 2013-03-30 ENCOUNTER — Ambulatory Visit (HOSPITAL_COMMUNITY)
Admission: RE | Admit: 2013-03-30 | Discharge: 2013-03-30 | Disposition: A | Discharge status: Home / Self Care | Payer: Self-pay | Source: Ambulatory Visit | Attending: Internal Medicine | Admitting: Internal Medicine

## 2013-03-30 ENCOUNTER — Encounter (HOSPITAL_COMMUNITY): Payer: Self-pay

## 2013-03-30 VITALS — BP 142/80 | HR 74 | Wt 245.1 lb

## 2013-03-30 DIAGNOSIS — I5022 Chronic systolic (congestive) heart failure: Secondary | ICD-10-CM | POA: Insufficient documentation

## 2013-03-30 DIAGNOSIS — I10 Essential (primary) hypertension: Secondary | ICD-10-CM | POA: Insufficient documentation

## 2013-03-30 DIAGNOSIS — I509 Heart failure, unspecified: Secondary | ICD-10-CM | POA: Insufficient documentation

## 2013-03-30 LAB — BASIC METABOLIC PANEL
Calcium: 9.9 mg/dL (ref 8.4–10.5)
GFR calc Af Amer: >90 mL/min (ref >90)
GFR calc non Af Amer: 83 mL/min — ABNORMAL LOW (ref >90)
Potassium: 4.6 mEq/L (ref 3.5–5.1)
Sodium: 139 mEq/L (ref 135–145)

## 2013-03-30 NOTE — Progress Notes (Signed)
Patient ID: NECHEMIA CHIAPPETTA, male   DOB: 1966-12-06, 47 y.o.   MRN: [id number]   Weight Range   Baseline proBNP     HPI: Densel is a 47 y/o male with HTN and DM2. Initially admitted in 2011/05/27for dyspnea and CP. Cath with normal coronaries and EF 25% (felt to be hypertensive cardiomyopathy) and marked volume overload.  He followed with [place] for a while and then was lost to f/u. Wife died earlier in May 25, 2012.   Discharged from [facility] September 2013 after being treated for recurrent HF after being out of his meds for 6 months. Diuresed and feels better. ECHO EF 40-45%.   ECHO 12/24/12 EF 50%   He returns for follow up. Feels great. Last visit lisinopril increased to 20 mg twice a day. Denies SOB/PND/Orthopnea. Compliant with medications. Exercising daily-walking 2 miles per day. Following low salt diet.    ROS: All systems negative except as listed in HPI, PMH and Problem List.  Past Medical History  Diagnosis Date  . Diabetes mellitus   . CHF (congestive heart failure)   . Colon cancer   . Hypercholesteremia   . HTN (hypertension)     Current Outpatient Prescriptions  Medication Sig Dispense Refill  . aspirin EC 81 MG EC tablet Take 1 tablet (81 mg total) by mouth daily.      . carvedilol (COREG) 12.5 MG tablet Take 1 tablet (12.5 mg total) by mouth 2 (two) times daily with a meal.  60 tablet  3  . furosemide (LASIX) 40 MG tablet Take 1 tablet (40 mg total) by mouth daily.  30 tablet  6  . glipiZIDE (GLUCOTROL) 5 MG tablet Take 1 tablet (5 mg total) by mouth 2 (two) times daily before a meal.  60 tablet  1  . lisinopril (PRINIVIL,ZESTRIL) 20 MG tablet Take 1 tablet (20 mg total) by mouth 2 (two) times daily.  60 tablet  6  . metFORMIN (GLUCOPHAGE) 500 MG tablet Take 1 tablet (500 mg total) by mouth 2 (two) times daily with a meal.  60 tablet  1  . potassium chloride SA (K-DUR,KLOR-CON) 20 MEQ tablet Take 1 tablet (20 mEq total) by mouth daily.  30 tablet  6   No current facility-administered  medications for this encounter.     PHYSICAL EXAM: Filed Vitals:   03/30/13 0931  BP: 142/80  Pulse: 74  Weight: 245 lb 1.9 oz (111.186 kg)  SpO2: 99%     General:  Well appearing. No resp difficulty HEENT: normal Neck: supple. JVP flat. Carotids 2+ bilaterally; no bruits. No lymphadenopathy or thryomegaly appreciated. Cor: PMI normal. Regular rate & rhythm. No rubs, gallops or murmurs. Lungs: clear Abdomen: soft, nontender, nondistended. No hepatosplenomegaly. No bruits or masses. Good bowel sounds. Extremities: no cyanosis, clubbing, rash, edema Neuro: alert & orientedx3, cranial nerves grossly intact. Moves all 4 extremities w/o difficulty. Affect pleasant.     ASSESSMENT & PLAN:

## 2013-03-30 NOTE — Assessment & Plan Note (Signed)
SBP improved. Continue current regimen. Check BMET.

## 2013-03-30 NOTE — Assessment & Plan Note (Signed)
NYHA I. Volume status stable. Continue current regimen. Check BMET. Follow up in 3 months.

## 2013-03-30 NOTE — Patient Instructions (Addendum)
Follow up in 3 months   Do the following things EVERYDAY: 1) Weigh yourself in the morning before breakfast. Write it down and keep it in a log. 2) Take your medicines as prescribed 3) Eat low salt foods-Limit salt (sodium) to 2000 mg per day.  4) Stay as active as you can everyday 5) Limit all fluids for the day to less than 2 liters  

## 2013-05-14 ENCOUNTER — Other Ambulatory Visit: Payer: Self-pay | Admitting: Internal Medicine

## 2013-05-15 ENCOUNTER — Other Ambulatory Visit: Payer: Self-pay | Admitting: Physician Assistant

## 2013-05-15 ENCOUNTER — Telehealth: Payer: Self-pay | Admitting: Physician Assistant

## 2013-05-15 MED ORDER — CARVEDILOL 12.5 MG PO TABS: 12.5000 mg | ORAL_TABLET | Freq: Two times a day (BID) | ORAL | Status: DC

## 2013-05-15 NOTE — Telephone Encounter (Signed)
Pt called on sat 5/17 regarding med refill. Requested this on Friday but did not hear back. Saw CHF clinic 03/30/13. Coreg seems appropriate to continue. I e-prescribed coreg 12.5mg  po BID disp 60 with 3 rf to pt's pharmacy [facility] on [address]. He verbalized gratitude. Shamaria Kavan PA-C

## 2013-07-15 ENCOUNTER — Ambulatory Visit (HOSPITAL_COMMUNITY)
Admission: RE | Admit: 2013-07-15 | Discharge: 2013-07-15 | Disposition: A | Discharge status: Home / Self Care | Payer: Self-pay | Source: Ambulatory Visit | Attending: Cardiology | Admitting: Cardiology

## 2013-07-15 ENCOUNTER — Encounter (HOSPITAL_COMMUNITY): Payer: Self-pay

## 2013-07-15 VITALS — BP 162/100 | HR 95 | Wt 248.4 lb

## 2013-07-15 DIAGNOSIS — I5022 Chronic systolic (congestive) heart failure: Secondary | ICD-10-CM | POA: Insufficient documentation

## 2013-07-15 DIAGNOSIS — I10 Essential (primary) hypertension: Secondary | ICD-10-CM | POA: Insufficient documentation

## 2013-07-15 MED ORDER — CARVEDILOL 25 MG PO TABS: 25.0000 mg | ORAL_TABLET | Freq: Two times a day (BID) | ORAL | Status: DC

## 2013-07-15 NOTE — Progress Notes (Signed)
Patient ID: Jared Hart, male   DOB: 05/09/1966, 47 y.o.   MRN: [id number]  Weight Range   Baseline proBNP     HPI: Jared Hart is a 47 y/o male with HTN and DM2. Initially admitted in 05-21-2011for dyspnea and CP. Cath with normal coronaries and EF 25% (felt to be hypertensive cardiomyopathy) and marked volume overload.  He followed with [place] for a while and then was lost to f/u. Wife died earlier in 2012-05-19.   Discharged from [facility] September 2013 after being treated for recurrent HF after being out of his meds for 6 months. Diuresed and feels better. ECHO EF 40-45%.   ECHO 12/24/12 EF 50%   Follow up Feeling good. Not adhering to low salt diet. Denies SOB, orthopnea, or CP. Not walking as much as he was in the spring, too hot. Weight stable. Taking medications as prescribed.   ROS: All systems negative except as listed in HPI, PMH and Problem List.  Past Medical History  Diagnosis Date  . Diabetes mellitus   . CHF (congestive heart failure)   . Colon cancer   . Hypercholesteremia   . HTN (hypertension)     Current Outpatient Prescriptions  Medication Sig Dispense Refill  . aspirin EC 81 MG EC tablet Take 1 tablet (81 mg total) by mouth daily.      . carvedilol (COREG) 12.5 MG tablet Take 1 tablet (12.5 mg total) by mouth 2 (two) times daily with a meal.  60 tablet  3  . furosemide (LASIX) 40 MG tablet Take 1 tablet (40 mg total) by mouth daily.  30 tablet  6  . lisinopril (PRINIVIL,ZESTRIL) 20 MG tablet Take 1 tablet (20 mg total) by mouth 2 (two) times daily.  60 tablet  6  . potassium chloride SA (K-DUR,KLOR-CON) 20 MEQ tablet Take 1 tablet (20 mEq total) by mouth daily.  30 tablet  6  . glipiZIDE (GLUCOTROL) 5 MG tablet Take 1 tablet (5 mg total) by mouth 2 (two) times daily before a meal.  60 tablet  1  . metFORMIN (GLUCOPHAGE) 500 MG tablet Take 1 tablet (500 mg total) by mouth 2 (two) times daily with a meal.  60 tablet  1   No current facility-administered medications for this  encounter.     PHYSICAL EXAM: Filed Vitals:   07/15/13 0902  BP: 162/100  Pulse: 95  Weight: 248 lb 6.4 oz (112.674 kg)  SpO2: 97%  Repeat BP 154/94  General:  Well appearing. No resp difficulty HEENT: normal Neck: supple. JVP flat. Carotids 2+ bilaterally; no bruits. No lymphadenopathy or thryomegaly appreciated. Cor: PMI normal. Regular rate & rhythm. No rubs, gallops or murmurs. +S4 Lungs: clear Abdomen: soft, nontender, nondistended. No hepatosplenomegaly. No bruits or masses. Good bowel sounds. Extremities: no cyanosis, clubbing, rash, edema Neuro: alert & orientedx3, cranial nerves grossly intact. Moves all 4 extremities w/o difficulty. Affect pleasant.   ASSESSMENT & PLAN:  1) Chronic systolic HF EF 50%      NYHA I. Volume status good. Taking medications as prescribed. Reinforced daily weights and following low salt diet. BP elevated will increase coreg to 25 mg BID. Continue lasix and ACE-I  2) HTN- remains mildly elevated this am SBP 150-160s. As above will increase coreg to 25 mg BID. Will follow up in 3 months.   Ulla Potash B 9:37 AM  Patient seen with NP, agree with the above note. Symptomatically doing well.  EF 50% on last echo.  BP running high  and has not been particularly compliant with diet.  We talked extensively about a low salt diet today.  I will increase Coreg to 25 mg bid.  He will followup in 3 months.   Marca Ancona 07/15/2013

## 2013-07-15 NOTE — Patient Instructions (Addendum)
Increase carvedilol to 25 mg twice a day. Will need to take 2 tabs in the am and the pm until you pick up new prescription and then it will be 1 tab in the am and 1 tab in the pm.  Try to start exercising again.  Follow up 3 months.  Do the following things EVERYDAY: 1) Weigh yourself in the morning before breakfast. Write it down and keep it in a log. 2) Take your medicines as prescribed 3) Eat low salt foods-Limit salt (sodium) to 2000 mg per day.  4) Stay as active as you can everyday 5) Limit all fluids for the day to less than 2 liters 6)

## 2013-09-14 ENCOUNTER — Other Ambulatory Visit (HOSPITAL_COMMUNITY): Payer: Self-pay | Admitting: Internal Medicine

## 2013-10-22 ENCOUNTER — Ambulatory Visit (HOSPITAL_COMMUNITY)
Admission: RE | Admit: 2013-10-22 | Discharge: 2013-10-22 | Disposition: A | Discharge status: Home / Self Care | Payer: Self-pay | Source: Ambulatory Visit | Attending: Internal Medicine | Admitting: Internal Medicine

## 2013-10-22 VITALS — BP 162/90 | HR 81 | Wt 243.5 lb

## 2013-10-22 DIAGNOSIS — I10 Essential (primary) hypertension: Secondary | ICD-10-CM | POA: Insufficient documentation

## 2013-10-22 DIAGNOSIS — E119 Type 2 diabetes mellitus without complications: Secondary | ICD-10-CM | POA: Insufficient documentation

## 2013-10-22 DIAGNOSIS — E7800 Pure hypercholesterolemia, unspecified: Secondary | ICD-10-CM | POA: Insufficient documentation

## 2013-10-22 DIAGNOSIS — Z85038 Personal history of other malignant neoplasm of large intestine: Secondary | ICD-10-CM | POA: Insufficient documentation

## 2013-10-22 DIAGNOSIS — Z7982 Long term (current) use of aspirin: Secondary | ICD-10-CM | POA: Insufficient documentation

## 2013-10-22 DIAGNOSIS — I509 Heart failure, unspecified: Secondary | ICD-10-CM | POA: Insufficient documentation

## 2013-10-22 DIAGNOSIS — E785 Hyperlipidemia, unspecified: Secondary | ICD-10-CM

## 2013-10-22 DIAGNOSIS — Z79899 Other long term (current) drug therapy: Secondary | ICD-10-CM | POA: Insufficient documentation

## 2013-10-22 DIAGNOSIS — I5022 Chronic systolic (congestive) heart failure: Secondary | ICD-10-CM | POA: Insufficient documentation

## 2013-10-22 MED ORDER — SPIRONOLACTONE 25 MG PO TABS: 12.5000 mg | ORAL_TABLET | Freq: Every day | ORAL | Status: DC

## 2013-10-22 MED ORDER — POTASSIUM CHLORIDE CRYS ER 20 MEQ PO TBCR: 20.0000 meq | EXTENDED_RELEASE_TABLET | Freq: Every day | ORAL | Status: DC

## 2013-10-22 NOTE — Patient Instructions (Signed)
Stop taking your potassium  Start taking Spironolactone 12.5 mg daily (1/2 tablet)  Check blood work at [address], [place] [address], 3rd floor anytime after 7:30 am on November 3rd.  Will call if abnormal lab results.  Follow up in 3 months with ECHO.  Do the following things EVERYDAY: 1) Weigh yourself in the morning before breakfast. Write it down and keep it in a log. 2) Take your medicines as prescribed 3) Eat low salt foods-Limit salt (sodium) to 2000 mg per day.  4) Stay as active as you can everyday 5) Limit all fluids for the day to less than 2 liters 6)

## 2013-10-22 NOTE — Progress Notes (Signed)
Patient ID: Jared Hart, male   DOB: [birth year], 47 y.o.   MRN: [id number]  Weight Range   Baseline proBNP     HPI: Jared Hart is a 47 y/o male with HTN and DM2. Initially admitted in 2011/05/18for dyspnea and CP. Cath with normal coronaries and EF 25% (felt to be hypertensive cardiomyopathy) and marked volume overload.  He followed with [place] for a while and then was lost to f/u. Wife died earlier in 05-16-12.   Discharged from [facility] September 2013 after being treated for recurrent HF after being out of his meds for 6 months. Diuresed and feels better. ECHO EF 40-45%.   ECHO 12/24/12 EF 50%   Follow up: Last visit increased coreg to 25 mg BID. Taking medications as prescribed. Getting married in April. Feels pretty good. Following a low salt diet. Denies SOB, orthopnea, edema or CP. Exercising by walking daily. Weight 230 lbs. BP at home 120-130/80s. Denies headaches or blurry vision.   Labs     (03/30/13): K+ 4.6, Cr 1/07   ROS: All systems negative except as listed in HPI, PMH and Problem List.  Past Medical History  Diagnosis Date  . Diabetes mellitus   . CHF (congestive heart failure)   . Colon cancer   . Hypercholesteremia   . HTN (hypertension)     Current Outpatient Prescriptions  Medication Sig Dispense Refill  . aspirin EC 81 MG EC tablet Take 1 tablet (81 mg total) by mouth daily.      . carvedilol (COREG) 25 MG tablet Take 1 tablet (25 mg total) by mouth 2 (two) times daily with a meal.  60 tablet  3  . furosemide (LASIX) 40 MG tablet TAKE ONE TABLET BY MOUTH EVERY DAY  30 tablet  6  . glipiZIDE (GLUCOTROL) 5 MG tablet Take 1 tablet (5 mg total) by mouth 2 (two) times daily before a meal.  60 tablet  1  . lisinopril (PRINIVIL,ZESTRIL) 20 MG tablet Take 1 tablet (20 mg total) by mouth 2 (two) times daily.  60 tablet  6  . metFORMIN (GLUCOPHAGE) 500 MG tablet Take 1 tablet (500 mg total) by mouth 2 (two) times daily with a meal.  60 tablet  1  . potassium chloride SA (K-DUR,KLOR-CON) 20  MEQ tablet Take 1 tablet (20 mEq total) by mouth daily.  30 tablet  6   No current facility-administered medications for this encounter.    Filed Vitals:   10/22/13 0956  BP: 162/90  Pulse: 81  Weight: 243 lb 8 oz (110.451 kg)  SpO2: 100%    PHYSICAL EXAM: General:  Well appearing. No resp difficulty HEENT: normal Neck: supple. JVP flat. Carotids 2+ bilaterally; no bruits. No lymphadenopathy or thryomegaly appreciated. Cor: PMI normal. Regular rate & rhythm. No rubs, gallops or murmurs. +S4 Lungs: clear Abdomen: soft, nontender, nondistended. No hepatosplenomegaly. No bruits or masses. Good bowel sounds. Extremities: no cyanosis, clubbing, rash, edema Neuro: alert & orientedx3, cranial nerves grossly intact. Moves all 4 extremities w/o difficulty. Affect pleasant.   ASSESSMENT & PLAN:  1) Chronic systolic HF, NICM,  EF 50% (11/2012) - He is doing great. He currently has NYHA I symptoms and volume status stable. Will continue lasix 40 mg daily. Discussed with the patient if home weight less than 230 lbs to hold lasix dose. - Coreg at goal dose 25 mg BID along with lisinopril 40 mg daily, continue both - Stop potassium. SBP remains elevated will start spiro 12.5 mg daily and  get BMET next week. - Reinforced the need and importance of daily weights, a low sodium diet, and fluid restriction (less than 2 L a day). Instructed to call the HF clinic if weight increases more than 3 lbs overnight or 5 lbs in a week.  2) HTN-  SBP remains elevated. Will continue BB and ACE-I at current doses and start spiro 12.5 mg daily.  F/U 3 months with ECHO. If EF remains stable can push visits back.  Ulla Potash B NP-C 10:26 AM

## 2013-11-01 ENCOUNTER — Other Ambulatory Visit (INDEPENDENT_AMBULATORY_CARE_PROVIDER_SITE_OTHER): Payer: Self-pay

## 2013-11-01 DIAGNOSIS — E785 Hyperlipidemia, unspecified: Secondary | ICD-10-CM

## 2013-11-01 DIAGNOSIS — I5022 Chronic systolic (congestive) heart failure: Secondary | ICD-10-CM

## 2013-11-01 LAB — BASIC METABOLIC PANEL
BUN: 17 mg/dL (ref 6–23)
CO2: 28 mEq/L (ref 19–32)
GFR: 87.77 mL/min (ref >60.00)
Glucose, Bld: 366 mg/dL — ABNORMAL HIGH (ref 70–99)
Potassium: 4.4 mEq/L (ref 3.5–5.1)

## 2013-11-01 LAB — LIPID PANEL
HDL: 34.4 mg/dL — ABNORMAL LOW (ref >39.00)
Triglycerides: 447 mg/dL — ABNORMAL HIGH (ref 0.0–149.0)

## 2013-11-03 ENCOUNTER — Other Ambulatory Visit (HOSPITAL_COMMUNITY): Payer: Self-pay | Admitting: *Deleted

## 2013-11-03 MED ORDER — METFORMIN HCL 500 MG PO TABS: 500.0000 mg | ORAL_TABLET | Freq: Two times a day (BID) | ORAL | Status: DC

## 2013-11-03 MED ORDER — GLIPIZIDE 5 MG PO TABS: 5.0000 mg | ORAL_TABLET | Freq: Two times a day (BID) | ORAL | Status: DC

## 2013-11-04 ENCOUNTER — Emergency Department: Payer: Self-pay | Admitting: Emergency Medicine

## 2013-11-04 LAB — BASIC METABOLIC PANEL
Anion Gap: 3 — ABNORMAL LOW (ref 7–16)
BUN: 13 mg/dL (ref 7–18)
Chloride: 102 mmol/L (ref 98–107)
Co2: 30 mmol/L (ref 21–32)
Creatinine: 0.99 mg/dL (ref 0.60–1.30)
EGFR (Non-African Amer.): >60
Glucose: 295 mg/dL — ABNORMAL HIGH (ref 65–99)
Potassium: 3.8 mmol/L (ref 3.5–5.1)
Sodium: 135 mmol/L — ABNORMAL LOW (ref 136–145)

## 2013-11-04 LAB — URINALYSIS, COMPLETE
Glucose,UR: >=500 mg/dL (ref 0–75)
Ketone: NEGATIVE
Leukocyte Esterase: NEGATIVE
Ph: 5 (ref 4.5–8.0)
Protein: NEGATIVE
RBC,UR: NONE SEEN /HPF (ref 0–5)
Specific Gravity: 1.014 (ref 1.003–1.030)
Squamous Epithelial: NONE SEEN
WBC UR: <1 /HPF (ref 0–5)

## 2013-11-04 LAB — CBC
HCT: 38.7 % — ABNORMAL LOW (ref 40.0–52.0)
HGB: 13.7 g/dL (ref 13.0–18.0)
MCH: 29.6 pg (ref 26.0–34.0)
MCHC: 35.4 g/dL (ref 32.0–36.0)
RBC: 4.62 10*6/uL (ref 4.40–5.90)
WBC: 10.1 10*3/uL (ref 3.8–10.6)

## 2013-11-11 ENCOUNTER — Other Ambulatory Visit: Payer: Self-pay | Admitting: Physician Assistant

## 2013-11-11 ENCOUNTER — Ambulatory Visit: Payer: Self-pay

## 2013-12-17 ENCOUNTER — Ambulatory Visit: Payer: Self-pay | Admitting: Internal Medicine

## 2013-12-20 ENCOUNTER — Emergency Department: Payer: Self-pay | Admitting: Emergency Medicine

## 2013-12-21 ENCOUNTER — Emergency Department: Payer: Self-pay | Admitting: Emergency Medicine

## 2014-01-24 ENCOUNTER — Encounter (HOSPITAL_COMMUNITY): Payer: Self-pay

## 2014-01-24 ENCOUNTER — Telehealth (HOSPITAL_COMMUNITY): Payer: Self-pay

## 2014-01-24 ENCOUNTER — Ambulatory Visit (HOSPITAL_BASED_OUTPATIENT_CLINIC_OR_DEPARTMENT_OTHER)
Admission: RE | Admit: 2014-01-24 | Discharge: 2014-01-24 | Disposition: A | Discharge status: Home / Self Care | Payer: Self-pay | Source: Ambulatory Visit | Attending: Internal Medicine | Admitting: Internal Medicine

## 2014-01-24 ENCOUNTER — Ambulatory Visit (HOSPITAL_COMMUNITY)
Admission: RE | Admit: 2014-01-24 | Discharge: 2014-01-24 | Disposition: A | Discharge status: Home / Self Care | Payer: Self-pay | Source: Ambulatory Visit | Attending: Internal Medicine | Admitting: Internal Medicine

## 2014-01-24 VITALS — BP 142/80 | HR 81 | Wt 253.0 lb

## 2014-01-24 DIAGNOSIS — I5022 Chronic systolic (congestive) heart failure: Secondary | ICD-10-CM

## 2014-01-24 DIAGNOSIS — E119 Type 2 diabetes mellitus without complications: Secondary | ICD-10-CM | POA: Insufficient documentation

## 2014-01-24 DIAGNOSIS — I10 Essential (primary) hypertension: Secondary | ICD-10-CM

## 2014-01-24 DIAGNOSIS — I517 Cardiomegaly: Secondary | ICD-10-CM

## 2014-01-24 LAB — BASIC METABOLIC PANEL
BUN: 17 mg/dL (ref 6–23)
CALCIUM: 9.3 mg/dL (ref 8.4–10.5)
CO2: 25 meq/L (ref 19–32)
CREATININE: 0.82 mg/dL (ref 0.50–1.35)
Chloride: 96 mEq/L (ref 96–112)
GFR calc Af Amer: >90 mL/min (ref >90)
GFR calc non Af Amer: >90 mL/min (ref >90)
GLUCOSE: 419 mg/dL — AB (ref 70–99)
Potassium: 4.5 mEq/L (ref 3.7–5.3)
Sodium: 136 mEq/L — ABNORMAL LOW (ref 137–147)

## 2014-01-24 MED ORDER — PERFLUTREN LIPID MICROSPHERE
1.0000 mL | INTRAVENOUS | Status: DC | PRN
Start: 2014-01-24 — End: 2014-01-25
  Administered 2014-01-24: 2 mL via INTRAVENOUS
  Filled 2014-01-24: qty 10

## 2014-01-24 MED ORDER — PERFLUTREN LIPID MICROSPHERE
INTRAVENOUS | Status: DC
Start: 2014-01-24 — End: 2014-01-24
  Filled 2014-01-24: qty 10

## 2014-01-24 NOTE — Progress Notes (Signed)
Patient ID: Jared Hart, male   DOB: 1966/04/30, 48 y.o.   MRN: [id number] PCP: None HPI: Jared Hart is a 48 y/o male with HTN and DM2. Initially admitted in May 2011 for dyspnea and CP. Cath with normal coronaries and EF 25% (felt to be hypertensive cardiomyopathy) and marked volume overload. He followed with [place] for a while and then was lost to f/u. Wife died earlier in 04/29/2012.   Discharged from [facility] September 2013 after being treated for recurrent HF after being out of his meds for 6 months. Diuresed and feels better. ECHO EF 40-45%.   ECHO 12/24/12 EF 50%  ECHO 01/24/14: EF 45-50%    Follow up: Last visit started spiro 12.5 mg daily. Overall he is feeling great. Denies SOB/PND/Orthopnea. He is exercising and walking on the tread mill 3 miles at a time about 3 days a week. Weight at home 240s. Compliant with medications. Following low sat diet. SBP at home 150 .    Labs     (03/30/13): K+ 4.6, Cr 1/07  (11/01/13): LDL 81, K+ 4.4, Glu 366, Cr 1.0, TG 447, HDL 34   ROS: All systems negative except as listed in HPI, PMH and Problem List.  Past Medical History  Diagnosis Date  . Diabetes mellitus   . CHF (congestive heart failure)   . Colon cancer   . Hypercholesteremia   . HTN (hypertension)     Current Outpatient Prescriptions  Medication Sig Dispense Refill  . aspirin EC 81 MG EC tablet Take 1 tablet (81 mg total) by mouth daily.      . carvedilol (COREG) 25 MG tablet Take 1 tablet (25 mg total) by mouth 2 (two) times daily with a meal.  60 tablet  6  . furosemide (LASIX) 40 MG tablet TAKE ONE TABLET BY MOUTH EVERY DAY  30 tablet  6  . glipiZIDE (GLUCOTROL) 5 MG tablet Take 1 tablet (5 mg total) by mouth 2 (two) times daily before a meal.  60 tablet  1  . lisinopril (PRINIVIL,ZESTRIL) 20 MG tablet Take 1 tablet (20 mg total) by mouth 2 (two) times daily.  60 tablet  6  . metFORMIN (GLUCOPHAGE) 500 MG tablet Take 1 tablet (500 mg total) by mouth 2 (two) times daily with a meal.  60 tablet  1   . spironolactone (ALDACTONE) 25 MG tablet Take 0.5 tablets (12.5 mg total) by mouth daily.  15 tablet  6   No current facility-administered medications for this encounter.   Facility-Administered Medications Ordered in Other Encounters  Medication Dose Route Frequency Provider Last Rate Last Dose  . perflutren lipid microspheres (DEFINITY) IV suspension  1-10 mL Intravenous PRN Jolaine Artist, MD   2 mL at 01/24/14 0909    Filed Vitals:   01/24/14 0933  BP: 142/80  Pulse: 81  Weight: 253 lb (114.76 kg)  SpO2: 96%    PHYSICAL EXAM: General:  Well appearing. No resp difficulty HEENT: normal Neck: supple. JVP flat. Carotids 2+ bilaterally; no bruits. No lymphadenopathy or thryomegaly appreciated. Cor: PMI normal. Regular rate & rhythm. No rubs, gallops or murmurs. +S4 Lungs: clear Abdomen: soft, nontender, nondistended. No hepatosplenomegaly. No bruits or masses. Good bowel sounds. Extremities: no cyanosis, clubbing, rash, edema Neuro: alert & orientedx3, cranial nerves grossly intact. Moves all 4 extremities w/o difficulty. Affect pleasant.   ASSESSMENT & PLAN:  1) Chronic systolic HF, NICM,  EF 66-44% (12/2013) - He is doing great. He currently has NYHA I symptoms and volume  status stable despite weight gain. Continue lasix 40 mg daily.and 12.5 mg spironolactone daily. Increase spiro to 25 mg if potassium ok.  Coreg at goal dose 25 mg BID along with lisinopril 40 mg daily, continue both  Check BMET today - Reinforced the need and importance of daily weights, a low sodium diet, and fluid restriction (less than 2 L a day). Instructed to call the HF clinic if weight increases more than 3 lbs overnight or 5 lbs in a week.  2) HTN-  SBP remains elevated. Will continue BB and ACE-I at current doses and spiro 12.5 mg daily. If potassium ok will increase spironolactone to 25 mg daily.  Will try and set up with PCP  Follow up in 4-6 months  Micky Overturf NP-C 9:35 AM

## 2014-01-24 NOTE — Patient Instructions (Signed)
Follow up in 4-6 months  Do the following things EVERYDAY: 1) Weigh yourself in the morning before breakfast. Write it down and keep it in a log. 2) Take your medicines as prescribed 3) Eat low salt foods-Limit salt (sodium) to 2000 mg per day.  4) Stay as active as you can everyday 5) Limit all fluids for the day to less than 2 liters

## 2014-01-24 NOTE — Progress Notes (Signed)
Echocardiogram 2D Echocardiogram definity has been performed.  Jared Hart 01/24/2014, 9:35 AM

## 2014-01-24 NOTE — Telephone Encounter (Signed)
Left message regarding lab work, will attempt to call back to instruct patient concerning elevated glucose, increasing spiro, and scheduling BMET. Renee Pain

## 2014-01-25 ENCOUNTER — Telehealth (HOSPITAL_COMMUNITY): Payer: Self-pay

## 2014-01-25 ENCOUNTER — Other Ambulatory Visit (HOSPITAL_COMMUNITY): Payer: Self-pay

## 2014-01-25 DIAGNOSIS — I5022 Chronic systolic (congestive) heart failure: Secondary | ICD-10-CM

## 2014-01-25 NOTE — Telephone Encounter (Signed)
Patient returned phone message from yesterday, informed of lab results, instructed to increase spiro to 25 mg (1 tab) daily and go to [address] for lab work Feb 5th for [employer].  Also informed of high serum glucose, patient assured that he had follow up appointment with PCP to address PO diabetic meds.  Instructed to call with any questions/conerns. Renee Pain

## 2014-04-01 ENCOUNTER — Other Ambulatory Visit (HOSPITAL_COMMUNITY): Payer: Self-pay | Admitting: Adult Health

## 2014-04-29 ENCOUNTER — Emergency Department (HOSPITAL_COMMUNITY)
Admission: EM | Admit: 2014-04-29 | Discharge: 2014-04-29 | Disposition: A | Discharge status: Home / Self Care | Payer: Self-pay | Attending: Emergency Medicine | Admitting: Emergency Medicine

## 2014-04-29 ENCOUNTER — Encounter (HOSPITAL_COMMUNITY): Payer: Self-pay | Admitting: Emergency Medicine

## 2014-04-29 ENCOUNTER — Emergency Department (HOSPITAL_COMMUNITY): Payer: Self-pay

## 2014-04-29 DIAGNOSIS — Z79899 Other long term (current) drug therapy: Secondary | ICD-10-CM | POA: Insufficient documentation

## 2014-04-29 DIAGNOSIS — Z7982 Long term (current) use of aspirin: Secondary | ICD-10-CM | POA: Insufficient documentation

## 2014-04-29 DIAGNOSIS — J40 Bronchitis, not specified as acute or chronic: Secondary | ICD-10-CM

## 2014-04-29 DIAGNOSIS — J029 Acute pharyngitis, unspecified: Secondary | ICD-10-CM | POA: Insufficient documentation

## 2014-04-29 DIAGNOSIS — J209 Acute bronchitis, unspecified: Secondary | ICD-10-CM | POA: Insufficient documentation

## 2014-04-29 DIAGNOSIS — I509 Heart failure, unspecified: Secondary | ICD-10-CM | POA: Insufficient documentation

## 2014-04-29 DIAGNOSIS — R739 Hyperglycemia, unspecified: Secondary | ICD-10-CM

## 2014-04-29 DIAGNOSIS — R350 Frequency of micturition: Secondary | ICD-10-CM | POA: Insufficient documentation

## 2014-04-29 DIAGNOSIS — Z85038 Personal history of other malignant neoplasm of large intestine: Secondary | ICD-10-CM | POA: Insufficient documentation

## 2014-04-29 DIAGNOSIS — I10 Essential (primary) hypertension: Secondary | ICD-10-CM | POA: Insufficient documentation

## 2014-04-29 DIAGNOSIS — E119 Type 2 diabetes mellitus without complications: Secondary | ICD-10-CM | POA: Insufficient documentation

## 2014-04-29 MED ORDER — HYDROCOD POLST-CHLORPHEN POLST 10-8 MG/5ML PO LQCR: 5.0000 mL | Freq: Two times a day (BID) | ORAL | Status: DC

## 2014-04-29 MED ORDER — AZITHROMYCIN 250 MG PO TABS: 250.0000 mg | ORAL_TABLET | Freq: Every day | ORAL | Status: AC

## 2014-04-29 MED ORDER — AZITHROMYCIN 250 MG PO TABS
500.0000 mg | ORAL_TABLET | Freq: Once | ORAL | Status: AC
  Administered 2014-04-29: 500 mg via ORAL
  Filled 2014-04-29: qty 2

## 2014-04-29 MED ORDER — METFORMIN HCL 500 MG PO TABS: 500.0000 mg | ORAL_TABLET | Freq: Two times a day (BID) | ORAL | Status: DC

## 2014-04-29 NOTE — Discharge Instructions (Signed)
Please return if you're feeling worse especially if you have worsening shortness of breath or chest pain. Please followup as scheduled next week at the clinic and have your sugar rechecked.  Bronchitis Bronchitis is swelling (inflammation) of the air tubes leading to your lungs (bronchi). This causes mucus and a cough. If the swelling gets bad, you may have trouble breathing. HOME CARE   Rest.  Drink enough fluids to keep your pee (urine) clear or pale yellow (unless you have a condition where you have to watch how much you drink).  Only take medicine as told by your doctor. If you were given antibiotic medicines, finish them even if you start to feel better.  Avoid smoke, irritating chemicals, and strong smells. These make the problem worse. Quit smoking if you smoke. This helps your lungs heal faster.  Use a cool mist humidifier. Change the water in the humidifier every day. You can also sit in the bathroom with hot shower running for 5 10 minutes. Keep the door closed.  See your health care provider as told.  Wash your hands often. GET HELP IF: Your problems do not get better after 1 week. GET HELP RIGHT AWAY IF:   Your fever gets worse.  You have chills.  Your chest hurts.  Your problems breathing get worse.  You have blood in your mucus.  You pass out (faint).  You feel lightheaded.  You have a bad headache.  You throw up (vomit) again and again. MAKE SURE YOU:  Understand these instructions.  Will watch your condition.  Will get help right away if you are not doing well or get worse. Document Released: 06/03/2008 Document Revised: 10/06/2013 Document Reviewed: 08/10/2013 [facility] Patient Information 2014 [place], [place]. High Blood Sugar High blood sugar (hyperglycemia) means that the level of sugar in your blood is higher than it should be. Signs of high blood sugar include:  Feeling thirsty.  Frequent peeing (urinating).  Feeling tired or sleepy.  Dry  mouth.  Vision changes.  Feeling weak.  Feeling hungry but losing weight.  Numbness and tingling in your hands or feet.  Headache. When you ignore these signs, your blood sugar may keep going up. These problems may get worse, and other problems may begin. HOME CARE  Check your blood sugars as told by your doctor. Write down the numbers with the date and time.  Take the right amount of insulin or diabetes pills at the right time. Write down the dose with date and time.  Refill your insulin or diabetes pills before running out.  Watch what you eat. Follow your meal plan.  Drink liquids without sugar, such as water. Check with your doctor if you have kidney or heart disease.  Follow your doctor's orders for exercise. Exercise at the same time of day.  Keep your doctor's appointments. GET HELP RIGHT AWAY IF:   You have trouble thinking or are confused.  You have fast breathing with fruity smelling breath.  You pass out (faint).  You have 2 to 3 days of high blood sugars and you do not know why.  You have chest pain.  You are feeling sick to your stomach (nauseous) or throwing up (vomiting).  You have sudden vision changes. MAKE SURE YOU:   Understand these instructions.  Will watch your condition.  Will get help right away if you are not doing well or get worse. Document Released: 10/13/2009 Document Revised: 03/09/2012 Document Reviewed: 10/13/2009 [facility] Patient Information 2014 [place], [place].

## 2014-04-29 NOTE — ED Notes (Signed)
Pt reports cough (mildly productive greenish sputum) that started on 04/25.  Pt states he initially had a low temp fever.  Pt has tried OTC meds with no relief.

## 2014-04-29 NOTE — ED Notes (Signed)
Family at bedside. 

## 2014-04-29 NOTE — ED Provider Notes (Signed)
CSN: [id number]     Arrival date & time 04/29/14  1028 History   First MD Initiated Contact with Patient 04/29/14 1211     Chief Complaint  Patient presents with  . Cough     (Consider location/radiation/quality/duration/timing/severity/associated sxs/prior Treatment) HPI 48 year old male history of congestive heart failure, diabetes, hypertension who presents today complaining of cough for a week. He states that it began with some scratching in his throat followed by cough and nasal congestion. He states he had some low-grade fever with this. He states the cough is productive of greenish view of. He denies dyspnea. He has not had nausea, vomiting, shortness of breath, or chest pain. He is a diabetic but has not been taking his metformin secondary to losing primary care earlier this year. He is set up to followup at the clinic next week. He is followed for his congestive heart failure your by Dr. Cassell Smiles and. He states he has been taking all of his medicines except his metformin as prescribed. He has not noticed any increased peripheral edema and his weight has stayed steady. He is not a smoker denies any history of lung disease. Past Medical History  Diagnosis Date  . Diabetes mellitus   . CHF (congestive heart failure)   . Colon cancer   . Hypercholesteremia   . HTN (hypertension)    Past Surgical History  Procedure Laterality Date  . Cholecystectomy    . Appendectomy    . Colon surgery     Family History  Problem Relation Age of Onset  . Diabetes    . Cancer    . Hypertension     History  Substance Use Topics  . Smoking status: Never Smoker   . Smokeless tobacco: Not on file  . Alcohol Use: No    Review of Systems  Constitutional: Positive for fever. Negative for unexpected weight change.  HENT: Positive for congestion, rhinorrhea and sore throat.   Eyes: Negative.   Respiratory: Positive for cough.   Cardiovascular: Negative.   Gastrointestinal: Negative.    Genitourinary: Positive for frequency.  Neurological: Negative.   Hematological: Negative.   Psychiatric/Behavioral: Negative.   All other systems reviewed and are negative.     Allergies  Review of patient's allergies indicates no known allergies.  Home Medications   Prior to Admission medications   Medication Sig Start Date End Date Taking? Authorizing Provider  aspirin EC 81 MG EC tablet Take 1 tablet (81 mg total) by mouth daily. 09/21/12  Yes Shanker Kristeen Mans, MD  carvedilol (COREG) 25 MG tablet Take 1 tablet (25 mg total) by mouth 2 (two) times daily with a meal. 11/11/13  Yes Jolaine Artist, MD  furosemide (LASIX) 40 MG tablet Take 40 mg by mouth daily.   Yes Historical Provider, MD  glipiZIDE (GLUCOTROL) 5 MG tablet Take 1 tablet (5 mg total) by mouth 2 (two) times daily before a meal. 11/03/13  Yes Jolaine Artist, MD  lisinopril (PRINIVIL,ZESTRIL) 20 MG tablet Take 20 mg by mouth 2 (two) times daily.   Yes Historical Provider, MD  metFORMIN (GLUCOPHAGE) 500 MG tablet Take 1 tablet (500 mg total) by mouth 2 (two) times daily with a meal. 11/03/13  Yes Jolaine Artist, MD  spironolactone (ALDACTONE) 25 MG tablet Take 25 mg by mouth daily.   Yes Historical Provider, MD   BP 158/81  Pulse 82  Temp(Src) 97.7 F (36.5 C) (Oral)  Resp 16  Ht 6\' 1"  (1.854 m)  Wt  253 lb (114.76 kg)  BMI 33.39 kg/m2  SpO2 95% Physical Exam  Nursing note and vitals reviewed. Constitutional: He is oriented to person, place, and time. He appears well-developed and well-nourished.  HENT:  Head: Normocephalic and atraumatic.  Right Ear: External ear normal.  Left Ear: External ear normal.  Nose: Nose normal.  Mouth/Throat: Oropharynx is clear and moist.  Eyes: Conjunctivae and EOM are normal. Pupils are equal, round, and reactive to light.  Neck: Normal range of motion. Neck supple.  Cardiovascular: Normal rate, regular rhythm, normal heart sounds and intact distal pulses.    Pulmonary/Chest: Effort normal and breath sounds normal.  Abdominal: Soft. Bowel sounds are normal.  Musculoskeletal: Normal range of motion.  Neurological: He is alert and oriented to person, place, and time. He has normal reflexes.  Skin: Skin is warm and dry.  Psychiatric: He has a normal mood and affect. His behavior is normal. Judgment and thought content normal.    ED Course  Procedures (including critical care time) Labs Review Labs Reviewed - No data to display  Imaging Review Dg Chest 2 View  04/29/2014   CLINICAL DATA:  COUGH  EXAM: CHEST  2 VIEW  COMPARISON:  DG CHEST 2 VIEW dated 09/19/2012  FINDINGS: Low lung volumes. The cardiac silhouette is mild-to-moderately enlarged. The lungs are clear. There no acute osseous abnormalities.  IMPRESSION: No active cardiopulmonary disease.   Electronically Signed   By: Margaree Mackintosh M.D.   On: 04/29/2014 11:29     EKG Interpretation None      MDM   Final diagnoses:  Bronchitis  Hyperglycemia    1 cough patient appears to have viral infection. I see no signs or symptoms of congestive heart failure. He has a cough productive of green sputum and although he has no focal infiltrate on his chest x-Andris Brothers, he will be treated with antibiotics given his multiple other comorbidities. 2 hyperglycemia patient's blood sugars elevated at 419. He has not been taking his metformin do to inability to obtain a prescription. He has some symptoms of hyperglycemia with polyuria and polydipsia. His not appear to have any signs of acidosis. He is advised to follow his blood sugar and is given a prescription for metformin. He is following up with the clinic to begin primary care next week.   Patient is advised of return precautions and need for close followup and voices understanding.    Shaune Pollack, MD 04/29/14 1229

## 2014-06-10 ENCOUNTER — Other Ambulatory Visit (HOSPITAL_COMMUNITY): Payer: Self-pay | Admitting: Cardiology

## 2014-06-10 DIAGNOSIS — I509 Heart failure, unspecified: Secondary | ICD-10-CM

## 2014-06-10 MED ORDER — LISINOPRIL 20 MG PO TABS: 20.0000 mg | ORAL_TABLET | Freq: Two times a day (BID) | ORAL | Status: DC

## 2014-06-10 MED ORDER — ASPIRIN 81 MG PO TBEC: 81.0000 mg | DELAYED_RELEASE_TABLET | Freq: Every day | ORAL | Status: DC

## 2014-06-10 MED ORDER — SPIRONOLACTONE 25 MG PO TABS: 25.0000 mg | ORAL_TABLET | Freq: Every day | ORAL | Status: DC

## 2014-06-10 MED ORDER — CARVEDILOL 25 MG PO TABS: 25.0000 mg | ORAL_TABLET | Freq: Two times a day (BID) | ORAL | Status: DC

## 2014-06-10 MED ORDER — FUROSEMIDE 40 MG PO TABS: 40.0000 mg | ORAL_TABLET | Freq: Every day | ORAL | Status: DC

## 2014-07-29 ENCOUNTER — Encounter (HOSPITAL_COMMUNITY): Payer: Self-pay | Admitting: Vascular Surgery

## 2015-04-01 ENCOUNTER — Emergency Department (HOSPITAL_COMMUNITY): Payer: BC Managed Care – PPO

## 2015-04-01 ENCOUNTER — Encounter (HOSPITAL_COMMUNITY): Payer: Self-pay

## 2015-04-01 ENCOUNTER — Observation Stay (HOSPITAL_COMMUNITY)
Admission: EM | Admit: 2015-04-01 | Discharge: 2015-04-04 | Disposition: A | Discharge status: Home / Self Care | Payer: BC Managed Care – PPO | Attending: Internal Medicine | Admitting: Internal Medicine

## 2015-04-01 DIAGNOSIS — Z85038 Personal history of other malignant neoplasm of large intestine: Secondary | ICD-10-CM | POA: Insufficient documentation

## 2015-04-01 DIAGNOSIS — E876 Hypokalemia: Secondary | ICD-10-CM

## 2015-04-01 DIAGNOSIS — I5022 Chronic systolic (congestive) heart failure: Secondary | ICD-10-CM | POA: Diagnosis present

## 2015-04-01 DIAGNOSIS — R0602 Shortness of breath: Principal | ICD-10-CM | POA: Insufficient documentation

## 2015-04-01 DIAGNOSIS — Z7982 Long term (current) use of aspirin: Secondary | ICD-10-CM | POA: Insufficient documentation

## 2015-04-01 DIAGNOSIS — E1165 Type 2 diabetes mellitus with hyperglycemia: Secondary | ICD-10-CM

## 2015-04-01 DIAGNOSIS — J219 Acute bronchiolitis, unspecified: Secondary | ICD-10-CM | POA: Diagnosis present

## 2015-04-01 DIAGNOSIS — I10 Essential (primary) hypertension: Secondary | ICD-10-CM | POA: Diagnosis present

## 2015-04-01 DIAGNOSIS — R000 Tachycardia, unspecified: Secondary | ICD-10-CM | POA: Insufficient documentation

## 2015-04-01 DIAGNOSIS — R42 Dizziness and giddiness: Secondary | ICD-10-CM | POA: Insufficient documentation

## 2015-04-01 DIAGNOSIS — E119 Type 2 diabetes mellitus without complications: Secondary | ICD-10-CM

## 2015-04-01 DIAGNOSIS — D72829 Elevated white blood cell count, unspecified: Secondary | ICD-10-CM | POA: Insufficient documentation

## 2015-04-01 DIAGNOSIS — E785 Hyperlipidemia, unspecified: Secondary | ICD-10-CM | POA: Insufficient documentation

## 2015-04-01 DIAGNOSIS — E669 Obesity, unspecified: Secondary | ICD-10-CM | POA: Diagnosis present

## 2015-04-01 DIAGNOSIS — Z6829 Body mass index (BMI) 29.0-29.9, adult: Secondary | ICD-10-CM | POA: Insufficient documentation

## 2015-04-01 DIAGNOSIS — R509 Fever, unspecified: Secondary | ICD-10-CM | POA: Diagnosis present

## 2015-04-01 LAB — CBC WITH DIFFERENTIAL/PLATELET
Basophils Absolute: 0 10*3/uL (ref 0.0–0.1)
Basophils Relative: 0 % (ref 0–1)
Eosinophils Absolute: 0.3 10*3/uL (ref 0.0–0.7)
Eosinophils Relative: 2 % (ref 0–5)
HCT: 37.2 % — ABNORMAL LOW (ref 39.0–52.0)
HEMOGLOBIN: 12.6 g/dL — AB (ref 13.0–17.0)
LYMPHS PCT: 10 % — AB (ref 12–46)
Lymphs Abs: 1.4 10*3/uL (ref 0.7–4.0)
MCH: 28.8 pg (ref 26.0–34.0)
MCHC: 33.9 g/dL (ref 30.0–36.0)
MCV: 84.9 fL (ref 78.0–100.0)
MONOS PCT: 7 % (ref 3–12)
Monocytes Absolute: 0.9 10*3/uL (ref 0.1–1.0)
NEUTROS PCT: 81 % — AB (ref 43–77)
Neutro Abs: 11.7 10*3/uL — ABNORMAL HIGH (ref 1.7–7.7)
PLATELETS: 224 10*3/uL (ref 150–400)
RBC: 4.38 MIL/uL (ref 4.22–5.81)
RDW: 12.6 % (ref 11.5–15.5)
WBC: 14.4 10*3/uL — ABNORMAL HIGH (ref 4.0–10.5)

## 2015-04-01 LAB — URINALYSIS, ROUTINE W REFLEX MICROSCOPIC
Bilirubin Urine: NEGATIVE
GLUCOSE, UA: 500 mg/dL — AB
Ketones, ur: NEGATIVE mg/dL
Leukocytes, UA: NEGATIVE
NITRITE: NEGATIVE
PH: 5.5 (ref 5.0–8.0)
Protein, ur: 100 mg/dL — AB
Specific Gravity, Urine: 1.008 (ref 1.005–1.030)
Urobilinogen, UA: 1 mg/dL (ref 0.0–1.0)

## 2015-04-01 LAB — COMPREHENSIVE METABOLIC PANEL
ALT: 7 U/L (ref 0–53)
ANION GAP: 12 (ref 5–15)
AST: 15 U/L (ref 0–37)
Albumin: 3.7 g/dL (ref 3.5–5.2)
Alkaline Phosphatase: 82 U/L (ref 39–117)
BUN: 11 mg/dL (ref 6–23)
CALCIUM: 9.4 mg/dL (ref 8.4–10.5)
CHLORIDE: 99 mmol/L (ref 96–112)
CO2: 24 mmol/L (ref 19–32)
Creatinine, Ser: 0.91 mg/dL (ref 0.50–1.35)
GFR calc non Af Amer: >90 mL/min (ref >90)
GLUCOSE: 309 mg/dL — AB (ref 70–99)
Potassium: 4 mmol/L (ref 3.5–5.1)
Sodium: 135 mmol/L (ref 135–145)
TOTAL PROTEIN: 7.4 g/dL (ref 6.0–8.3)
Total Bilirubin: 0.7 mg/dL (ref 0.3–1.2)

## 2015-04-01 LAB — URINE MICROSCOPIC-ADD ON

## 2015-04-01 LAB — I-STAT CG4 LACTIC ACID, ED: Lactic Acid, Venous: 1.42 mmol/L (ref 0.5–2.0)

## 2015-04-01 LAB — BRAIN NATRIURETIC PEPTIDE: B NATRIURETIC PEPTIDE 5: 58.9 pg/mL (ref 0.0–100.0)

## 2015-04-01 MED ORDER — ACETAMINOPHEN 325 MG PO TABS
650.0000 mg | ORAL_TABLET | Freq: Four times a day (QID) | ORAL | Status: DC | PRN
  Administered 2015-04-01: 650 mg via ORAL
  Filled 2015-04-01: qty 2

## 2015-04-01 MED ORDER — HYDROCOD POLST-CHLORPHEN POLST 10-8 MG/5ML PO LQCR
5.0000 mL | Freq: Once | ORAL | Status: AC
  Administered 2015-04-01: 5 mL via ORAL
  Filled 2015-04-01: qty 5

## 2015-04-01 MED ORDER — SODIUM CHLORIDE 0.9 % IV BOLUS (SEPSIS)
1000.0000 mL | Freq: Once | INTRAVENOUS | Status: AC
  Administered 2015-04-01: 1000 mL via INTRAVENOUS

## 2015-04-01 NOTE — ED Provider Notes (Signed)
CSN: [id number]     Arrival date & time 04/01/15  1753 History   First MD Initiated Contact with Patient 04/01/15 1842     Chief Complaint  Patient presents with  . Cough     Patient is a 49 y.o. male presenting with cough. The history is provided by the patient. No language interpreter was used.  Cough  Jared Hart presents for evaluation of cough. He reports he started feeling bad 5 days ago was seen in the [facility] ER and started on an antibiotic and cough medicine. He had a cough with occasional posttussive emesis. The cough is productive of phlegm. He was seen again yesterday because he was not getting better and prescribed a different cough medicine. Today he began feeling considerably worse and developed fevers. He denies any abdominal pain, diarrhea. He states that he hasn't checked his sugars but before they were reading high. Symptoms are moderate, constant, worsening.  Past Medical History  Diagnosis Date  . Diabetes mellitus   . CHF (congestive heart failure)   . Colon cancer   . Hypercholesteremia   . HTN (hypertension)    Past Surgical History  Procedure Laterality Date  . Cholecystectomy    . Appendectomy    . Colon surgery     Family History  Problem Relation Age of Onset  . Diabetes    . Cancer    . Hypertension     History  Substance Use Topics  . Smoking status: Never Smoker   . Smokeless tobacco: Not on file  . Alcohol Use: No    Review of Systems  Respiratory: Positive for cough.   All other systems reviewed and are negative.     Allergies  Review of patient's allergies indicates no known allergies.  Home Medications   Prior to Admission medications   Medication Sig Start Date End Date Taking? Authorizing Provider  aspirin 81 MG EC tablet Take 1 tablet (81 mg total) by mouth daily. 06/10/14   Jolaine Artist, MD  carvedilol (COREG) 25 MG tablet Take 1 tablet (25 mg total) by mouth 2 (two) times daily with a meal. 06/10/14   Jolaine Artist,  MD  chlorpheniramine-HYDROcodone [facility] PENNKINETIC ER) 10-8 MG/5ML LQCR Take 5 mLs by mouth 2 (two) times daily. 04/29/14   Pattricia Boss, MD  furosemide (LASIX) 40 MG tablet Take 1 tablet (40 mg total) by mouth daily. 06/10/14   Jolaine Artist, MD  glipiZIDE (GLUCOTROL) 5 MG tablet Take 1 tablet (5 mg total) by mouth 2 (two) times daily before a meal. 11/03/13   Jolaine Artist, MD  lisinopril (PRINIVIL,ZESTRIL) 20 MG tablet Take 1 tablet (20 mg total) by mouth 2 (two) times daily. 06/10/14   Jolaine Artist, MD  metFORMIN (GLUCOPHAGE) 500 MG tablet Take 1 tablet (500 mg total) by mouth 2 (two) times daily with a meal. 04/29/14   Pattricia Boss, MD  spironolactone (ALDACTONE) 25 MG tablet Take 1 tablet (25 mg total) by mouth daily. 06/10/14   Shaune Pascal Bensimhon, MD   BP 178/92 mmHg  Pulse 130  Temp(Src) 102.7 F (39.3 C) (Oral)  Resp 18  SpO2 97% Physical Exam  Constitutional: He is oriented to person, place, and time. He appears well-developed and well-nourished.  HENT:  Head: Normocephalic and atraumatic.  Cardiovascular: Regular rhythm.   No murmur heard. Tachycardic  Pulmonary/Chest: Effort normal and breath sounds normal. No respiratory distress.  Abdominal: Soft. There is no tenderness. There is no rebound and no  guarding.  Musculoskeletal: He exhibits no edema or tenderness.  Neurological: He is alert and oriented to person, place, and time.  Skin: Skin is warm. He is diaphoretic.  Psychiatric: He has a normal mood and affect. His behavior is normal.  Nursing note and vitals reviewed.   ED Course  Procedures (including critical care time) Labs Review Labs Reviewed  CBC WITH DIFFERENTIAL/PLATELET - Abnormal; Notable for the following:    WBC 14.4 (*)    Hemoglobin 12.6 (*)    HCT 37.2 (*)    Neutrophils Relative % 81 (*)    Neutro Abs 11.7 (*)    Lymphocytes Relative 10 (*)    All other components within normal limits  COMPREHENSIVE METABOLIC PANEL - Abnormal;  Notable for the following:    Glucose, Bld 309 (*)    All other components within normal limits  URINALYSIS, ROUTINE W REFLEX MICROSCOPIC - Abnormal; Notable for the following:    Color, Urine STRAW (*)    APPearance CLOUDY (*)    Glucose, UA 500 (*)    Hgb urine dipstick SMALL (*)    Protein, ur 100 (*)    All other components within normal limits  CULTURE, BLOOD (ROUTINE X 2)  CULTURE, BLOOD (ROUTINE X 2)  URINE CULTURE  URINE MICROSCOPIC-ADD ON  BRAIN NATRIURETIC PEPTIDE  INFLUENZA PANEL BY PCR (TYPE A & B, H1N1)  LACTIC ACID, PLASMA  LACTIC ACID, PLASMA  I-STAT CG4 LACTIC ACID, ED    Imaging Review Dg Chest 2 View  04/01/2015   CLINICAL DATA:  Cough and fever for 4 days  EXAM: CHEST  2 VIEW  COMPARISON:  04/29/2014  FINDINGS: The heart size and mediastinal contours are within normal limits. Both lungs are clear. The visualized skeletal structures are unremarkable.  IMPRESSION: No active cardiopulmonary disease.   Electronically Signed   By: Andreas Newport M.D.   On: 04/01/2015 21:27     EKG Interpretation None      MDM   Final diagnoses:  Febrile illness, acute    Patient here for evaluation of fevers, cough. Patient tachycardic and tachypnea, and initial examination with clear lung examination. Patient's heart rate did improve after IV fluids and Tylenol for fever. There is no current evidence of pneumonia. Discussed with hospitalist regarding admission for observation given fever, leukocytosis.    [name], MD 04/02/15 [name]

## 2015-04-01 NOTE — ED Notes (Signed)
Pt seen in ED @ [facility] 03-28-15 dx: with pnuemonia, finished antibiotic yesterday.  Cough has not improved.  No respiratory difficulties, talking in complete sentences.

## 2015-04-02 ENCOUNTER — Encounter (HOSPITAL_COMMUNITY): Payer: Self-pay

## 2015-04-02 DIAGNOSIS — E669 Obesity, unspecified: Secondary | ICD-10-CM

## 2015-04-02 DIAGNOSIS — J219 Acute bronchiolitis, unspecified: Secondary | ICD-10-CM | POA: Diagnosis present

## 2015-04-02 DIAGNOSIS — I10 Essential (primary) hypertension: Secondary | ICD-10-CM | POA: Diagnosis not present

## 2015-04-02 DIAGNOSIS — E118 Type 2 diabetes mellitus with unspecified complications: Secondary | ICD-10-CM

## 2015-04-02 DIAGNOSIS — I5022 Chronic systolic (congestive) heart failure: Secondary | ICD-10-CM | POA: Diagnosis not present

## 2015-04-02 DIAGNOSIS — E1165 Type 2 diabetes mellitus with hyperglycemia: Secondary | ICD-10-CM

## 2015-04-02 DIAGNOSIS — E876 Hypokalemia: Secondary | ICD-10-CM

## 2015-04-02 DIAGNOSIS — R509 Fever, unspecified: Secondary | ICD-10-CM | POA: Insufficient documentation

## 2015-04-02 LAB — COMPREHENSIVE METABOLIC PANEL
ALT: 7 U/L (ref 0–53)
AST: 12 U/L (ref 0–37)
Albumin: 3.1 g/dL — ABNORMAL LOW (ref 3.5–5.2)
Alkaline Phosphatase: 70 U/L (ref 39–117)
Anion gap: 6 (ref 5–15)
BUN: 6 mg/dL (ref 6–23)
CO2: 27 mmol/L (ref 19–32)
Calcium: 8.2 mg/dL — ABNORMAL LOW (ref 8.4–10.5)
Chloride: 102 mmol/L (ref 96–112)
Creatinine, Ser: 0.75 mg/dL (ref 0.50–1.35)
GFR calc non Af Amer: >90 mL/min (ref >90)
Glucose, Bld: 290 mg/dL — ABNORMAL HIGH (ref 70–99)
POTASSIUM: 3.3 mmol/L — AB (ref 3.5–5.1)
Sodium: 135 mmol/L (ref 135–145)
Total Bilirubin: 0.4 mg/dL (ref 0.3–1.2)
Total Protein: 6.3 g/dL (ref 6.0–8.3)

## 2015-04-02 LAB — CBC
HCT: 32.5 % — ABNORMAL LOW (ref 39.0–52.0)
HEMOGLOBIN: 11.1 g/dL — AB (ref 13.0–17.0)
MCH: 28.6 pg (ref 26.0–34.0)
MCHC: 34.2 g/dL (ref 30.0–36.0)
MCV: 83.8 fL (ref 78.0–100.0)
PLATELETS: 212 10*3/uL (ref 150–400)
RBC: 3.88 MIL/uL — AB (ref 4.22–5.81)
RDW: 12.9 % (ref 11.5–15.5)
WBC: 9.3 10*3/uL (ref 4.0–10.5)

## 2015-04-02 LAB — LACTIC ACID, PLASMA
LACTIC ACID, VENOUS: 0.7 mmol/L (ref 0.5–2.0)
LACTIC ACID, VENOUS: 0.8 mmol/L (ref 0.5–2.0)

## 2015-04-02 LAB — EXPECTORATED SPUTUM ASSESSMENT W GRAM STAIN, RFLX TO RESP C

## 2015-04-02 LAB — INFLUENZA PANEL BY PCR (TYPE A & B)
H1N1 flu by pcr: NOT DETECTED
H1N1 flu by pcr: NOT DETECTED
INFLBPCR: NEGATIVE
INFLBPCR: NEGATIVE
Influenza A By PCR: NEGATIVE
Influenza A By PCR: NEGATIVE

## 2015-04-02 LAB — GLUCOSE, CAPILLARY
GLUCOSE-CAPILLARY: 259 mg/dL — AB (ref 70–99)
GLUCOSE-CAPILLARY: 257 mg/dL — AB (ref 70–99)
GLUCOSE-CAPILLARY: 235 mg/dL — AB (ref 70–99)
Glucose-Capillary: 205 mg/dL — ABNORMAL HIGH (ref 70–99)
Glucose-Capillary: 198 mg/dL — ABNORMAL HIGH (ref 70–99)

## 2015-04-02 LAB — EXPECTORATED SPUTUM ASSESSMENT W REFEX TO RESP CULTURE

## 2015-04-02 MED ORDER — FUROSEMIDE 40 MG PO TABS
40.0000 mg | ORAL_TABLET | Freq: Every day | ORAL | Status: DC
  Administered 2015-04-02 – 2015-04-04 (×3): 40 mg via ORAL
  Filled 2015-04-02 (×3): qty 1

## 2015-04-02 MED ORDER — HEPARIN SODIUM (PORCINE) 5000 UNIT/ML IJ SOLN
5000.0000 [IU] | Freq: Three times a day (TID) | INTRAMUSCULAR | Status: DC
  Administered 2015-04-02 – 2015-04-04 (×6): 5000 [IU] via SUBCUTANEOUS
  Filled 2015-04-02 (×6): qty 1

## 2015-04-02 MED ORDER — IPRATROPIUM-ALBUTEROL 0.5-2.5 (3) MG/3ML IN SOLN
3.0000 mL | RESPIRATORY_TRACT | Status: DC
  Administered 2015-04-02: 3 mL via RESPIRATORY_TRACT
  Filled 2015-04-02 (×2): qty 3

## 2015-04-02 MED ORDER — GUAIFENESIN ER 600 MG PO TB12
600.0000 mg | ORAL_TABLET | Freq: Two times a day (BID) | ORAL | Status: DC
  Administered 2015-04-02 – 2015-04-04 (×6): 600 mg via ORAL
  Filled 2015-04-02 (×6): qty 1

## 2015-04-02 MED ORDER — INFLUENZA VAC SPLIT QUAD 0.5 ML IM SUSY
0.5000 mL | PREFILLED_SYRINGE | INTRAMUSCULAR | Status: AC
  Administered 2015-04-03: 0.5 mL via INTRAMUSCULAR
  Filled 2015-04-02: qty 0.5

## 2015-04-02 MED ORDER — CARVEDILOL 25 MG PO TABS
25.0000 mg | ORAL_TABLET | Freq: Two times a day (BID) | ORAL | Status: DC
  Administered 2015-04-02 – 2015-04-04 (×5): 25 mg via ORAL
  Filled 2015-04-02 (×5): qty 1

## 2015-04-02 MED ORDER — BENZONATATE 100 MG PO CAPS
100.0000 mg | ORAL_CAPSULE | Freq: Three times a day (TID) | ORAL | Status: DC
Start: 2015-04-02 — End: 2015-04-04
  Administered 2015-04-02 – 2015-04-04 (×7): 100 mg via ORAL
  Filled 2015-04-02 (×7): qty 1

## 2015-04-02 MED ORDER — ACETAMINOPHEN 325 MG PO TABS: 650.0000 mg | ORAL_TABLET | Freq: Four times a day (QID) | ORAL | Status: DC | PRN

## 2015-04-02 MED ORDER — ACETAMINOPHEN 650 MG RE SUPP: 650.0000 mg | Freq: Four times a day (QID) | RECTAL | Status: DC | PRN

## 2015-04-02 MED ORDER — SALINE SPRAY 0.65 % NA SOLN
1.0000 | NASAL | Status: DC | PRN
  Filled 2015-04-02: qty 44

## 2015-04-02 MED ORDER — POTASSIUM CHLORIDE CRYS ER 20 MEQ PO TBCR
40.0000 meq | EXTENDED_RELEASE_TABLET | Freq: Once | ORAL | Status: AC
  Administered 2015-04-02: 40 meq via ORAL
  Filled 2015-04-02: qty 2

## 2015-04-02 MED ORDER — LISINOPRIL 20 MG PO TABS
20.0000 mg | ORAL_TABLET | Freq: Two times a day (BID) | ORAL | Status: DC
  Administered 2015-04-02 – 2015-04-04 (×6): 20 mg via ORAL
  Filled 2015-04-02 (×6): qty 1

## 2015-04-02 MED ORDER — ONDANSETRON HCL 4 MG PO TABS: 4.0000 mg | ORAL_TABLET | Freq: Four times a day (QID) | ORAL | Status: DC | PRN

## 2015-04-02 MED ORDER — INSULIN ASPART 100 UNIT/ML ~~LOC~~ SOLN
0.0000 [IU] | Freq: Every day | SUBCUTANEOUS | Status: DC
Start: 2015-04-02 — End: 2015-04-04
  Administered 2015-04-02 (×2): 2 [IU] via SUBCUTANEOUS
  Administered 2015-04-03: 5 [IU] via SUBCUTANEOUS

## 2015-04-02 MED ORDER — ALBUTEROL SULFATE (2.5 MG/3ML) 0.083% IN NEBU: 2.5000 mg | INHALATION_SOLUTION | RESPIRATORY_TRACT | Status: DC | PRN

## 2015-04-02 MED ORDER — SPIRONOLACTONE 25 MG PO TABS
25.0000 mg | ORAL_TABLET | Freq: Every day | ORAL | Status: DC
  Administered 2015-04-02 – 2015-04-04 (×3): 25 mg via ORAL
  Filled 2015-04-02 (×3): qty 1

## 2015-04-02 MED ORDER — ASPIRIN 81 MG PO CHEW
81.0000 mg | CHEWABLE_TABLET | Freq: Every day | ORAL | Status: DC
  Administered 2015-04-02 – 2015-04-04 (×3): 81 mg via ORAL
  Filled 2015-04-02 (×3): qty 1

## 2015-04-02 MED ORDER — IPRATROPIUM-ALBUTEROL 0.5-2.5 (3) MG/3ML IN SOLN
3.0000 mL | Freq: Two times a day (BID) | RESPIRATORY_TRACT | Status: DC
  Administered 2015-04-02 – 2015-04-03 (×3): 3 mL via RESPIRATORY_TRACT
  Filled 2015-04-02 (×4): qty 3

## 2015-04-02 MED ORDER — INSULIN ASPART 100 UNIT/ML ~~LOC~~ SOLN
0.0000 [IU] | Freq: Three times a day (TID) | SUBCUTANEOUS | Status: DC
  Administered 2015-04-02: 3 [IU] via SUBCUTANEOUS
  Administered 2015-04-02 – 2015-04-03 (×3): 8 [IU] via SUBCUTANEOUS
  Administered 2015-04-03: 3 [IU] via SUBCUTANEOUS
  Administered 2015-04-03: 8 [IU] via SUBCUTANEOUS
  Administered 2015-04-04: 5 [IU] via SUBCUTANEOUS

## 2015-04-02 MED ORDER — ONDANSETRON HCL 4 MG/2ML IJ SOLN: 4.0000 mg | Freq: Four times a day (QID) | INTRAMUSCULAR | Status: DC | PRN

## 2015-04-02 NOTE — Progress Notes (Addendum)
PROGRESS NOTE  Jared Hart TKZ:[id number] DOB: 06/06/1966 DOA: 04/01/2015 PCP: Glori Bickers, MD  Brief history 49 year old male with a history of hypertensive cardiomyopathy, systolic CHF, hypertension, diabetes mellitus presented with 2 days of fever and one week of coughing. The patient went to [facility] emergency department approximately 5 days prior to this admission. The patient was placed on levofloxacin for 5 days which he took. His coughing did not improve. He coughed the point of having posttussive emesis. He went back to the emergency department at [facility] on 03/31/2015, and was sent home with antitussive medication. On 04/01/2015, the patient presented to the emergency department at [facility]. The patient was started to be febrile with temperature 100.59F and tachycardic with a heart rate up to 130s.. The patient was given 2 L of NS with improvement of his heart rate. Oxygen saturation was 91-93 percent on room air. He was placed on supplemental oxygen. WBC was noted to be 14.4. Lactic acid was 0.7. Presently, the patient denies any chest pain, shortness breath, vomiting, diarrhea, abdominal pain, dysuria, hematuria, hematochezia, melena. He has a mild frontal headache without any neck pain. There is no rashes or synovitis. Assessment/Plan: Fever/leukocytosis/bronchiolitis -Chest x-ray negative for consolidation -As the patient is hemodynamically stable and improved with fluids, hold off on antibiotics -Follow blood cultures -Influenza PCR negative -Respiratory viral panel -Urinalysis is negative for pyuria Chronic systolic CHF/hypertensive cardiopathy -Appears compensated -Continue furosemide, carvedilol, Aldactone, lisinopril -Continue aspirin 81 mg daily -01/24/2014 echo EF 45-50 percent, diffuse HK Diabetes mellitus type 2 -Check hemoglobin A1c -Hold metformin and glipizide -NovoLog sliding scale Hypertension -Continue carvedilol, lisinopril,  Aldactone Hypokalemia -Replete -Check magnesium   Family Communication:   Pt at beside;  Total time 60 min; >50% spent counseling patient and coordinating care Disposition Plan:   Home when medically stable       Procedures/Studies: Dg Chest 2 View  04/01/2015   CLINICAL DATA:  Cough and fever for 4 days  EXAM: CHEST  2 VIEW  COMPARISON:  04/29/2014  FINDINGS: The heart size and mediastinal contours are within normal limits. Both lungs are clear. The visualized skeletal structures are unremarkable.  IMPRESSION: No active cardiopulmonary disease.   Electronically Signed   By: Andreas Newport M.D.   On: 04/01/2015 21:27         Subjective: Patient is feeling better today. He still has a cough without hemoptysis. Denies any vomiting, diarrhea, vomiting, dysuria, hematuria, rashes, headache  Objective: Filed Vitals:   04/02/15 0231 04/02/15 0341 04/02/15 0524 04/02/15 0709  BP:   162/71   Pulse: 96  95   Temp: 97.7 F (36.5 C)  99.4 F (37.4 C)   TempSrc: Oral  Oral   Resp: 20  22   Height: 6\' 1"  (1.854 m)     Weight: 104.327 kg (230 lb)   104.191 kg (229 lb 11.2 oz)  SpO2: 96% 98% 98%    No intake or output data in the 24 hours ending 04/02/15 0840 Weight change:  Exam:   General:  Pt is alert, follows commands appropriately, not in acute distress  HEENT: No icterus, No thrush, [place]/AT; dry mucous membranes  Cardiovascular: RRR, S1/S2, no rubs, no gallops  Respiratory: Diminished breath sounds left base. Otherwise clear to auscultation bilateral. No wheezing  Abdomen: Soft/+BS, non tender, non distended, no guarding  Extremities: No edema, No lymphangitis, No petechiae, No rashes, no synovitis  Data Reviewed: Basic Metabolic Panel:  Recent Labs Lab 04/01/15 1828 04/02/15 0448  NA 135 135  K 4.0 3.3*  CL 99 102  CO2 24 27  GLUCOSE 309* 290*  BUN 11 6  CREATININE 0.91 0.75  CALCIUM 9.4 8.2*   Liver Function Tests:  Recent Labs Lab 04/01/15 1828  04/02/15 0448  AST 15 12  ALT 7 7  ALKPHOS 82 70  BILITOT 0.7 0.4  PROT 7.4 6.3  ALBUMIN 3.7 3.1*   No results for input(s): LIPASE, AMYLASE in the last 168 hours. No results for input(s): AMMONIA in the last 168 hours. CBC:  Recent Labs Lab 04/01/15 1828 04/02/15 0448  WBC 14.4* 9.3  NEUTROABS 11.7*  --   HGB 12.6* 11.1*  HCT 37.2* 32.5*  MCV 84.9 83.8  PLT 224 212   Cardiac Enzymes: No results for input(s): CKTOTAL, CKMB, CKMBINDEX, TROPONINI in the last 168 hours. BNP: Invalid input(s): POCBNP CBG:  Recent Labs Lab 04/02/15 0253 04/02/15 0751  GLUCAP 205* 198*    No results found for this or any previous visit (from the past 240 hour(s)).   Scheduled Meds: . aspirin  81 mg Oral Daily  . benzonatate  100 mg Oral TID  . carvedilol  25 mg Oral BID WC  . furosemide  40 mg Oral Daily  . guaiFENesin  600 mg Oral BID  . heparin  5,000 Units Subcutaneous 3 times per day  . [START ON 04/03/2015] Influenza vac split quadrivalent PF  0.5 mL Intramuscular Tomorrow-1000  . insulin aspart  0-15 Units Subcutaneous TID WC  . insulin aspart  0-5 Units Subcutaneous QHS  . ipratropium-albuterol  3 mL Nebulization BID  . lisinopril  20 mg Oral BID  . spironolactone  25 mg Oral Daily   Continuous Infusions:    Retha Bither, [place]air[place], DO  [facility] Pager [phone]  If 7PM-7AM, please contact night-coverage [email or web address] Password [email or web address] 04/02/2015, 8:40 AM

## 2015-04-02 NOTE — Progress Notes (Signed)
UR completed 

## 2015-04-02 NOTE — H&P (Signed)
[facility] History and Physical  Patient: Jared Hart  MRN: [id number]  DOB: July 28, 1966  DOS: the patient was seen and examined on 04/02/2015 PCP: Glori Bickers, MD  Chief Complaint: Cough and shortness of breath  HPI: Jared Hart is a 49 y.o. male with Past medical history of diabetes mellitus, chronic systolic heart failure, dyslipidemia, essential hypertension. The patient is presenting with numbness of cough and shortness of breath. On 03/27/2015 the patient was seen in [facility] ER where a chest x-ray was showing multifocal pneumonia and he had a leukocytosis of 19. He was placed on Levaquin 750 mg for 5 days. On 03/31/2015 he was seen there again for worsening of his cough as well as shortness of breath. His chest x-ray and examination did not show any acute abnormality and leukocytosis resolved. Today he started having complaints of fever or chills generalized body ache and malaise. He also has muscle cramps. Complains of fatigue. Also because of dizziness and lightheadedness when he is changing his position. He mentions he is continues to take all his medications regularly and does not have any recent change in his medications. Denies any alcohol or drug abuse. Does not have any chest pain or chest tightness. Does not have any shortness of breath at rest. Denies any diarrhea and denies any burning urination. He has chronic leg swelling which is also unchanged.  The patient is coming from home. And at his baseline independent for most of his ADL.  Review of Systems: as mentioned in the history of present illness.  A Comprehensive review of the other systems is negative.  Past Medical History  Diagnosis Date  . Diabetes mellitus   . CHF (congestive heart failure)   . Colon cancer   . Hypercholesteremia   . HTN (hypertension)    Past Surgical History  Procedure Laterality Date  . Cholecystectomy    . Appendectomy    . Colon surgery     Social History:   reports that he has never smoked. He does not have any smokeless tobacco history on file. He reports that he does not drink alcohol or use illicit drugs.  No Known Allergies  Family History  Problem Relation Age of Onset  . Diabetes    . Cancer    . Hypertension      Prior to Admission medications   Medication Sig Start Date End Date Taking? Authorizing Provider  aspirin 81 MG EC tablet Take 1 tablet (81 mg total) by mouth daily. 06/10/14  Yes Jolaine Artist, MD  carvedilol (COREG) 25 MG tablet Take 1 tablet (25 mg total) by mouth 2 (two) times daily with a meal. 06/10/14  Yes Jolaine Artist, MD  furosemide (LASIX) 40 MG tablet Take 1 tablet (40 mg total) by mouth daily. 06/10/14  Yes Jolaine Artist, MD  glipiZIDE (GLUCOTROL) 5 MG tablet Take 1 tablet (5 mg total) by mouth 2 (two) times daily before a meal. 11/03/13  Yes Jolaine Artist, MD  lisinopril (PRINIVIL,ZESTRIL) 20 MG tablet Take 1 tablet (20 mg total) by mouth 2 (two) times daily. 06/10/14  Yes Jolaine Artist, MD  metFORMIN (GLUCOPHAGE) 500 MG tablet Take 1 tablet (500 mg total) by mouth 2 (two) times daily with a meal. 04/29/14  Yes Pattricia Boss, MD  sodium chloride (OCEAN) 0.65 % SOLN nasal spray Place 1 spray into both nostrils as needed for congestion.   Yes Historical Provider, MD  spironolactone (ALDACTONE) 25 MG tablet Take 1 tablet (  25 mg total) by mouth daily. 06/10/14  Yes Jolaine Artist, MD  chlorpheniramine-HYDROcodone [facility] PENNKINETIC ER) 10-8 MG/5ML LQCR Take 5 mLs by mouth 2 (two) times daily. Patient not taking: Reported on 04/01/2015 04/29/14   Pattricia Boss, MD    Physical Exam: Filed Vitals:   04/02/15 0217 04/02/15 0231 04/02/15 0341 04/02/15 0524  BP:    162/71  Pulse:  96  95  Temp: 99.1 F (37.3 C) 97.7 F (36.5 C)  99.4 F (37.4 C)  TempSrc: Oral Oral  Oral  Resp:  20  22  Height:  6\' 1"  (1.854 m)    Weight:  104.327 kg (230 lb)    SpO2:  96% 98% 98%    General: Alert, Awake  and Oriented to Time, Place and Person. Appear in mild distress Eyes: PERRL ENT: Oral Mucosa clear moist. Neck: no JVD Cardiovascular: S1 and S2 Present, no Murmur, Peripheral Pulses Present Respiratory: Bilateral Air entry equal and Decreased, bilateral Crackles, bilateral occasional expiratory wheezes Abdomen: Bowel Sound present, Soft and non tender Skin: no Rash Extremities: Bilateral Pedal edema, no calf tenderness Neurologic: Grossly no focal neuro deficit.  Labs on Admission:  CBC:  Recent Labs Lab 04/01/15 1828 04/02/15 0448  WBC 14.4* 9.3  NEUTROABS 11.7*  --   HGB 12.6* 11.1*  HCT 37.2* 32.5*  MCV 84.9 83.8  PLT 224 212    CMP     Component Value Date/Time   NA 135 04/02/2015 0448   K 3.3* 04/02/2015 0448   CL 102 04/02/2015 0448   CO2 27 04/02/2015 0448   GLUCOSE 290* 04/02/2015 0448   BUN 6 04/02/2015 0448   CREATININE 0.75 04/02/2015 0448   CALCIUM 8.2* 04/02/2015 0448   PROT 6.3 04/02/2015 0448   ALBUMIN 3.1* 04/02/2015 0448   AST 12 04/02/2015 0448   ALT 7 04/02/2015 0448   ALKPHOS 70 04/02/2015 0448   BILITOT 0.4 04/02/2015 0448   GFRNONAA >90 04/02/2015 0448   GFRAA >90 04/02/2015 0448    No results for input(s): LIPASE, AMYLASE in the last 168 hours.  No results for input(s): CKTOTAL, CKMB, CKMBINDEX, TROPONINI in the last 168 hours. BNP (last 3 results)  Recent Labs  04/01/15 1828  BNP 58.9    ProBNP (last 3 results) No results for input(s): PROBNP in the last 8760 hours.   Radiological Exams on Admission: Dg Chest 2 View  04/01/2015   CLINICAL DATA:  Cough and fever for 4 days  EXAM: CHEST  2 VIEW  COMPARISON:  04/29/2014  FINDINGS: The heart size and mediastinal contours are within normal limits. Both lungs are clear. The visualized skeletal structures are unremarkable.  IMPRESSION: No active cardiopulmonary disease.   Electronically Signed   By: Andreas Newport M.D.   On: 04/01/2015 21:27   EKG: Independently reviewed. sinus  tachycardia.  Assessment/Plan Principal Problem:   Bronchiolitis Active Problems:   Type 2 diabetes mellitus   Obesity   HYPERTENSION, BENIGN ESSENTIAL   SYSTOLIC HEART FAILURE, CHRONIC   1. Bronchiolitis The patient is presenting with numbness of cough and shortness of breath. Chest x-ray here does not show any evidence of pneumonia. Patient on examination has diffuse crackles as well as diffuse occasional wheezing. He appears in respiratory distress and appears tachycardic and tachypneic. He is volume responsive and initially presented with tachycardia which is improved with IV fluid boluses. With this picture, multiple ER visits and saturations dropping down to high 80s and low 90s patient will be admitted  in the hospital for observation. My suspicion for infection is less likely therefore I'm holding off on antibiotics. I'm following influenza PCR and sputum culture. I will be placing the patient on duo nebs. Also Mucinex and flutter device for sputum clearance. Tessalon Perles for cough suppression.  2. Chronic systolic heart failure. The patient has diffuse crackles. BNP does not elevated and chest x-ray does not show congestion. Currently I would continue his home Lasix and continue Coreg. Continue Aldactone. Continue lisinopril.  3. Type 2 diabetes mellitus. Holding metformin and glipizide and placing the patient on sliding scale.  DVT Prophylaxis: subcutaneous Heparin Nutrition: Regular diet  Disposition: Admitted to observation in telemetry unit.  Author: Berle Mull, MD [facility] Pager: [phone] 04/02/2015,     If 7PM-7AM, please contact night-coverage [email or web address] Password [email or web address]

## 2015-04-02 NOTE — Clinical Social Work Note (Signed)
CSW received consult for medication assistance. Consult not appropriate for CSW services. Please consult RNCM for assistance. No further needs. CSW signing off.   [place], [place] Weekend Clinical Social Worker [phone]

## 2015-04-03 DIAGNOSIS — I5022 Chronic systolic (congestive) heart failure: Secondary | ICD-10-CM | POA: Diagnosis not present

## 2015-04-03 DIAGNOSIS — I10 Essential (primary) hypertension: Secondary | ICD-10-CM | POA: Diagnosis not present

## 2015-04-03 DIAGNOSIS — R509 Fever, unspecified: Secondary | ICD-10-CM | POA: Diagnosis not present

## 2015-04-03 DIAGNOSIS — E1165 Type 2 diabetes mellitus with hyperglycemia: Secondary | ICD-10-CM

## 2015-04-03 DIAGNOSIS — J219 Acute bronchiolitis, unspecified: Secondary | ICD-10-CM | POA: Diagnosis not present

## 2015-04-03 LAB — URINE CULTURE
CULTURE: NO GROWTH
Colony Count: NO GROWTH

## 2015-04-03 LAB — BASIC METABOLIC PANEL
Anion gap: 10 (ref 5–15)
BUN: 10 mg/dL (ref 6–23)
CO2: 27 mmol/L (ref 19–32)
CREATININE: 0.84 mg/dL (ref 0.50–1.35)
Calcium: 9.1 mg/dL (ref 8.4–10.5)
Chloride: 100 mmol/L (ref 96–112)
Glucose, Bld: 194 mg/dL — ABNORMAL HIGH (ref 70–99)
Potassium: 4.3 mmol/L (ref 3.5–5.1)
Sodium: 137 mmol/L (ref 135–145)

## 2015-04-03 LAB — CBC
HCT: 37.3 % — ABNORMAL LOW (ref 39.0–52.0)
Hemoglobin: 12.4 g/dL — ABNORMAL LOW (ref 13.0–17.0)
MCH: 28.2 pg (ref 26.0–34.0)
MCHC: 33.2 g/dL (ref 30.0–36.0)
MCV: 85 fL (ref 78.0–100.0)
Platelets: 234 10*3/uL (ref 150–400)
RBC: 4.39 MIL/uL (ref 4.22–5.81)
RDW: 12.8 % (ref 11.5–15.5)
WBC: 7.7 10*3/uL (ref 4.0–10.5)

## 2015-04-03 LAB — GLUCOSE, CAPILLARY
GLUCOSE-CAPILLARY: 355 mg/dL — AB (ref 70–99)
Glucose-Capillary: 199 mg/dL — ABNORMAL HIGH (ref 70–99)
Glucose-Capillary: 293 mg/dL — ABNORMAL HIGH (ref 70–99)
Glucose-Capillary: 278 mg/dL — ABNORMAL HIGH (ref 70–99)

## 2015-04-03 LAB — MAGNESIUM: MAGNESIUM: 2 mg/dL (ref 1.5–2.5)

## 2015-04-03 NOTE — Progress Notes (Signed)
PROGRESS NOTE  Jared Hart VZD:[id number] DOB: December 30, 1966 DOA: 04/01/2015 PCP: Glori Bickers, MD   Brief history 49 year old male with a history of hypertensive cardiomyopathy, systolic CHF, hypertension, diabetes mellitus presented with 2 days of fever and one week of coughing. The patient went to [facility] emergency department approximately 5 days prior to this admission. The patient was placed on levofloxacin for 5 days which he took. His coughing did not improve. He coughed the point of having posttussive emesis. He went back to the emergency department at [facility] on 03/31/2015, and was sent home with antitussive medication. On 04/01/2015, the patient presented to the emergency department at [facility]. The patient was started to be febrile with temperature 100.14F and tachycardic with a heart rate up to 130s.. The patient was given 2 L of NS with improvement of his heart rate. Oxygen saturation was 91-93 percent on room air. He was placed on supplemental oxygen. WBC was noted to be 14.4. Lactic acid was 0.7. Presently, the patient denies any chest pain, shortness breath, vomiting, diarrhea, abdominal pain, dysuria, hematuria, hematochezia, melena. He has a mild frontal headache without any neck pain. There is no rashes or synovitis. Assessment/Plan: Fever/leukocytosis/bronchiolitis -Chest x-ray negative for consolidation -As the patient is hemodynamically stable and improved with fluids, hold off on antibiotics -Follow blood cultures--neg to date -Influenza PCR negative -Respiratory viral panel--pending -Urinalysis is negative for pyuria -now stable on RA -sputum cultures neg to date Chronic systolic CHF/hypertensive cardiopathy -Appears compensated -Continue furosemide, carvedilol, Aldactone, lisinopril -Continue aspirin 81 mg daily -01/24/2014 echo EF 45-50 percent, diffuse HK Diabetes mellitus type 2 -Check hemoglobin A1c -Hold metformin and glipizide -NovoLog sliding  scale Hypertension -Continue carvedilol, lisinopril, Aldactone Hypokalemia -Replete -Check magnesium--2.0   Family Communication: Pt at beside Disposition Plan: Home when medically stable  Procedures/Studies: Dg Chest 2 View  04/01/2015   CLINICAL DATA:  Cough and fever for 4 days  EXAM: CHEST  2 VIEW  COMPARISON:  04/29/2014  FINDINGS: The heart size and mediastinal contours are within normal limits. Both lungs are clear. The visualized skeletal structures are unremarkable.  IMPRESSION: No active cardiopulmonary disease.   Electronically Signed   By: Andreas Newport M.D.   On: 04/01/2015 21:27         Subjective: Patient denies fevers, chills, headache, chest pain, dyspnea, nausea, vomiting, diarrhea, abdominal pain, dysuria, hematuria. He states that his cough is a little bit better. He denies any hemoptysis.   Objective: Filed Vitals:   04/02/15 2132 04/03/15 0618 04/03/15 0850 04/03/15 1446  BP: 162/91 141/89  149/75  Pulse: 79 79  76  Temp: 98 F (36.7 C) 98 F (36.7 C)  97.5 F (36.4 C)  TempSrc: Oral Oral  Oral  Resp: 18 17  18   Height:      Weight:  102.876 kg (226 lb 12.8 oz)    SpO2: 98% 98% 97% 96%    Intake/Output Summary (Last 24 hours) at 04/03/15 1818 Last data filed at 04/03/15 1400  Gross per 24 hour  Intake   1200 ml  Output      0 ml  Net   1200 ml   Weight change: -0.136 kg (-4.8 oz) Exam:   General:  Pt is alert, follows commands appropriately, not in acute distress  HEENT: No icterus, No thrush, [place]/AT  Cardiovascular: RRR, S1/S2, no rubs, no gallops  Respiratory: Left basilar crackles>R. No wheezing. Good air movement.  Abdomen: Soft/+BS, non  tender, non distended, no guarding  Extremities: No edema, No lymphangitis, No petechiae, No rashes, no synovitis  Data Reviewed: Basic Metabolic Panel:  Recent Labs Lab 04/01/15 1828 04/02/15 0448 04/03/15 0434  NA 135 135 137  K 4.0 3.3* 4.3  CL 99 102 100  CO2 24 27 27     GLUCOSE 309* 290* 194*  BUN 11 6 10   CREATININE 0.91 0.75 0.84  CALCIUM 9.4 8.2* 9.1  MG  --   --  2.0   Liver Function Tests:  Recent Labs Lab 04/01/15 1828 04/02/15 0448  AST 15 12  ALT 7 7  ALKPHOS 82 70  BILITOT 0.7 0.4  PROT 7.4 6.3  ALBUMIN 3.7 3.1*   No results for input(s): LIPASE, AMYLASE in the last 168 hours. No results for input(s): AMMONIA in the last 168 hours. CBC:  Recent Labs Lab 04/01/15 1828 04/02/15 0448 04/03/15 0434  WBC 14.4* 9.3 7.7  NEUTROABS 11.7*  --   --   HGB 12.6* 11.1* 12.4*  HCT 37.2* 32.5* 37.3*  MCV 84.9 83.8 85.0  PLT 224 212 234   Cardiac Enzymes: No results for input(s): CKTOTAL, CKMB, CKMBINDEX, TROPONINI in the last 168 hours. BNP: Invalid input(s): POCBNP CBG:  Recent Labs Lab 04/02/15 1701 04/02/15 2131 04/03/15 0805 04/03/15 1207 04/03/15 1656  GLUCAP 257* 235* 199* 293* 278*    Recent Results (from the past 240 hour(s))  Culture, blood (routine x 2)     Status: None (Preliminary result)   Collection Time: 04/01/15  6:28 PM  Result Value Ref Range Status   Specimen Description BLOOD LEFT ARM  Final   Special Requests BOTTLES DRAWN AEROBIC ONLY 4 CC  Final   Culture   Final           BLOOD CULTURE RECEIVED NO GROWTH TO DATE CULTURE WILL BE HELD FOR 5 DAYS BEFORE ISSUING A FINAL NEGATIVE REPORT Performed at [facility]    Report Status PENDING  Incomplete  Culture, blood (routine x 2)     Status: None (Preliminary result)   Collection Time: 04/01/15  7:33 PM  Result Value Ref Range Status   Specimen Description BLOOD RIGHT FOREARM  Final   Special Requests BOTTLES DRAWN AEROBIC AND ANAEROBIC 10CC EA  Final   Culture   Final           BLOOD CULTURE RECEIVED NO GROWTH TO DATE CULTURE WILL BE HELD FOR 5 DAYS BEFORE ISSUING A FINAL NEGATIVE REPORT Performed at [facility]    Report Status PENDING  Incomplete  Urine culture     Status: None   Collection Time: 04/01/15  8:38 PM  Result  Value Ref Range Status   Specimen Description URINE, CLEAN CATCH  Final   Special Requests NONE  Final   Colony Count NO GROWTH Performed at [facility]   Final   Culture NO GROWTH Performed at [facility]   Final   Report Status 04/03/2015 FINAL  Final  Culture, sputum-assessment     Status: None   Collection Time: 04/02/15  9:03 AM  Result Value Ref Range Status   Specimen Description SPUTUM  Final   Special Requests NONE  Final   Sputum evaluation   Final    THIS SPECIMEN IS ACCEPTABLE. RESPIRATORY CULTURE REPORT TO FOLLOW.   Report Status 04/02/2015 FINAL  Final  Culture, respiratory (NON-Expectorated)     Status: None (Preliminary result)   Collection Time: 04/02/15  9:03 AM  Result  Value Ref Range Status   Specimen Description SPUTUM  Final   Special Requests NONE  Final   Gram Stain   Final    FEW WBC PRESENT,BOTH PMN AND MONONUCLEAR NO SQUAMOUS EPITHELIAL CELLS SEEN NO ORGANISMS SEEN Performed at [facility]    Culture   Final    Culture reincubated for better growth Performed at [facility]    Report Status PENDING  Incomplete     Scheduled Meds: . aspirin  81 mg Oral Daily  . benzonatate  100 mg Oral TID  . carvedilol  25 mg Oral BID WC  . furosemide  40 mg Oral Daily  . guaiFENesin  600 mg Oral BID  . heparin  5,000 Units Subcutaneous 3 times per day  . insulin aspart  0-15 Units Subcutaneous TID WC  . insulin aspart  0-5 Units Subcutaneous QHS  . ipratropium-albuterol  3 mL Nebulization BID  . lisinopril  20 mg Oral BID  . spironolactone  25 mg Oral Daily   Continuous Infusions:    Brysun Eschmann, [place], DO  [facility] Pager [phone]  If 7PM-7AM, please contact night-coverage [email or web address] Password [email or web address] 04/03/2015, 6:18 PM

## 2015-04-03 NOTE — Progress Notes (Signed)
UR completed 

## 2015-04-03 NOTE — Progress Notes (Signed)
Inpatient Diabetes Program Recommendations  AACE/ADA: New Consensus Statement on Inpatient Glycemic Control (2013)  Target Ranges:  Prepandial:   less than 140 mg/dL      Peak postprandial:   less than 180 mg/dL (1-2 hours)      Critically ill patients:  140 - 180 mg/dL   Reason for Assessment:  Results for KRIST, ROSENBOOM (MRN [id number]) as of 04/03/2015 13:23  Ref. Range 04/02/2015 11:59 04/02/2015 17:01 04/02/2015 21:31 04/03/2015 08:05 04/03/2015 12:07  Glucose-Capillary Latest Range: 70-99 mg/dL 259 (H) 257 (H) 235 (H) 199 (H) 293 (H)   Diabetes history: Type 2 diabetes Outpatient Diabetes medications: Glipizide 5 mg bid with meals, Metformin 500 mg bid Current orders for Inpatient glycemic control:  Novolog moderate tid with meals and HS  Please consider adding Levemir 20 units daily while in the hospital.  Thanks, Adah Perl, RN, BC-ADM Inpatient Diabetes Coordinator Pager [phone] (8a-5p)

## 2015-04-04 DIAGNOSIS — I10 Essential (primary) hypertension: Secondary | ICD-10-CM | POA: Diagnosis not present

## 2015-04-04 DIAGNOSIS — J219 Acute bronchiolitis, unspecified: Secondary | ICD-10-CM | POA: Diagnosis not present

## 2015-04-04 DIAGNOSIS — I5022 Chronic systolic (congestive) heart failure: Secondary | ICD-10-CM | POA: Diagnosis not present

## 2015-04-04 LAB — CBC
HCT: 35.2 % — ABNORMAL LOW (ref 39.0–52.0)
Hemoglobin: 12.1 g/dL — ABNORMAL LOW (ref 13.0–17.0)
MCH: 28.7 pg (ref 26.0–34.0)
MCHC: 34.4 g/dL (ref 30.0–36.0)
MCV: 83.4 fL (ref 78.0–100.0)
PLATELETS: 283 10*3/uL (ref 150–400)
RBC: 4.22 MIL/uL (ref 4.22–5.81)
RDW: 12.9 % (ref 11.5–15.5)
WBC: 6.7 10*3/uL (ref 4.0–10.5)

## 2015-04-04 LAB — RESPIRATORY VIRUS PANEL
Adenovirus: NEGATIVE
Influenza A: NEGATIVE
Influenza B: NEGATIVE
Metapneumovirus: NEGATIVE
Parainfluenza 1: NEGATIVE
Parainfluenza 2: NEGATIVE
Parainfluenza 3: NEGATIVE
Respiratory Syncytial Virus A: NEGATIVE
Respiratory Syncytial Virus B: NEGATIVE
Rhinovirus: POSITIVE — AB

## 2015-04-04 LAB — CULTURE, RESPIRATORY W GRAM STAIN: Culture: NORMAL

## 2015-04-04 LAB — GLUCOSE, CAPILLARY: GLUCOSE-CAPILLARY: 242 mg/dL — AB (ref 70–99)

## 2015-04-04 LAB — HEMOGLOBIN A1C
Hgb A1c MFr Bld: 9.8 % — ABNORMAL HIGH (ref 4.8–5.6)
MEAN PLASMA GLUCOSE: 235 mg/dL

## 2015-04-04 MED ORDER — BENZONATATE 100 MG PO CAPS: 100.0000 mg | ORAL_CAPSULE | Freq: Three times a day (TID) | ORAL | Status: DC

## 2015-04-04 MED ORDER — METFORMIN HCL 500 MG PO TABS: 500.0000 mg | ORAL_TABLET | Freq: Two times a day (BID) | ORAL | Status: DC

## 2015-04-04 MED ORDER — GLIPIZIDE 5 MG PO TABS: 5.0000 mg | ORAL_TABLET | Freq: Two times a day (BID) | ORAL | Status: DC

## 2015-04-04 NOTE — Discharge Summary (Signed)
Physician Discharge Summary  Jared Hart ELF:[id number] DOB: 08/26/1966 DOA: 04/01/2015  PCP: Glori Bickers, MD  Admit date: 04/01/2015 Discharge date: 04/04/2015  Recommendations for Outpatient Follow-up:  1. Pt will need to follow up with PCP in 2 weeks post discharge 2. Please obtain BMP in 1 week  Discharge Diagnoses:  Fever/leukocytosis/bronchiolitis -Chest x-ray negative for consolidation -pt did not have any hypoxemia for the rest of the hospitalization and remained stable on RA -As the patient is hemodynamically stable and improved with fluids, hold off on antibiotics -Follow blood cultures--neg to date -pt remained afebrile and hemodynamically stable for nearly 72 hrs prior to d/c -Influenza PCR negative -Respiratory viral panel--pending at the time of d/c -Urinalysis is negative for pyuria Chronic systolic CHF/hypertensive cardiopathy -Appears compensated -Continue furosemide, carvedilol, Aldactone, lisinopril -Continue aspirin 81 mg daily -01/24/2014 echo EF 45-50 percent, diffuse HK Diabetes mellitus type 2 -Check hemoglobin A1c--9.8 -Hold metformin and glipizide during the hospitalization -NovoLog sliding scale -pt informed me he did not have insurance and he had not taken any DM meds for 2 months prior to admission -discussed using 70/30 insulin with the patient  -pt stated that he did not want to start insulin at this time and wanted to restart his prior pill regimen with lifestyle modification before reconsidering insulin -pt d/c with his prior metformin and glipizide regimen and referral given to [facility] Hypertension -Continue carvedilol, lisinopril, Aldactone Hypokalemia -Repleted -Check magnesium--2.0  Discharge Condition: stable  Disposition: home Follow-up Information    Follow up with [place]     In 1 week.   Contact information:   [place]  [id number] [phone]      Diet: Carb Modified Wt Readings from Last 3 Encounters:  04/04/15 101.061 kg (222 lb 12.8 oz)  04/29/14 114.76 kg (253 lb)  10/22/13 110.451 kg (243 lb 8 oz)    History of present illness:  49 year old male with a history of hypertensive cardiomyopathy, systolic CHF, hypertension, diabetes mellitus presented with 2 days of fever and one week of coughing. The patient went to [facility] emergency department approximately 5 days prior to this admission. The patient was placed on levofloxacin for 5 days which he took. His coughing did not improve. He coughed the point of having posttussive emesis. He went back to the emergency department at [facility] on 03/31/2015, and was sent home with antitussive medication. On 04/01/2015, the patient presented to the emergency department at [facility]. The patient was started to be febrile with temperature 100.57F and tachycardic with a heart rate up to 130s.. The patient was given 2 L of NS with improvement of his heart rate. Oxygen saturation was 91-93 percent on room air. He was placed on supplemental oxygen. WBC was noted to be 14.4. Lactic acid was 0.7. Presently, the patient denies any chest pain, shortness breath, vomiting, diarrhea, abdominal pain, dysuria, hematuria, hematochezia, melena. He has a mild frontal headache without any neck pain. There is no rashes or synovitis.    Discharge Exam: Filed Vitals:   04/04/15 0955  BP: 129/66  Pulse: 76  Temp: 97.8 F (36.6 C)  Resp: 16   Filed Vitals:   04/04/15 0514 04/04/15 0516 04/04/15 0800 04/04/15 0955  BP: 141/65  137/85 129/66  Pulse: 80  73 76  Temp: 98.6 F (37 C)  97.9 F (36.6 C) 97.8 F (36.6 C)  TempSrc: Oral  Oral Oral  Resp: 18  18 16  Height:      Weight:  101.061 kg (222 lb 12.8 oz)    SpO2: 95%  95%    General: A&O x 3, NAD, pleasant, cooperative Cardiovascular: RRR, no rub, no gallop, no S3 Respiratory: L-basilar rales, R-CTA Abdomen:soft,  nontender, nondistended, positive bowel sounds Extremities: No edema, No lymphangitis, no petechiae  Discharge Instructions  Discharge Instructions    Diet - low sodium heart healthy    Complete by:  As directed      Increase activity slowly    Complete by:  As directed             Medication List    STOP taking these medications        chlorpheniramine-HYDROcodone 10-8 MG/5ML Lqcr  Commonly known as:  TUSSIONEX PENNKINETIC ER      TAKE these medications        aspirin 81 MG EC tablet  Take 1 tablet (81 mg total) by mouth daily.     benzonatate 100 MG capsule  Commonly known as:  TESSALON  Take 1 capsule (100 mg total) by mouth 3 (three) times daily.     carvedilol 25 MG tablet  Commonly known as:  COREG  Take 1 tablet (25 mg total) by mouth 2 (two) times daily with a meal.     furosemide 40 MG tablet  Commonly known as:  LASIX  Take 1 tablet (40 mg total) by mouth daily.     glipiZIDE 5 MG tablet  Commonly known as:  GLUCOTROL  Take 1 tablet (5 mg total) by mouth 2 (two) times daily before a meal.     lisinopril 20 MG tablet  Commonly known as:  PRINIVIL,ZESTRIL  Take 1 tablet (20 mg total) by mouth 2 (two) times daily.     metFORMIN 500 MG tablet  Commonly known as:  GLUCOPHAGE  Take 1 tablet (500 mg total) by mouth 2 (two) times daily with a meal.     sodium chloride 0.65 % Soln nasal spray  Commonly known as:  OCEAN  Place 1 spray into both nostrils as needed for congestion.     spironolactone 25 MG tablet  Commonly known as:  ALDACTONE  Take 1 tablet (25 mg total) by mouth daily.         The results of significant diagnostics from this hospitalization (including imaging, microbiology, ancillary and laboratory) are listed below for reference.    Significant Diagnostic Studies: Dg Chest 2 View  04/01/2015   CLINICAL DATA:  Cough and fever for 4 days  EXAM: CHEST  2 VIEW  COMPARISON:  04/29/2014  FINDINGS: The heart size and mediastinal contours are  within normal limits. Both lungs are clear. The visualized skeletal structures are unremarkable.  IMPRESSION: No active cardiopulmonary disease.   Electronically Signed   By: Andreas Newport M.D.   On: 04/01/2015 21:27     Microbiology: Recent Results (from the past 240 hour(s))  Culture, blood (routine x 2)     Status: None (Preliminary result)   Collection Time: 04/01/15  6:28 PM  Result Value Ref Range Status   Specimen Description BLOOD LEFT ARM  Final   Special Requests BOTTLES DRAWN AEROBIC ONLY 4 CC  Final   Culture   Final           BLOOD CULTURE RECEIVED NO GROWTH TO DATE CULTURE WILL BE HELD FOR 5 DAYS BEFORE ISSUING A FINAL NEGATIVE REPORT Performed at [facility]    Report Status PENDING  Incomplete  Culture, blood (routine x 2)     Status: None (Preliminary result)   Collection Time: 04/01/15  7:33 PM  Result Value Ref Range Status   Specimen Description BLOOD RIGHT FOREARM  Final   Special Requests BOTTLES DRAWN AEROBIC AND ANAEROBIC 10CC EA  Final   Culture   Final           BLOOD CULTURE RECEIVED NO GROWTH TO DATE CULTURE WILL BE HELD FOR 5 DAYS BEFORE ISSUING A FINAL NEGATIVE REPORT Performed at [facility]    Report Status PENDING  Incomplete  Urine culture     Status: None   Collection Time: 04/01/15  8:38 PM  Result Value Ref Range Status   Specimen Description URINE, CLEAN CATCH  Final   Special Requests NONE  Final   Colony Count NO GROWTH Performed at [facility]   Final   Culture NO GROWTH Performed at [facility]   Final   Report Status 04/03/2015 FINAL  Final  Culture, sputum-assessment     Status: None   Collection Time: 04/02/15  9:03 AM  Result Value Ref Range Status   Specimen Description SPUTUM  Final   Special Requests NONE  Final   Sputum evaluation   Final    THIS SPECIMEN IS ACCEPTABLE. RESPIRATORY CULTURE REPORT TO FOLLOW.   Report Status 04/02/2015 FINAL  Final  Culture, respiratory  (NON-Expectorated)     Status: None   Collection Time: 04/02/15  9:03 AM  Result Value Ref Range Status   Specimen Description SPUTUM  Final   Special Requests NONE  Final   Gram Stain   Final    FEW WBC PRESENT,BOTH PMN AND MONONUCLEAR NO SQUAMOUS EPITHELIAL CELLS SEEN NO ORGANISMS SEEN Performed at [facility]    Culture   Final    NORMAL OROPHARYNGEAL FLORA Performed at [facility]    Report Status 04/04/2015 FINAL  Final     Labs: Basic Metabolic Panel:  Recent Labs Lab 04/01/15 1828 04/02/15 0448 04/03/15 0434  NA 135 135 137  K 4.0 3.3* 4.3  CL 99 102 100  CO2 24 27 27   GLUCOSE 309* 290* 194*  BUN 11 6 10   CREATININE 0.91 0.75 0.84  CALCIUM 9.4 8.2* 9.1  MG  --   --  2.0   Liver Function Tests:  Recent Labs Lab 04/01/15 1828 04/02/15 0448  AST 15 12  ALT 7 7  ALKPHOS 82 70  BILITOT 0.7 0.4  PROT 7.4 6.3  ALBUMIN 3.7 3.1*   No results for input(s): LIPASE, AMYLASE in the last 168 hours. No results for input(s): AMMONIA in the last 168 hours. CBC:  Recent Labs Lab 04/01/15 1828 04/02/15 0448 04/03/15 0434 04/04/15 0535  WBC 14.4* 9.3 7.7 6.7  NEUTROABS 11.7*  --   --   --   HGB 12.6* 11.1* 12.4* 12.1*  HCT 37.2* 32.5* 37.3* 35.2*  MCV 84.9 83.8 85.0 83.4  PLT 224 212 234 283   Cardiac Enzymes: No results for input(s): CKTOTAL, CKMB, CKMBINDEX, TROPONINI in the last 168 hours. BNP: Invalid input(s): POCBNP CBG:  Recent Labs Lab 04/03/15 0805 04/03/15 1207 04/03/15 1656 04/03/15 2155 04/04/15 0742  GLUCAP 199* 293* 278* 355* 242*    Time coordinating discharge:  Greater than 30 minutes  Signed:  Avah Bashor, [place], DO [facility] Pager: [phone] 04/04/2015, 11:41 AM

## 2015-04-04 NOTE — Progress Notes (Signed)
Brief Nutrition Note  Received call from RN around 1315 to complete diet education prior to discharge. Noted consult for diet education entered at 1138 today. Attempted to complete education, however, no one was in room at time of visit. Spoke with charge nurse, who confirmed that pt has already left facility.  Senie Lanese A. Jimmye Norman, RD, LDN, CDE Pager: [phone] After hours Pager: [phone]

## 2015-04-04 NOTE — Progress Notes (Signed)
Discussed discharge summary with patient. Reviewed all medications with patient. Patient received Rx. Patient ready for discharge. Patient was waiting for dietician to come and speak with him but couldn't wait any longer. Since his wife was also ready for discharge he left with her.

## 2015-04-08 LAB — CULTURE, BLOOD (ROUTINE X 2)
CULTURE: NO GROWTH
Culture: NO GROWTH

## 2015-04-14 ENCOUNTER — Ambulatory Visit: Payer: BC Managed Care – PPO | Attending: Internal Medicine | Admitting: Internal Medicine

## 2015-04-14 ENCOUNTER — Encounter: Payer: Self-pay | Admitting: Internal Medicine

## 2015-04-14 VITALS — BP 143/88 | HR 84 | Temp 98.7°F | Resp 20 | Ht 73.0 in | Wt 225.0 lb

## 2015-04-14 DIAGNOSIS — R509 Fever, unspecified: Secondary | ICD-10-CM

## 2015-04-14 DIAGNOSIS — R05 Cough: Secondary | ICD-10-CM

## 2015-04-14 DIAGNOSIS — R0981 Nasal congestion: Secondary | ICD-10-CM

## 2015-04-14 DIAGNOSIS — E669 Obesity, unspecified: Secondary | ICD-10-CM

## 2015-04-14 DIAGNOSIS — E1165 Type 2 diabetes mellitus with hyperglycemia: Secondary | ICD-10-CM

## 2015-04-14 DIAGNOSIS — R059 Cough, unspecified: Secondary | ICD-10-CM

## 2015-04-14 DIAGNOSIS — I10 Essential (primary) hypertension: Secondary | ICD-10-CM

## 2015-04-14 LAB — GLUCOSE, POCT (MANUAL RESULT ENTRY): POC Glucose: 95 mg/dl (ref 70–99)

## 2015-04-14 MED ORDER — BENZONATATE 100 MG PO CAPS: 100.0000 mg | ORAL_CAPSULE | Freq: Three times a day (TID) | ORAL | Status: DC

## 2015-04-14 MED ORDER — FLUTICASONE PROPIONATE 50 MCG/ACT NA SUSP: 2.0000 | Freq: Every day | NASAL | Status: DC

## 2015-04-14 MED ORDER — METFORMIN HCL 500 MG PO TABS: 500.0000 mg | ORAL_TABLET | Freq: Two times a day (BID) | ORAL | Status: DC

## 2015-04-14 MED ORDER — GLIPIZIDE 5 MG PO TABS: 5.0000 mg | ORAL_TABLET | Freq: Two times a day (BID) | ORAL | Status: DC

## 2015-04-14 MED ORDER — LISINOPRIL 20 MG PO TABS: 20.0000 mg | ORAL_TABLET | Freq: Two times a day (BID) | ORAL | Status: DC

## 2015-04-14 NOTE — Progress Notes (Signed)
F/U DM and Bronchitis Stated still with cough

## 2015-04-14 NOTE — Patient Instructions (Signed)
DASH Eating Plan DASH stands for "Dietary Approaches to Stop Hypertension." The DASH eating plan is a healthy eating plan that has been shown to reduce high blood pressure (hypertension). Additional health benefits may include reducing the risk of type 2 diabetes mellitus, heart disease, and stroke. The DASH eating plan may also help with weight loss. WHAT DO I NEED TO KNOW ABOUT THE DASH EATING PLAN? For the DASH eating plan, you will follow these general guidelines:  Choose foods with a percent daily value for sodium of less than 5% (as listed on the food label).  Use salt-free seasonings or herbs instead of table salt or sea salt.  Check with your health care provider or pharmacist before using salt substitutes.  Eat lower-sodium products, often labeled as "lower sodium" or "no salt added."  Eat fresh foods.  Eat more vegetables, fruits, and low-fat dairy products.  Choose whole grains. Look for the word "whole" as the first word in the ingredient list.  Choose fish and skinless chicken or turkey more often than red meat. Limit fish, poultry, and meat to 6 oz (170 g) each day.  Limit sweets, desserts, sugars, and sugary drinks.  Choose heart-healthy fats.  Limit cheese to 1 oz (28 g) per day.  Eat more home-cooked food and less restaurant, buffet, and fast food.  Limit fried foods.  Cook foods using methods other than frying.  Limit canned vegetables. If you do use them, rinse them well to decrease the sodium.  When eating at a restaurant, ask that your food be prepared with less salt, or no salt if possible. WHAT FOODS CAN I EAT? Seek help from a dietitian for individual calorie needs. Grains Whole grain or whole wheat bread. Brown rice. Whole grain or whole wheat pasta. Quinoa, bulgur, and whole grain cereals. Low-sodium cereals. Corn or whole wheat flour tortillas. Whole grain cornbread. Whole grain crackers. Low-sodium crackers. Vegetables Fresh or frozen vegetables  (raw, steamed, roasted, or grilled). Low-sodium or reduced-sodium tomato and vegetable juices. Low-sodium or reduced-sodium tomato sauce and paste. Low-sodium or reduced-sodium canned vegetables.  Fruits All fresh, canned (in natural juice), or frozen fruits. Meat and Other Protein Products Ground beef (85% or leaner), grass-fed beef, or beef trimmed of fat. Skinless chicken or turkey. Ground chicken or turkey. Pork trimmed of fat. All fish and seafood. Eggs. Dried beans, peas, or lentils. Unsalted nuts and seeds. Unsalted canned beans. Dairy Low-fat dairy products, such as skim or 1% milk, 2% or reduced-fat cheeses, low-fat ricotta or cottage cheese, or plain low-fat yogurt. Low-sodium or reduced-sodium cheeses. Fats and Oils Tub margarines without trans fats. Light or reduced-fat mayonnaise and salad dressings (reduced sodium). Avocado. Safflower, olive, or canola oils. Natural peanut or almond butter. Other Unsalted popcorn and pretzels. The items listed above may not be a complete list of recommended foods or beverages. Contact your dietitian for more options. WHAT FOODS ARE NOT RECOMMENDED? Grains White bread. White pasta. White rice. Refined cornbread. Bagels and croissants. Crackers that contain trans fat. Vegetables Creamed or fried vegetables. Vegetables in a cheese sauce. Regular canned vegetables. Regular canned tomato sauce and paste. Regular tomato and vegetable juices. Fruits Dried fruits. Canned fruit in light or heavy syrup. Fruit juice. Meat and Other Protein Products Fatty cuts of meat. Ribs, chicken wings, bacon, sausage, bologna, salami, chitterlings, fatback, hot dogs, bratwurst, and packaged luncheon meats. Salted nuts and seeds. Canned beans with salt. Dairy Whole or 2% milk, cream, half-and-half, and cream cheese. Whole-fat or sweetened yogurt. Full-fat   cheeses or blue cheese. Nondairy creamers and whipped toppings. Processed cheese, cheese spreads, or cheese  curds. Condiments Onion and garlic salt, seasoned salt, table salt, and sea salt. Canned and packaged gravies. Worcestershire sauce. Tartar sauce. Barbecue sauce. Teriyaki sauce. Soy sauce, including reduced sodium. Steak sauce. Fish sauce. Oyster sauce. Cocktail sauce. Horseradish. Ketchup and mustard. Meat flavorings and tenderizers. Bouillon cubes. Hot sauce. Tabasco sauce. Marinades. Taco seasonings. Relishes. Fats and Oils Butter, stick margarine, lard, shortening, ghee, and bacon fat. Coconut, palm kernel, or palm oils. Regular salad dressings. Other Pickles and olives. Salted popcorn and pretzels. The items listed above may not be a complete list of foods and beverages to avoid. Contact your dietitian for more information. WHERE CAN I FIND MORE INFORMATION? National Heart, Lung, and Blood Institute: www.nhlbi.nih.gov/health/health-topics/topics/dash/ Document Released: 12/05/2011 Document Revised: 05/02/2014 Document Reviewed: 10/20/2013 ExitCare Patient Information 2015 [employer]. This information is not intended to replace advice given to you by your health care provider. Make sure you discuss any questions you have with your health care provider. Diabetes Mellitus and Food It is important for you to manage your blood sugar (glucose) level. Your blood glucose level can be greatly affected by what you eat. Eating healthier foods in the appropriate amounts throughout the day at about the same time each day will help you control your blood glucose level. It can also help slow or prevent worsening of your diabetes mellitus. Healthy eating may even help you improve the level of your blood pressure and reach or maintain a healthy weight.  HOW CAN FOOD AFFECT ME? Carbohydrates Carbohydrates affect your blood glucose level more than any other type of food. Your dietitian will help you determine how many carbohydrates to eat at each meal and teach you how to count carbohydrates. Counting  carbohydrates is important to keep your blood glucose at a healthy level, especially if you are using insulin or taking certain medicines for diabetes mellitus. Alcohol Alcohol can cause sudden decreases in blood glucose (hypoglycemia), especially if you use insulin or take certain medicines for diabetes mellitus. Hypoglycemia can be a life-threatening condition. Symptoms of hypoglycemia (sleepiness, dizziness, and disorientation) are similar to symptoms of having too much alcohol.  If your health care provider has given you approval to drink alcohol, do so in moderation and use the following guidelines:  Women should not have more than one drink per day, and men should not have more than two drinks per day. One drink is equal to:  12 oz of beer.  5 oz of wine.  1 oz of hard liquor.  Do not drink on an empty stomach.  Keep yourself hydrated. Have water, diet soda, or unsweetened iced tea.  Regular soda, juice, and other mixers might contain a lot of carbohydrates and should be counted. WHAT FOODS ARE NOT RECOMMENDED? As you make food choices, it is important to remember that all foods are not the same. Some foods have fewer nutrients per serving than other foods, even though they might have the same number of calories or carbohydrates. It is difficult to get your body what it needs when you eat foods with fewer nutrients. Examples of foods that you should avoid that are high in calories and carbohydrates but low in nutrients include:  Trans fats (most processed foods list trans fats on the Nutrition Facts label).  Regular soda.  Juice.  Candy.  Sweets, such as cake, pie, doughnuts, and cookies.  Fried foods. WHAT FOODS CAN I EAT? Have nutrient-rich foods,   which will nourish your body and keep you healthy. The food you should eat also will depend on several factors, including:  The calories you need.  The medicines you take.  Your weight.  Your blood glucose level.  Your  blood pressure level.  Your cholesterol level. You also should eat a variety of foods, including:  Protein, such as meat, poultry, fish, tofu, nuts, and seeds (lean animal proteins are best).  Fruits.  Vegetables.  Dairy products, such as milk, cheese, and yogurt (low fat is best).  Breads, grains, pasta, cereal, rice, and beans.  Fats such as olive oil, trans fat-free margarine, canola oil, avocado, and olives. DOES EVERYONE WITH DIABETES MELLITUS HAVE THE SAME MEAL PLAN? Because every person with diabetes mellitus is different, there is not one meal plan that works for everyone. It is very important that you meet with a dietitian who will help you create a meal plan that is just right for you. Document Released: 09/12/2005 Document Revised: 12/21/2013 Document Reviewed: 11/12/2013 ExitCare Patient Information 2015 [employer]. This information is not intended to replace advice given to you by your health care provider. Make sure you discuss any questions you have with your health care provider.  

## 2015-04-14 NOTE — Progress Notes (Signed)
Patient Demographics  Jared Hart, is a 49 y.o. male  EXH:[id number]  FYB:[id number]  DOB - Jun 04, 1966  CC: No chief complaint on file.      HPI: Jared Hart is a 49 y.o. male here today to establish medical care. Patient has history of hypertensive cardiomyopathy, systolic CHF, diabetes, recently hospitalized with symptoms of coughing, EMR reviewed, patient was febrile, tachycardic was given IV fluids, initially his oxygen saturation was low as well as he had leukocytosis, his chest x-ray was negative for consolidation, influenza PCR negative, but came back positive for rhino virus ,  patient symptomatically improved after IV fluids, antibiotics were held , for diabetes he was resumed back on his home medications at the time of discharge as per patient he is taking currently his diabetes medication and his fasting sugar is usually between 90-  130 mg/dL, he still has occasional cough denies any fever chills has some nasal congestion and postnasal drip.Patient has No headache, No chest pain, No abdominal pain - No Nausea, No new weakness tingling or numbness, No Cough - SOB.  No Known Allergies Past Medical History  Diagnosis Date  . Diabetes mellitus   . CHF (congestive heart failure)   . Colon cancer   . Hypercholesteremia   . HTN (hypertension)    Current Outpatient Prescriptions on File Prior to Visit  Medication Sig Dispense Refill  . aspirin 81 MG EC tablet Take 1 tablet (81 mg total) by mouth daily. 30 tablet 6  . carvedilol (COREG) 25 MG tablet Take 1 tablet (25 mg total) by mouth 2 (two) times daily with a meal. 60 tablet 6  . furosemide (LASIX) 40 MG tablet Take 1 tablet (40 mg total) by mouth daily. 30 tablet 6  . sodium chloride (OCEAN) 0.65 % SOLN nasal spray Place 1 spray into both nostrils as needed for congestion.    Marland Kitchen spironolactone (ALDACTONE) 25 MG tablet Take 1 tablet (25 mg total) by mouth daily. 30 tablet 6   No current facility-administered medications on  file prior to visit.   Family History  Problem Relation Age of Onset  . Diabetes    . Cancer    . Hypertension    . Hypertension Mother   . Diabetes Mother   . Cancer Mother   . Hypertension Father    History   Social History  . Marital Status: Married    Spouse Name: N/A  . Number of Children: N/A  . Years of Education: N/A   Occupational History  . Not on file.   Social History Main Topics  . Smoking status: Never Smoker   . Smokeless tobacco: Not on file  . Alcohol Use: No  . Drug Use: No  . Sexual Activity: Not on file   Other Topics Concern  . Not on file   Social History Narrative    Review of Systems: Constitutional: Negative for fever, chills, diaphoresis, activity change, appetite change and fatigue. HENT: Negative for ear pain, nosebleeds, congestion, facial swelling, rhinorrhea, neck pain, neck stiffness and ear discharge.  Eyes: Negative for pain, discharge, redness, itching and visual disturbance. Respiratory: Negative for cough, choking, chest tightness, shortness of breath, wheezing and stridor.  Cardiovascular: Negative for chest pain, palpitations and leg swelling. Gastrointestinal: Negative for abdominal distention. Genitourinary: Negative for dysuria, urgency, frequency, hematuria, flank pain, decreased urine volume, difficulty urinating and dyspareunia.  Musculoskeletal: Negative for back pain, joint swelling, arthralgia and gait problem. Neurological: Negative for dizziness, tremors, seizures, syncope, facial  asymmetry, speech difficulty, weakness, light-headedness, numbness and headaches.  Hematological: Negative for adenopathy. Does not bruise/bleed easily. Psychiatric/Behavioral: Negative for hallucinations, behavioral problems, confusion, dysphoric mood, decreased concentration and agitation.    Objective:   Filed Vitals:   04/14/15 1414  BP: 143/88  Pulse: 84  Temp: 98.7 F (37.1 C)  Resp: 20    Physical Exam: Constitutional:  Patient appears well-developed and well-nourished. No distress. HENT: Normocephalic, atraumatic, External right and left ear normal. Oropharynx is clear and moist.  Eyes: Conjunctivae and EOM are normal. PERRLA, no scleral icterus. Neck: Normal ROM. Neck supple. No JVD. No tracheal deviation. No thyromegaly. CVS: RRR, S1/S2 +, no murmurs, no gallops, no carotid bruit.  Pulmonary: Effort and breath sounds normal, no stridor, rhonchi, wheezes, rales.  Abdominal: Soft. BS +, no distension, tenderness, rebound or guarding.  Musculoskeletal: Normal range of motion. No edema and no tenderness.  Neuro: Alert. Normal reflexes, muscle tone coordination. No cranial nerve deficit. Skin: Skin is warm and dry. No rash noted. Not diaphoretic. No erythema. No pallor. Psychiatric: Normal mood and affect. Behavior, judgment, thought content normal.  Lab Results  Component Value Date   WBC 6.7 04/04/2015   HGB 12.1* 04/04/2015   HCT 35.2* 04/04/2015   MCV 83.4 04/04/2015   PLT 283 04/04/2015   Lab Results  Component Value Date   CREATININE 0.84 04/03/2015   BUN 10 04/03/2015   NA 137 04/03/2015   K 4.3 04/03/2015   CL 100 04/03/2015   CO2 27 04/03/2015    Lab Results  Component Value Date/Time   HGBA1C 9.8* 04/03/2015 04:34 AM   Lipid Panel     Component Value Date/Time   CHOL 185 11/01/2013 0936   TRIG 447.0* 11/01/2013 0936   HDL 34.40* 11/01/2013 0936   CHOLHDL 5 11/01/2013 0936   VLDL 89.4* 11/01/2013 0936   LDLCALC 85 09/20/2012 0525       Assessment and plan:   1. Type 2 diabetes mellitus with hyperglycemia  recent hemoglobin A1c was 9.8%,patient will modify his diet, does not want to be on insulin, continue with current meds, repeat A1c in 3 months. - Glucose (CBG) - metFORMIN (GLUCOPHAGE) 500 MG tablet; Take 1 tablet (500 mg total) by mouth 2 (two) times daily with a meal.  Dispense: 60 tablet; Refill: 3 - glipiZIDE (GLUCOTROL) 5 MG tablet; Take 1 tablet (5 mg total) by  mouth 2 (two) times daily before a meal.  Dispense: 60 tablet; Refill: 3  2. HYPERTENSION, BENIGN ESSENTIAL/history of CHF  patient is given refill on his blood pressure medication, also following up with cardiology. - lisinopril (PRINIVIL,ZESTRIL) 20 MG tablet; Take 1 tablet (20 mg total) by mouth 2 (two) times daily.  Dispense: 60 tablet; Refill: 6  3. Febrile illness, acute  Likely viral.patient is currently afebrile.  4. Obesity Diet and exercise.   5. Cough  - benzonatate (TESSALON) 100 MG capsule; Take 1 capsule (100 mg total) by mouth 3 (three) times daily.  Dispense: 30 capsule; Refill: 1  6. Nasal congestion  - fluticasone (FLONASE) 50 MCG/ACT nasal spray; Place 2 sprays into both nostrils daily.  Dispense: 16 g; Refill: 3      Health Maintenance -Colonoscopy:patient has history of colon cancer and is following up with the GI  Return in about 3 months (around 07/14/2015), or if symptoms worsen or fail to improve, for diabetes, hypertension.    The patient was given clear instructions to go to ER or return to medical center if  symptoms don't improve, worsen or new problems develop. The patient verbalized understanding. The patient was told to call to get lab results if they haven't heard anything in the next week.    This note has been created with Surveyor, quantity. Any transcriptional errors are unintentional.   Lorayne Marek, MD

## 2015-05-08 ENCOUNTER — Other Ambulatory Visit: Payer: Self-pay

## 2015-05-08 DIAGNOSIS — I509 Heart failure, unspecified: Secondary | ICD-10-CM

## 2015-05-08 MED ORDER — FUROSEMIDE 40 MG PO TABS: 40.0000 mg | ORAL_TABLET | Freq: Every day | ORAL | Status: DC

## 2015-06-10 ENCOUNTER — Emergency Department
Admission: EM | Admit: 2015-06-10 | Discharge: 2015-06-10 | Disposition: A | Discharge status: Home / Self Care | Payer: Self-pay | Attending: Emergency Medicine | Admitting: Emergency Medicine

## 2015-06-10 ENCOUNTER — Encounter: Payer: Self-pay | Admitting: Emergency Medicine

## 2015-06-10 DIAGNOSIS — X58XXXD Exposure to other specified factors, subsequent encounter: Secondary | ICD-10-CM | POA: Insufficient documentation

## 2015-06-10 DIAGNOSIS — Z7982 Long term (current) use of aspirin: Secondary | ICD-10-CM | POA: Insufficient documentation

## 2015-06-10 DIAGNOSIS — Y990 Civilian activity done for income or pay: Secondary | ICD-10-CM | POA: Insufficient documentation

## 2015-06-10 DIAGNOSIS — Z79899 Other long term (current) drug therapy: Secondary | ICD-10-CM | POA: Insufficient documentation

## 2015-06-10 DIAGNOSIS — E119 Type 2 diabetes mellitus without complications: Secondary | ICD-10-CM | POA: Insufficient documentation

## 2015-06-10 DIAGNOSIS — S0502XD Injury of conjunctiva and corneal abrasion without foreign body, left eye, subsequent encounter: Secondary | ICD-10-CM | POA: Insufficient documentation

## 2015-06-10 DIAGNOSIS — I10 Essential (primary) hypertension: Secondary | ICD-10-CM | POA: Insufficient documentation

## 2015-06-10 DIAGNOSIS — X58XXXA Exposure to other specified factors, initial encounter: Secondary | ICD-10-CM | POA: Insufficient documentation

## 2015-06-10 DIAGNOSIS — T1592XA Foreign body on external eye, part unspecified, left eye, initial encounter: Secondary | ICD-10-CM

## 2015-06-10 DIAGNOSIS — Z7951 Long term (current) use of inhaled steroids: Secondary | ICD-10-CM | POA: Insufficient documentation

## 2015-06-10 DIAGNOSIS — T1512XA Foreign body in conjunctival sac, left eye, initial encounter: Secondary | ICD-10-CM | POA: Insufficient documentation

## 2015-06-10 DIAGNOSIS — Y9289 Other specified places as the place of occurrence of the external cause: Secondary | ICD-10-CM | POA: Insufficient documentation

## 2015-06-10 DIAGNOSIS — Y9389 Activity, other specified: Secondary | ICD-10-CM | POA: Insufficient documentation

## 2015-06-10 MED ORDER — TETRACAINE HCL 0.5 % OP SOLN
OPHTHALMIC | Status: AC
  Filled 2015-06-10: qty 2

## 2015-06-10 MED ORDER — FLUORESCEIN SODIUM 1 MG OP STRP
ORAL_STRIP | OPHTHALMIC | Status: AC
  Administered 2015-06-10: 12:00:00
  Filled 2015-06-10: qty 1

## 2015-06-10 MED ORDER — HYDROCODONE-ACETAMINOPHEN 5-325 MG PO TABS: 1.0000 | ORAL_TABLET | ORAL | Status: DC | PRN

## 2015-06-10 MED ORDER — TOBRAMYCIN 0.3 % OP SOLN: 1.0000 [drp] | OPHTHALMIC | Status: AC

## 2015-06-10 MED ORDER — EYE WASH OPHTH SOLN
OPHTHALMIC | Status: AC
  Filled 2015-06-10: qty 118

## 2015-06-10 MED ORDER — TETRACAINE HCL 0.5 % OP SOLN
OPHTHALMIC | Status: AC
  Administered 2015-06-10: 12:00:00
  Filled 2015-06-10: qty 2

## 2015-06-10 MED ORDER — FLUORESCEIN SODIUM 1 MG OP STRP
ORAL_STRIP | OPHTHALMIC | Status: AC
  Filled 2015-06-10: qty 1

## 2015-06-10 NOTE — Discharge Instructions (Signed)
It is very important you call the eye doctor Monday and get a follow up appointment next week. Use eye drops for 1 week. Take hydrocodone as needed for pain - do not drive or work after taking Eye, Foreign Body A foreign body is an object that should not be there. The object could be near, on, or in the eye. HOME CARE If your doctor prescribes an eye patch:  Keep the eye patch on. Do this until you see your doctor again.  Do not remove the patch to put in medicine unless your doctor tells you.  Retape it as it was before:  When replacing the patch.  If the patch comes loose.  Do not drive or use machinery.  Only take medicine as told by your doctor. If your doctor does not prescribe an eye patch:  Keep the eye closed as much as possible.  Do not rub the eye.  Wear dark glasses in bright light.  Do not wear contact lenses until the eye feels normal, or as told by your doctor.  Wear protective eye covering, especially when using high speed tools.  Only take medicine as told by your doctor. GET HELP RIGHT AWAY IF:   Your pain gets worse.  Your vision changes.  You have problems with the eye patch.  The injury gets larger.  There is fluid (discharge) coming from the eye.  You get puffiness (swelling) and soreness.  You have an oral temperature above 102 F (38.9 C), not controlled by medicine.  Your baby is older than 3 months with a rectal temperature of 102 F (38.9 C) or higher.  Your baby is 41 months old or younger with a rectal temperature of 100.4 F (38 C) or higher. MAKE SURE YOU:   Understand these instructions.  Will watch your condition.  Will get help right away if you are not doing well or get worse. Document Released: 06/05/2010 Document Revised: 03/09/2012 Document Reviewed: 05/13/2013 [facility] Patient Information 2015 [place], [place]. This information is not intended to replace advice given to you by your health care provider. Make sure you  discuss any questions you have with your health care provider.

## 2015-06-10 NOTE — ED Provider Notes (Signed)
CSN: [id number]     Arrival date & time 06/10/15  1046 History   First MD Initiated Contact with Patient 06/10/15 1107     Chief Complaint  Patient presents with  . Conjunctivitis      HPI Comments: 49 year old male presents today complaining of left eye discomfort for the past 3 days. 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 He does not remember a specific incident this week that made him feel like there was a foreign body in his eye. He has had metal foreign body in the past that was removed. He does not wear contact lenses. He has had some matting and swelling of his eye. He is also extends photophobia. No nausea or vomiting.  Patient is a 49 y.o. male presenting with eye problem. The history is provided by the patient.  Eye Problem Location:  L eye Quality:  Aching, foreign body sensation and tearing Severity:  Moderate Onset quality:  Gradual Duration:  3 days Timing:  Constant Progression:  Unchanged Chronicity:  Recurrent Context: foreign body   Context: not contact lens problem, not direct trauma, not scratch and not tanning booth use   Foreign body:  Unknown Relieved by:  Nothing Worsened by:  Bright light Ineffective treatments:  None tried Associated symptoms: discharge, foreign body sensation, inflammation, itching, photophobia, redness and swelling   Associated symptoms: no blurred vision, no crusting, no double vision, no headaches, no nausea and no vomiting   Risk factors: previous injury to eye     Past Medical History  Diagnosis Date  . Diabetes mellitus   . CHF (congestive heart failure)   . Colon cancer   . Hypercholesteremia   . HTN (hypertension)    Past Surgical History  Procedure Laterality Date  . Cholecystectomy    . Appendectomy    . Colon surgery     Family History  Problem Relation Age of Onset  . Diabetes    . Cancer    . Hypertension    . Hypertension Mother   . Diabetes Mother   . Cancer  Mother   . Hypertension Father    History  Substance Use Topics  . Smoking status: Never Smoker   . Smokeless tobacco: Not on file  . Alcohol Use: No    Review of Systems  Constitutional: Negative for fever and chills.  Eyes: Positive for photophobia, pain, discharge, redness and itching. Negative for blurred vision and double vision.  Gastrointestinal: Negative for nausea and vomiting.  Neurological: Negative for headaches.  All other systems reviewed and are negative.     Allergies  Review of patient's allergies indicates no known allergies.  Home Medications   Prior to Admission medications   Medication Sig Start Date End Date Taking? Authorizing Provider  aspirin 81 MG EC tablet Take 1 tablet (81 mg total) by mouth daily. 06/10/14   Jolaine Artist, MD  benzonatate (TESSALON) 100 MG capsule Take 1 capsule (100 mg total) by mouth 3 (three) times daily. 04/14/15   Lorayne Marek, MD  carvedilol (COREG) 25 MG tablet Take 1 tablet (25 mg total) by mouth 2 (two) times daily with a meal. 06/10/14   Jolaine Artist, MD  fluticasone (FLONASE) 50 MCG/ACT nasal spray Place 2 sprays into both nostrils daily. 04/14/15   Lorayne Marek, MD  furosemide (LASIX) 40 MG tablet Take 1 tablet (40 mg total) by mouth daily. 05/08/15   Deepak Advani,  MD  glipiZIDE (GLUCOTROL) 5 MG tablet Take 1 tablet (5 mg total) by mouth 2 (two) times daily before a meal. 04/14/15   Lorayne Marek, MD  HYDROcodone-acetaminophen (NORCO/VICODIN) 5-325 MG per tablet Take 1 tablet by mouth every 4 (four) hours as needed for moderate pain. 06/10/15   Corliss Parish, PA-C  lisinopril (PRINIVIL,ZESTRIL) 20 MG tablet Take 1 tablet (20 mg total) by mouth 2 (two) times daily. 04/14/15   Lorayne Marek, MD  metFORMIN (GLUCOPHAGE) 500 MG tablet Take 1 tablet (500 mg total) by mouth 2 (two) times daily with a meal. 04/14/15   Lorayne Marek, MD  sodium chloride (OCEAN) 0.65 % SOLN nasal spray Place 1 spray into both nostrils as needed  for congestion.    Historical Provider, MD  spironolactone (ALDACTONE) 25 MG tablet Take 1 tablet (25 mg total) by mouth daily. 06/10/14   Shaune Pascal Bensimhon, MD  tobramycin (TOBREX) 0.3 % ophthalmic solution Place 1 drop into the left eye every 4 (four) hours. 06/10/15 06/17/15  Shayne Alken V, PA-C   BP 168/101 mmHg  Pulse 77  Temp(Src) 98.3 F (36.8 C) (Oral)  Resp 18  Ht 6\' 1"  (1.854 m)  Wt 240 lb (108.863 kg)  BMI 31.67 kg/m2  SpO2 99% Physical Exam  Constitutional: He is oriented to person, place, and time. Vital signs are normal. He appears well-developed and well-nourished.  Non-toxic appearance. He does not have a sickly appearance. He does not appear ill.  HENT:  Head: Normocephalic and atraumatic.  Right Ear: Tympanic membrane and external ear normal.  Left Ear: Tympanic membrane and external ear normal.  Nose: Nose normal.  Mouth/Throat: Oropharynx is clear and moist.  Eyes: EOM and lids are normal. Pupils are equal, round, and reactive to light. Lids are everted and swept, no foreign bodies found. Right conjunctiva is injected. Left conjunctiva is injected.  Slit lamp exam:      The left eye shows foreign body and fluorescein uptake.    Left eye with conjunctival foreign body. No rust ring appreciated  Neck: Normal range of motion. Neck supple.  Musculoskeletal: Normal range of motion.  Lymphadenopathy:    He has no cervical adenopathy.  Neurological: He is alert and oriented to person, place, and time.  Skin: Skin is warm and dry.  Psychiatric: He has a normal mood and affect. His behavior is normal. Judgment and thought content normal.  Nursing note and vitals reviewed.  Visual acuity is checked prior to foreign body removal. See documentation in nurse's note. ED Course  FOREIGN BODY REMOVAL Date/Time: 06/10/2015 12:54 PM Performed by: Corliss Parish Authorized by: Shayne Alken V Consent: The procedure was performed in an emergent situation. Verbal consent  obtained. Written consent not obtained. Risks and benefits: risks, benefits and alternatives were discussed Consent given by: patient Patient understanding: patient states understanding of the procedure being performed Patient consent: the patient's understanding of the procedure matches consent given Required items: required blood products, implants, devices, and special equipment available Patient identity confirmed: verbally with patient Body area: eye Location details: left conjunctiva Anesthesia method: Local. Local anesthetic: tetracaine drops Anesthetic total: 4 drops Patient sedated: no Patient restrained: no Patient cooperative: yes Localization method: eyelid eversion, magnification, slit lamp and visualized Removal mechanism: 27-gauge needle and moist cotton swab Eye examined with fluorescein. Fluorescein uptake. No residual rust ring present. Dressing: antibiotic drops Depth: superficial Complexity: simple 2 objects recovered. Objects recovered: 2 small specks of dirt versus metal Post-procedure assessment: foreign body  removed Patient tolerance: Patient tolerated the procedure well with no immediate complications Comments: Initially I used a Q-tip to remove the conjunctival foreign body. It broke into 2 pieces. The second fragment was removed with a 27-gauge needle. I believe these are dirt, but it is possible they are metal.   (including critical care time) Labs Review Labs Reviewed - No data to display  Imaging Review No results found.   EKG Interpretation None      MDM  Foreign body is successfully removed using combination of a Q-tip and needle. Patient's DTaP is up-to-date. I gave him TobraDex eyedrops to use for a week. He was also provided with hydrocodone for pain. Drowsiness warning given. I reinforced he should see an ophthalmologist next week for a recheck. He and his wife was understanding. For any worsening eye pain or discomfort, or vision problems  they should come back to the ER immediately. Final diagnoses:  Foreign body in conjunctival sac, left, initial encounter  Foreign body of left eye, initial encounter        Corliss Parish, PA-C 06/10/15 [place], MD 06/10/15 [phone]

## 2015-06-10 NOTE — ED Notes (Signed)
Pt seen earlier today in ER for metal in left eye, sent home with prescription eye drops and vicodin. Pain getting worse, vision impaired per patient.

## 2015-06-10 NOTE — ED Provider Notes (Signed)
[facility] Emergency Department Provider Note  ____________________________________________  Time seen: Approximately 7:01 PM  I have reviewed the triage vital signs and the nursing notes.   HISTORY  Chief Complaint Eye Pain    HPI Jared Hart is a 49 y.o. male patient seen earlier today complaining of foreign body in left eye which was removed. Patient states that the foreign body sensation he stated pain is not controlled with 5 couldn't. Patient states has increased blurry vision. Patient rated his pain as a 10 over 10.   Past Medical History  Diagnosis Date  . Diabetes mellitus   . CHF (congestive heart failure)   . Colon cancer   . Hypercholesteremia   . HTN (hypertension)     Patient Active Problem List   Diagnosis Date Noted  . Fever 04/02/2015  . Bronchiolitis 04/02/2015  . Type 2 diabetes mellitus with hyperglycemia 04/02/2015  . Hypokalemia 04/02/2015  . Febrile illness, acute   . Fatigue 09/28/2012  . Acute exacerbation of CHF (congestive heart failure) 09/19/2012  . VARICOSE VEINS, LOWER EXTREMITIES 08/24/2010  . Obesity 06/29/2010  . SYSTOLIC HEART FAILURE, CHRONIC 06/01/2010  . COLON CANCER 05/30/2010  . HYPERLIPIDEMIA 05/30/2010  . HYPERTENSION, BENIGN ESSENTIAL 05/30/2010  . CARDIOMYOPATHY 05/13/2010  . CHF 05/13/2010  . Type 2 diabetes mellitus 12/30/1998    Past Surgical History  Procedure Laterality Date  . Cholecystectomy    . Appendectomy    . Colon surgery      Current Outpatient Rx  Name  Route  Sig  Dispense  Refill  . aspirin 81 MG EC tablet   Oral   Take 1 tablet (81 mg total) by mouth daily.   30 tablet   6   . benzonatate (TESSALON) 100 MG capsule   Oral   Take 1 capsule (100 mg total) by mouth 3 (three) times daily.   30 capsule   1   . carvedilol (COREG) 25 MG tablet   Oral   Take 1 tablet (25 mg total) by mouth 2 (two) times daily with a meal.   60 tablet   6   . fluticasone (FLONASE)  50 MCG/ACT nasal spray   Each Nare   Place 2 sprays into both nostrils daily.   16 g   3   . furosemide (LASIX) 40 MG tablet   Oral   Take 1 tablet (40 mg total) by mouth daily.   30 tablet   6   . glipiZIDE (GLUCOTROL) 5 MG tablet   Oral   Take 1 tablet (5 mg total) by mouth 2 (two) times daily before a meal.   60 tablet   3   . HYDROcodone-acetaminophen (NORCO/VICODIN) 5-325 MG per tablet   Oral   Take 1 tablet by mouth every 4 (four) hours as needed for moderate pain.   20 tablet   0   . lisinopril (PRINIVIL,ZESTRIL) 20 MG tablet   Oral   Take 1 tablet (20 mg total) by mouth 2 (two) times daily.   60 tablet   6   . metFORMIN (GLUCOPHAGE) 500 MG tablet   Oral   Take 1 tablet (500 mg total) by mouth 2 (two) times daily with a meal.   60 tablet   3   . sodium chloride (OCEAN) 0.65 % SOLN nasal spray   Each Nare   Place 1 spray into both nostrils as needed for congestion.         Marland Kitchen spironolactone (ALDACTONE) 25  MG tablet   Oral   Take 1 tablet (25 mg total) by mouth daily.   30 tablet   6   . tobramycin (TOBREX) 0.3 % ophthalmic solution   Left Eye   Place 1 drop into the left eye every 4 (four) hours.   5 mL   0     Allergies Review of patient's allergies indicates no known allergies.  Family History  Problem Relation Age of Onset  . Diabetes    . Cancer    . Hypertension    . Hypertension Mother   . Diabetes Mother   . Cancer Mother   . Hypertension Father     Social History History  Substance Use Topics  . Smoking status: Never Smoker   . Smokeless tobacco: Not on file  . Alcohol Use: No    Review of Systems Constitutional: No fever/chills Eyes: States pain and decreased vision. ENT: No sore throat. Cardiovascular: Denies chest pain. Respiratory: Denies shortness of breath. Gastrointestinal: No abdominal pain.  No nausea, no vomiting.  No diarrhea.  No constipation. Genitourinary: Negative for dysuria. Musculoskeletal: Negative  for back pain. Skin: Negative for rash. Neurological: Negative for headaches, focal weakness or numbness. 10-point ROS otherwise negative.  ____________________________________________   PHYSICAL EXAM:  VITAL SIGNS: ED Triage Vitals  Enc Vitals Group     BP 06/10/15 1826 187/86 mmHg     Pulse Rate 06/10/15 1826 80     Resp 06/10/15 1826 20     Temp 06/10/15 1826 98.6 F (37 C)     Temp Source 06/10/15 1826 Oral     SpO2 06/10/15 1826 96 %     Weight 06/10/15 1826 240 lb (108.863 kg)     Height 06/10/15 1826 6' (1.829 m)     Head Cir --      Peak Flow --      Pain Score 06/10/15 1828 10     Pain Loc --      Pain Edu? --      Excl. in [place]? --     Constitutional: Alert and oriented. Well appearing and in no acute distress. Eyes: Conjunctivae are normal. PERRL. EOMI. visual acuity right eye was 20/25 vision acuity left affected eye is 20/20. There is a 1-2 mm cornea abrasion Center of the eye. No vomiting body visible after staining the eye.  Head: Atraumatic. Nose: No congestion/rhinnorhea. Mouth/Throat: Mucous membranes are moist.  Oropharynx non-erythematous. Neck: No stridor. No deformity for nuchal range of motion nontender palpation. Hematological/Lymphatic/Immunilogical: No cervical lymphadenopathy. Cardiovascular: Normal rate, regular rhythm. Grossly normal heart sounds.  Good peripheral circulation. Elevated BP. Patient state he took his medication today. Blood pressure was retaken before discharge. Repeat was BP 197/100. Respiratory: Normal respiratory effort.  No retractions. Lungs CTAB. Gastrointestinal: Soft and nontender. No distention. No abdominal bruits. No CVA tenderness. Musculoskeletal: No lower extremity tenderness nor edema.  No joint effusions. Neurologic:  Normal speech and language. No gross focal neurologic deficits are appreciated. Speech is normal. No gait instability. Skin:  Skin is warm, dry and intact. No rash noted. Psychiatric: Mood and affect are  normal. Speech and behavior are normal.  ____________________________________________   LABS (all labs ordered are listed, but only abnormal results are displayed)  Labs Reviewed - No data to display ____________________________________________  EKG   ____________________________________________  RADIOLOGY   ____________________________________________   PROCEDURES  Procedure(s) performed: None  Critical Care performed: No  ____________________________________________   INITIAL IMPRESSION / ASSESSMENT AND PLAN / ED  COURSE  Pertinent labs & imaging results that were available during my care of the patient were reviewed by me and considered in my medical decision making (see chart for details).  Cornea abrasion status post foreign body removal or earlier today. Elevated blood pressure. ____________________________________________   FINAL CLINICAL IMPRESSION(S) / ED DIAGNOSES  Final diagnoses:  Corneal abrasion, left, subsequent encounter      Sable Feil, PA-C 06/10/15 [place], MD 06/12/15 [phone]

## 2015-06-10 NOTE — ED Notes (Signed)
Pt states that he started having pain in the left eye with itching, watering yeterday

## 2015-06-10 NOTE — ED Notes (Signed)
Visual acuity  R eye 20/25 L eye 20/20

## 2015-06-10 NOTE — Discharge Instructions (Signed)
Continue previous medications and follow up with eye clinic in 2 days if no improvement. Advised 3 day Blood Pressure check by Family Doctor due to elevate reading of 197/100 and taking medications for this condition.

## 2015-06-10 NOTE — ED Notes (Signed)
Was seen earlier for something in eye, states PA got something out, appeared to be metal, states was told to come back if pain was worse to return for recheck, pain has increased since discharge and using drops and taking vicodin

## 2015-07-13 ENCOUNTER — Other Ambulatory Visit (HOSPITAL_COMMUNITY): Payer: Self-pay | Admitting: Internal Medicine

## 2015-08-26 ENCOUNTER — Other Ambulatory Visit (HOSPITAL_COMMUNITY): Payer: Self-pay | Admitting: Internal Medicine

## 2015-10-20 ENCOUNTER — Other Ambulatory Visit (HOSPITAL_COMMUNITY): Payer: Self-pay | Admitting: Internal Medicine

## 2015-10-23 ENCOUNTER — Telehealth: Payer: Self-pay | Admitting: Physician Assistant

## 2015-10-23 DIAGNOSIS — I509 Heart failure, unspecified: Secondary | ICD-10-CM

## 2015-10-23 MED ORDER — CARVEDILOL 25 MG PO TABS: 25.0000 mg | ORAL_TABLET | Freq: Two times a day (BID) | ORAL | Status: DC

## 2015-10-23 MED ORDER — FUROSEMIDE 40 MG PO TABS: 40.0000 mg | ORAL_TABLET | Freq: Every day | ORAL | Status: DC

## 2015-10-23 NOTE — Telephone Encounter (Signed)
Patient called after-hours answering service inquiring about refill. Says he went to pick up rx at [facility] and they did not have refills authorized on his Coreg and Lasix. He says he sent in request a few days ago for this. I verified he is still taking Coreg 25mg  BID and Lasix 40mg  daily. He has not been in to see CHF clinic since 12/2013 which may be why the CHF Clinic has not yet authorized his refill. I offered to refill for 2 weeks while the clinic reviews his chart - I urged patient to call office tomorrow during business hours to schedule appointment and discuss clearance for further refills.  Sivan Quast PA-C

## 2015-10-24 ENCOUNTER — Telehealth (HOSPITAL_COMMUNITY): Payer: Self-pay | Admitting: Vascular Surgery

## 2015-10-24 NOTE — Telephone Encounter (Signed)
PT wants to know if he could have 14 fluid pills called in , he will be out before his appt Nov 17.Marland Kitchen Please advise

## 2015-10-24 NOTE — Telephone Encounter (Signed)
appt sch 11/17

## 2015-10-25 NOTE — Telephone Encounter (Signed)
Addressed by other means on 10/23/15

## 2015-11-16 ENCOUNTER — Encounter (HOSPITAL_COMMUNITY): Payer: Self-pay | Admitting: Internal Medicine

## 2015-11-16 ENCOUNTER — Ambulatory Visit (HOSPITAL_COMMUNITY)
Admission: RE | Admit: 2015-11-16 | Discharge: 2015-11-16 | Disposition: A | Discharge status: Home / Self Care | Payer: Self-pay | Source: Ambulatory Visit | Attending: Internal Medicine | Admitting: Internal Medicine

## 2015-11-16 VITALS — BP 142/84 | HR 82 | Wt 242.5 lb

## 2015-11-16 DIAGNOSIS — I5022 Chronic systolic (congestive) heart failure: Secondary | ICD-10-CM | POA: Insufficient documentation

## 2015-11-16 DIAGNOSIS — I10 Essential (primary) hypertension: Secondary | ICD-10-CM

## 2015-11-16 DIAGNOSIS — I110 Hypertensive heart disease with heart failure: Secondary | ICD-10-CM | POA: Insufficient documentation

## 2015-11-16 DIAGNOSIS — I509 Heart failure, unspecified: Secondary | ICD-10-CM

## 2015-11-16 MED ORDER — FUROSEMIDE 40 MG PO TABS: 40.0000 mg | ORAL_TABLET | Freq: Every day | ORAL | Status: DC

## 2015-11-16 MED ORDER — CARVEDILOL 25 MG PO TABS: 25.0000 mg | ORAL_TABLET | Freq: Two times a day (BID) | ORAL | Status: DC

## 2015-11-16 MED ORDER — SPIRONOLACTONE 25 MG PO TABS: 25.0000 mg | ORAL_TABLET | Freq: Every day | ORAL | Status: DC

## 2015-11-16 MED ORDER — LISINOPRIL 20 MG PO TABS: 20.0000 mg | ORAL_TABLET | Freq: Two times a day (BID) | ORAL | Status: DC

## 2015-11-16 NOTE — Progress Notes (Signed)
Advanced Heart Failure Medication Review by a Pharmacist  Does the patient  feel that his/her medications are working for him/her?  yes  Has the patient been experiencing any side effects to the medications prescribed?  no  Does the patient measure his/her own blood pressure or blood glucose at home?  no   Does the patient have any problems obtaining medications due to transportation or finances?   no  Understanding of regimen: good Understanding of indications: good Potential of compliance: fair Patient understands to avoid NSAIDs. Patient understands to avoid decongestants.  Issues to address at subsequent visits: None   Pharmacist comments:  Jared Hart is a pleasant 49 yo M presenting without a medication list but with good recall of his regimen including dosages. He states that up until ~1 week ago he was compliant with his medications but he ran out of refills and was told he needed an appointment before they could be refilled. He did not have any medication-related questions or concerns at this time.   Ruta Hinds. Velva Harman, PharmD, BCPS, CPP Clinical Pharmacist Pager: [phone] Phone: [phone] 11/16/2015 2:56 PM      Time with patient: 8 minutes Preparation and documentation time: 2 minutes Total time: 10 minutes

## 2015-11-16 NOTE — Progress Notes (Signed)
Patient ID: Jared Hart, male   DOB: [birth year], 49 y.o.   MRN: [id number]   PCP: None HF Cardiologist: Dr Haroldine Laws  HPI: Jared Hart is a 49 y/o male with HTN and DM2. Initially admitted in May 2011 for dyspnea and CP. Cath 2011 with normal coronaries and EF 25% (felt to be hypertensive cardiomyopathy).    Discharged from [facility] September 2013 after being treated for recurrent HF after being out of his meds for 6 months. Diuresed and feels better. ECHO EF 40-45%.   Today he is returns for HF follow up: He hasnt been seen in a year. Out of meds for 1 week. Overall feels good.  Denies SOB/PND/Orthopnea. Following low salt diet. Not weighing at home.  Compliant with medications. Following low sat diet. SBP at home 130-140s.   ECHO 12/24/12 EF 50%  ECHO 01/24/14: EF 45-50%   Labs     (03/30/13): K+ 4.6, Cr 1/07  (11/01/13): LDL 81, K+ 4.4, Glu 366, Cr 1.0, TG 447, HDL 34   ROS: All systems negative except as listed in HPI, PMH and Problem List.  Past Medical History  Diagnosis Date  . Diabetes mellitus   . CHF (congestive heart failure) ([place])   . Colon cancer ([place])   . Hypercholesteremia   . HTN (hypertension)     Current Outpatient Prescriptions  Medication Sig Dispense Refill  . aspirin 81 MG EC tablet Take 1 tablet (81 mg total) by mouth daily. 30 tablet 6  . carvedilol (COREG) 25 MG tablet Take 1 tablet (25 mg total) by mouth 2 (two) times daily with a meal. 28 tablet 0  . furosemide (LASIX) 40 MG tablet Take 1 tablet (40 mg total) by mouth daily. 14 tablet 0  . glipiZIDE (GLUCOTROL) 5 MG tablet Take 1 tablet (5 mg total) by mouth 2 (two) times daily before a meal. 60 tablet 3  . lisinopril (PRINIVIL,ZESTRIL) 20 MG tablet Take 1 tablet (20 mg total) by mouth 2 (two) times daily. 60 tablet 6  . metFORMIN (GLUCOPHAGE) 500 MG tablet Take 1 tablet (500 mg total) by mouth 2 (two) times daily with a meal. 60 tablet 3  . sodium chloride (OCEAN) 0.65 % SOLN nasal spray Place 1 spray into both  nostrils as needed for congestion.    Marland Kitchen spironolactone (ALDACTONE) 25 MG tablet TAKE ONE TABLET BY MOUTH ONCE DAILY 30 tablet 0   No current facility-administered medications for this encounter.    Filed Vitals:   11/16/15 1431  BP: 142/84  Pulse: 82  Weight: 242 lb 8 oz (109.997 kg)  SpO2: 99%    PHYSICAL EXAM: General:  Well appearing. No resp difficulty HEENT: normal Neck: supple. JVP flat. Carotids 2+ bilaterally; no bruits. No lymphadenopathy or thryomegaly appreciated. Cor: PMI normal. Regular rate & rhythm. No rubs, gallops or murmurs. +S4 Lungs: clear Abdomen: soft, nontender, nondistended. No hepatosplenomegaly. No bruits or masses. Good bowel sounds. Extremities: no cyanosis, clubbing, rash, edema Neuro: alert & orientedx3, cranial nerves grossly intact. Moves all 4 extremities w/o difficulty. Affect pleasant.   ASSESSMENT & PLAN:  1) Chronic systolic HF, NICM,  EF [id number] (12/2013) - NYHA I. Volume status ok.  Has been out of meds for 1 week. Today I will restart carvedilol, lisinopril, lasix and spironolactone. Check BMET next week.  Repeat ECHO next visit.  - Reinforced the need and importance of daily weights, a low sodium diet, and fluid restriction (less than 2 L a day). Instructed to call the HF clinic  if weight increases more than 3 lbs overnight or 5 lbs in a week.  2) HTN-  Slightly elevated but has been out medications. Restart HF meds as above.   Follow up in 6 months with an ECHO.   Darrick Grinder NP-C 2:47 PM  Patient seen and examined with Darrick Grinder, NP. We discussed all aspects of the encounter. I agree with the assessment and plan as stated above.   He has been doing well despite being out of meds for 1 week. Will restart. Stressed need for better compliance and contact us if running out. Will repeat echo at next visit. Last EF 45-50% so not ICD candidate.  Kaliopi Blyden,MD 2:53 PM

## 2015-11-16 NOTE — Patient Instructions (Signed)
Restart all medications  Labs in 1 week  Your physician has requested that you have an echocardiogram. Echocardiography is a painless test that uses sound waves to create images of your heart. It provides your doctor with information about the size and shape of your heart and how well your heart's chambers and valves are working. This procedure takes approximately one hour. There are no restrictions for this procedure.  IN 6 MONTHS  We will contact you in 6 months to schedule your next appointment.

## 2015-11-22 ENCOUNTER — Ambulatory Visit (HOSPITAL_COMMUNITY)
Admission: RE | Admit: 2015-11-22 | Discharge: 2015-11-22 | Disposition: A | Discharge status: Home / Self Care | Payer: Self-pay | Source: Ambulatory Visit | Attending: Internal Medicine | Admitting: Internal Medicine

## 2015-11-22 DIAGNOSIS — I5022 Chronic systolic (congestive) heart failure: Secondary | ICD-10-CM | POA: Insufficient documentation

## 2015-11-22 LAB — BASIC METABOLIC PANEL
Anion gap: 8 (ref 5–15)
BUN: 17 mg/dL (ref 6–20)
CO2: 27 mmol/L (ref 22–32)
CREATININE: 1.02 mg/dL (ref 0.61–1.24)
Calcium: 9.4 mg/dL (ref 8.9–10.3)
Chloride: 97 mmol/L — ABNORMAL LOW (ref 101–111)
GFR calc Af Amer: >60 mL/min (ref >60)
Glucose, Bld: 362 mg/dL — ABNORMAL HIGH (ref 65–99)
POTASSIUM: 4.6 mmol/L (ref 3.5–5.1)
SODIUM: 132 mmol/L — AB (ref 135–145)

## 2016-07-08 ENCOUNTER — Encounter (HOSPITAL_COMMUNITY): Payer: Self-pay | Admitting: Emergency Medicine

## 2016-07-08 ENCOUNTER — Emergency Department (HOSPITAL_COMMUNITY): Payer: Self-pay

## 2016-07-08 ENCOUNTER — Observation Stay (HOSPITAL_COMMUNITY): Payer: Self-pay

## 2016-07-08 ENCOUNTER — Inpatient Hospital Stay (HOSPITAL_COMMUNITY)
Admission: EM | Admit: 2016-07-08 | Discharge: 2016-07-10 | DRG: 065 | Disposition: A | Discharge status: Home / Self Care | Payer: Self-pay | Attending: Internal Medicine | Admitting: Internal Medicine

## 2016-07-08 DIAGNOSIS — E7800 Pure hypercholesterolemia, unspecified: Secondary | ICD-10-CM | POA: Diagnosis present

## 2016-07-08 DIAGNOSIS — G459 Transient cerebral ischemic attack, unspecified: Secondary | ICD-10-CM | POA: Diagnosis present

## 2016-07-08 DIAGNOSIS — Z720 Tobacco use: Secondary | ICD-10-CM

## 2016-07-08 DIAGNOSIS — I161 Hypertensive emergency: Secondary | ICD-10-CM | POA: Diagnosis present

## 2016-07-08 DIAGNOSIS — E1149 Type 2 diabetes mellitus with other diabetic neurological complication: Secondary | ICD-10-CM

## 2016-07-08 DIAGNOSIS — I10 Essential (primary) hypertension: Secondary | ICD-10-CM | POA: Diagnosis present

## 2016-07-08 DIAGNOSIS — E871 Hypo-osmolality and hyponatremia: Secondary | ICD-10-CM | POA: Diagnosis present

## 2016-07-08 DIAGNOSIS — I6320 Cerebral infarction due to unspecified occlusion or stenosis of unspecified precerebral arteries: Secondary | ICD-10-CM | POA: Insufficient documentation

## 2016-07-08 DIAGNOSIS — Z833 Family history of diabetes mellitus: Secondary | ICD-10-CM

## 2016-07-08 DIAGNOSIS — Z7982 Long term (current) use of aspirin: Secondary | ICD-10-CM

## 2016-07-08 DIAGNOSIS — Z8249 Family history of ischemic heart disease and other diseases of the circulatory system: Secondary | ICD-10-CM

## 2016-07-08 DIAGNOSIS — R2990 Unspecified symptoms and signs involving the nervous system: Secondary | ICD-10-CM | POA: Diagnosis present

## 2016-07-08 DIAGNOSIS — E785 Hyperlipidemia, unspecified: Secondary | ICD-10-CM

## 2016-07-08 DIAGNOSIS — I63439 Cerebral infarction due to embolism of unspecified posterior cerebral artery: Secondary | ICD-10-CM

## 2016-07-08 DIAGNOSIS — I6521 Occlusion and stenosis of right carotid artery: Secondary | ICD-10-CM | POA: Diagnosis present

## 2016-07-08 DIAGNOSIS — E663 Overweight: Secondary | ICD-10-CM | POA: Diagnosis present

## 2016-07-08 DIAGNOSIS — I509 Heart failure, unspecified: Secondary | ICD-10-CM

## 2016-07-08 DIAGNOSIS — R471 Dysarthria and anarthria: Secondary | ICD-10-CM | POA: Diagnosis present

## 2016-07-08 DIAGNOSIS — Z79899 Other long term (current) drug therapy: Secondary | ICD-10-CM

## 2016-07-08 DIAGNOSIS — Z809 Family history of malignant neoplasm, unspecified: Secondary | ICD-10-CM

## 2016-07-08 DIAGNOSIS — R911 Solitary pulmonary nodule: Secondary | ICD-10-CM | POA: Diagnosis present

## 2016-07-08 DIAGNOSIS — I5042 Chronic combined systolic (congestive) and diastolic (congestive) heart failure: Secondary | ICD-10-CM | POA: Diagnosis present

## 2016-07-08 DIAGNOSIS — Z7984 Long term (current) use of oral hypoglycemic drugs: Secondary | ICD-10-CM

## 2016-07-08 DIAGNOSIS — E1165 Type 2 diabetes mellitus with hyperglycemia: Secondary | ICD-10-CM | POA: Diagnosis present

## 2016-07-08 DIAGNOSIS — I639 Cerebral infarction, unspecified: Secondary | ICD-10-CM | POA: Diagnosis present

## 2016-07-08 DIAGNOSIS — Z6827 Body mass index (BMI) 27.0-27.9, adult: Secondary | ICD-10-CM

## 2016-07-08 DIAGNOSIS — I6340 Cerebral infarction due to embolism of unspecified cerebral artery: Principal | ICD-10-CM | POA: Diagnosis present

## 2016-07-08 DIAGNOSIS — R29898 Other symptoms and signs involving the musculoskeletal system: Secondary | ICD-10-CM

## 2016-07-08 DIAGNOSIS — C189 Malignant neoplasm of colon, unspecified: Secondary | ICD-10-CM | POA: Diagnosis present

## 2016-07-08 DIAGNOSIS — I110 Hypertensive heart disease with heart failure: Secondary | ICD-10-CM | POA: Diagnosis present

## 2016-07-08 LAB — COMPREHENSIVE METABOLIC PANEL
ALT: 11 U/L — AB (ref 17–63)
AST: 21 U/L (ref 15–41)
Albumin: 4.1 g/dL (ref 3.5–5.0)
Alkaline Phosphatase: 79 U/L (ref 38–126)
Anion gap: 10 (ref 5–15)
BUN: 11 mg/dL (ref 6–20)
CALCIUM: 9.6 mg/dL (ref 8.9–10.3)
CO2: 23 mmol/L (ref 22–32)
CREATININE: 1.03 mg/dL (ref 0.61–1.24)
Chloride: 99 mmol/L — ABNORMAL LOW (ref 101–111)
GFR calc Af Amer: >60 mL/min (ref >60)
GFR calc non Af Amer: >60 mL/min (ref >60)
Glucose, Bld: 434 mg/dL — ABNORMAL HIGH (ref 65–99)
Potassium: 4.3 mmol/L (ref 3.5–5.1)
Sodium: 132 mmol/L — ABNORMAL LOW (ref 135–145)
TOTAL PROTEIN: 7.3 g/dL (ref 6.5–8.1)
Total Bilirubin: 1.1 mg/dL (ref 0.3–1.2)

## 2016-07-08 LAB — GLUCOSE, CAPILLARY: Glucose-Capillary: 367 mg/dL — ABNORMAL HIGH (ref 65–99)

## 2016-07-08 LAB — I-STAT CHEM 8, ED
BUN: 13 mg/dL (ref 6–20)
CALCIUM ION: 1.16 mmol/L (ref 1.13–1.30)
CHLORIDE: 96 mmol/L — AB (ref 101–111)
Creatinine, Ser: 0.9 mg/dL (ref 0.61–1.24)
GLUCOSE: 452 mg/dL — AB (ref 65–99)
HCT: 45 % (ref 39.0–52.0)
HEMOGLOBIN: 15.3 g/dL (ref 13.0–17.0)
Potassium: 4.2 mmol/L (ref 3.5–5.1)
Sodium: 134 mmol/L — ABNORMAL LOW (ref 135–145)
TCO2: 25 mmol/L (ref 0–100)

## 2016-07-08 LAB — DIFFERENTIAL
Basophils Absolute: 0 10*3/uL (ref 0.0–0.1)
Basophils Relative: 0 %
Eosinophils Absolute: 0.1 10*3/uL (ref 0.0–0.7)
Eosinophils Relative: 1 %
Lymphocytes Relative: 22 %
Lymphs Abs: 2 10*3/uL (ref 0.7–4.0)
MONOS PCT: 5 %
Monocytes Absolute: 0.4 10*3/uL (ref 0.1–1.0)
NEUTROS ABS: 6.6 10*3/uL (ref 1.7–7.7)
Neutrophils Relative %: 72 %

## 2016-07-08 LAB — URINALYSIS, DIPSTICK ONLY
Bilirubin Urine: NEGATIVE
Glucose, UA: >1000 mg/dL — AB
KETONES UR: NEGATIVE mg/dL
LEUKOCYTES UA: NEGATIVE
NITRITE: NEGATIVE
PROTEIN: 100 mg/dL — AB
Specific Gravity, Urine: 1.03 (ref 1.005–1.030)
pH: 6 (ref 5.0–8.0)

## 2016-07-08 LAB — SODIUM, URINE, RANDOM: SODIUM UR: 66 mmol/L

## 2016-07-08 LAB — I-STAT TROPONIN, ED: Troponin i, poc: 0.01 ng/mL (ref 0.00–0.08)

## 2016-07-08 LAB — CBC
HEMATOCRIT: 39 % (ref 39.0–52.0)
Hemoglobin: 13.5 g/dL (ref 13.0–17.0)
MCH: 29.1 pg (ref 26.0–34.0)
MCHC: 34.6 g/dL (ref 30.0–36.0)
MCV: 84.1 fL (ref 78.0–100.0)
Platelets: 271 10*3/uL (ref 150–400)
RBC: 4.64 MIL/uL (ref 4.22–5.81)
RDW: 12.7 % (ref 11.5–15.5)
WBC: 9.1 10*3/uL (ref 4.0–10.5)

## 2016-07-08 LAB — OSMOLALITY, URINE: OSMOLALITY UR: 625 mosm/kg (ref 300–900)

## 2016-07-08 LAB — TROPONIN I: TROPONIN I: 0.03 ng/mL — AB (ref <0.03)

## 2016-07-08 LAB — CREATININE, URINE, RANDOM: CREATININE, URINE: 70.39 mg/dL

## 2016-07-08 MED ORDER — ASPIRIN EC 325 MG PO TBEC
325.0000 mg | DELAYED_RELEASE_TABLET | Freq: Once | ORAL | Status: AC
  Administered 2016-07-08: 325 mg via ORAL
  Filled 2016-07-08: qty 1

## 2016-07-08 MED ORDER — SODIUM CHLORIDE 0.9 % IV SOLN
INTRAVENOUS | Status: DC
  Administered 2016-07-08: 21:00:00 via INTRAVENOUS

## 2016-07-08 MED ORDER — ACETAMINOPHEN 325 MG PO TABS
650.0000 mg | ORAL_TABLET | ORAL | Status: DC | PRN
  Administered 2016-07-10: 650 mg via ORAL
  Filled 2016-07-08: qty 2

## 2016-07-08 MED ORDER — SPIRONOLACTONE 25 MG PO TABS
25.0000 mg | ORAL_TABLET | Freq: Every day | ORAL | Status: DC
  Administered 2016-07-09 – 2016-07-10 (×2): 25 mg via ORAL
  Filled 2016-07-08 (×3): qty 1

## 2016-07-08 MED ORDER — ASPIRIN 300 MG RE SUPP: 300.0000 mg | Freq: Every day | RECTAL | Status: DC

## 2016-07-08 MED ORDER — LISINOPRIL 20 MG PO TABS
20.0000 mg | ORAL_TABLET | Freq: Two times a day (BID) | ORAL | Status: DC
  Administered 2016-07-09: 20 mg via ORAL
  Filled 2016-07-08: qty 1

## 2016-07-08 MED ORDER — STROKE: EARLY STAGES OF RECOVERY BOOK
Freq: Once | Status: AC
  Administered 2016-07-08: 21:00:00
  Filled 2016-07-08: qty 1

## 2016-07-08 MED ORDER — GLIPIZIDE 5 MG PO TABS
5.0000 mg | ORAL_TABLET | Freq: Two times a day (BID) | ORAL | Status: DC
  Administered 2016-07-09: 5 mg via ORAL
  Filled 2016-07-08: qty 1

## 2016-07-08 MED ORDER — INSULIN ASPART 100 UNIT/ML ~~LOC~~ SOLN
0.0000 [IU] | Freq: Three times a day (TID) | SUBCUTANEOUS | Status: DC
  Administered 2016-07-09: 2 [IU] via SUBCUTANEOUS
  Administered 2016-07-09: 5 [IU] via SUBCUTANEOUS
  Administered 2016-07-09 – 2016-07-10 (×2): 8 [IU] via SUBCUTANEOUS
  Administered 2016-07-10: 5 [IU] via SUBCUTANEOUS

## 2016-07-08 MED ORDER — INSULIN ASPART 100 UNIT/ML ~~LOC~~ SOLN
0.0000 [IU] | Freq: Every day | SUBCUTANEOUS | Status: DC
  Administered 2016-07-08: 5 [IU] via SUBCUTANEOUS

## 2016-07-08 MED ORDER — ASPIRIN 325 MG PO TABS
325.0000 mg | ORAL_TABLET | Freq: Every day | ORAL | Status: DC
  Administered 2016-07-09 – 2016-07-10 (×2): 325 mg via ORAL
  Filled 2016-07-08 (×2): qty 1

## 2016-07-08 MED ORDER — CARVEDILOL 12.5 MG PO TABS
25.0000 mg | ORAL_TABLET | Freq: Two times a day (BID) | ORAL | Status: DC
  Administered 2016-07-09: 25 mg via ORAL
  Filled 2016-07-08: qty 2

## 2016-07-08 MED ORDER — ACETAMINOPHEN 650 MG RE SUPP
650.0000 mg | RECTAL | Status: DC | PRN
Start: 2016-07-08 — End: 2016-07-10

## 2016-07-08 MED ORDER — SENNOSIDES-DOCUSATE SODIUM 8.6-50 MG PO TABS: 1.0000 | ORAL_TABLET | Freq: Every evening | ORAL | Status: DC | PRN

## 2016-07-08 NOTE — Consult Note (Signed)
NEURO HOSPITALIST CONSULT NOTE    Reason for Consult:Stroke/TIA   HPI:                                                                                                                                          Jared Hart is an 50 y.o. male with history of diabetes, CHF, hypertension presented with acute onset of arm clumsiness which started yesterday. Patient thinks his symptoms are mostly improved except very mild weakness/clumsiness and numbness in left hand. His symptoms started yesterday afternoon later in the night he felt some unbalanced gait which also improved. Upon awakening he thought his symptoms are mostly improved but while at work he kept on dropping stuff with left hand. Patient takes aspirin 81 mg daily. He chews tobacco. CT scan of the head did not show any acute abnormalities. Currently denies any chest pain, palpitation, fever, chills, headache, no change in vision bowel or urinary habits   Past Medical History  Diagnosis Date  . Diabetes mellitus   . CHF (congestive heart failure) ([place])   . Colon cancer ([place])   . Hypercholesteremia   . HTN (hypertension)     Past Surgical History  Procedure Laterality Date  . Cholecystectomy    . Appendectomy    . Colon surgery      Family History  Problem Relation Age of Onset  . Diabetes    . Cancer    . Hypertension    . Hypertension Mother   . Diabetes Mother   . Cancer Mother   . Hypertension Father      Social History:Chews tobacco on daily basis  No Known Allergies  Medication Sig Dispense Refill  . aspirin 81 MG EC tablet Take 1 tablet (81 mg total) by mouth daily. 30 tablet 6  . carvedilol (COREG) 25 MG tablet Take 1 tablet (25 mg total) by mouth 2 (two) times daily with a meal. 28 tablet 0  . furosemide (LASIX) 40 MG tablet Take 1 tablet (40 mg total) by mouth daily. 14 tablet 0  . glipiZIDE (GLUCOTROL) 5 MG tablet Take 1 tablet (5 mg total) by mouth 2 (two)  times daily before a meal. 60 tablet 3  . lisinopril (PRINIVIL,ZESTRIL) 20 MG tablet Take 1 tablet (20 mg total) by mouth 2 (two) times daily. 60 tablet 6  . metFORMIN (GLUCOPHAGE) 500 MG tablet Take 1 tablet (500 mg total) by mouth 2 (two) times daily with a meal. 60 tablet 3  . sodium chloride (OCEAN) 0.65 % SOLN nasal spray Place 1 spray into both nostrils as needed for congestion.    Marland Kitchen spironolactone (ALDACTONE) 25 MG tablet TAKE ONE TABLET BY MOUTH ONCE DAILY 30 tablet 0         ROS:  As mentioned in history of present illness    Blood pressure 169/92, pulse 82, temperature 98.3 F (36.8 C), temperature source Oral, resp. rate 22, SpO2 96 %.   Neurologic Examination:                                                                                                      General sitting in bed and appears comfortable Lungs clear to auscultation CVS S1-S2 regular Neck without any lymphadenopathy or carotid bruit Skin no signs or scars or bruises Psychiatric normal inside Neurologic alert and oriented 3 Cranial nerves II through XII intact Motor exam did not reveal any major focal weakness he does have very mild clumsiness of left hand Deep tendon reflexes +1 bilaterally Sensory subjective feeling of numbness in left hand Speech fluent Gait not assessed  Lab Results: Basic Metabolic Panel:  Recent Labs Lab 07/08/16 1300 07/08/16 1333  NA 132* 134*  K 4.3 4.2  CL 99* 96*  CO2 23  --   GLUCOSE 434* 452*  BUN 11 13  CREATININE 1.03 0.90  CALCIUM 9.6  --     Liver Function Tests:  Recent Labs Lab 07/08/16 1300  AST 21  ALT 11*  ALKPHOS 79  BILITOT 1.1  PROT 7.3  ALBUMIN 4.1   No results for input(s): LIPASE, AMYLASE in the last 168 hours. No results for input(s): AMMONIA in the last 168  hours.  CBC:  Recent Labs Lab 07/08/16 1300 07/08/16 1333  WBC 9.1  --   NEUTROABS 6.6  --   HGB 13.5 15.3  HCT 39.0 45.0  MCV 84.1  --   PLT 271  --     Cardiac Enzymes: No results for input(s): CKTOTAL, CKMB, CKMBINDEX, TROPONINI in the last 168 hours.  Lipid Panel: No results for input(s): CHOL, TRIG, HDL, CHOLHDL, VLDL, LDLCALC in the last 168 hours.  CBG: No results for input(s): GLUCAP in the last 168 hours.  Microbiology: Results for orders placed or performed during the hospital encounter of 04/01/15  Culture, blood (routine x 2)     Status: None   Collection Time: 04/01/15  6:28 PM  Result Value Ref Range Status   Specimen Description BLOOD LEFT ARM  Final   Special Requests BOTTLES DRAWN AEROBIC ONLY 4 CC  Final   Culture   Final    NO GROWTH 5 DAYS Performed at [facility]    Report Status 04/08/2015 FINAL  Final  Culture, blood (routine x 2)     Status: None   Collection Time: 04/01/15  7:33 PM  Result Value Ref Range Status   Specimen Description BLOOD RIGHT FOREARM  Final   Special Requests BOTTLES DRAWN AEROBIC AND ANAEROBIC 10CC EA  Final   Culture   Final    NO GROWTH 5 DAYS Performed at [facility]    Report Status 04/08/2015 FINAL  Final  Urine culture     Status: None   Collection Time: 04/01/15  8:38 PM  Result Value Ref Range Status   Specimen Description URINE, CLEAN CATCH  Final   Special Requests NONE  Final   Colony Count NO GROWTH Performed at [facility]   Final   Culture NO GROWTH Performed at [facility]   Final   Report Status 04/03/2015 FINAL  Final  Culture, sputum-assessment     Status: None   Collection Time: 04/02/15  9:03 AM  Result Value Ref Range Status   Specimen Description SPUTUM  Final   Special Requests NONE  Final   Sputum evaluation   Final    THIS SPECIMEN IS ACCEPTABLE. RESPIRATORY CULTURE REPORT TO FOLLOW.   Report Status 04/02/2015 FINAL  Final  Culture, respiratory  (NON-Expectorated)     Status: None   Collection Time: 04/02/15  9:03 AM  Result Value Ref Range Status   Specimen Description SPUTUM  Final   Special Requests NONE  Final   Gram Stain   Final    FEW WBC PRESENT,BOTH PMN AND MONONUCLEAR NO SQUAMOUS EPITHELIAL CELLS SEEN NO ORGANISMS SEEN Performed at [facility]    Culture   Final    NORMAL OROPHARYNGEAL FLORA Performed at [facility]    Report Status 04/04/2015 FINAL  Final  Respiratory virus panel     Status: Abnormal   Collection Time: 04/02/15  9:18 AM  Result Value Ref Range Status   Source - RVPAN NASOPHARYNGEAL  Corrected   Respiratory Syncytial Virus A Negative Negative Final   Respiratory Syncytial Virus B Negative Negative Final   Influenza A Negative Negative Final   Influenza B Negative Negative Final   Parainfluenza 1 Negative Negative Final   Parainfluenza 2 Negative Negative Final   Parainfluenza 3 Negative Negative Final   Metapneumovirus Negative Negative Final   Rhinovirus Positive (A) Negative Final   Adenovirus Negative Negative Final    Comment: (NOTE) Performed At: [facility] [address] [place], [place] [id number] Lindon Romp MD [id number]     Coagulation Studies: No results for input(s): LABPROT, INR in the last 72 hours.  Imaging: Ct Head Wo Contrast  07/08/2016  CLINICAL DATA:  Left arm weakness numbness and tingling. Transient gait and speech disturbances. EXAM: CT HEAD WITHOUT CONTRAST TECHNIQUE: Contiguous axial images were obtained from the base of the skull through the vertex without intravenous contrast. COMPARISON:  None. FINDINGS: Brain: No evidence of acute hemorrhage, extra-axial collection, ventriculomegaly, or mass effect. There are areas of hypoattenuation within the right basal ganglia and the subcortical white matter of the right frontal and parietal lobes. A hypoattenuated focus is also seen within the right cerebellar peduncle. Vascular: No  hyperdense vessel or unexpected calcification. Skull: Negative for fracture or focal lesion. Sinuses/Orbits: There is moderate to severe polypoid mucosal thickening of bilateral maxillary sinuses, and milder mucosal thickening of the sphenoid sinuses. Other: None. IMPRESSION: Abnormal areas of hypoattenuation within the subcortical white matter of the right frontal and parietal lobes, and right basal ganglia, with appearance most consistent by CT with age-indeterminate infarcts. Additional focus of hypoattenuation within the right cerebellar peduncle with indeterminate appearance. Chronic sinusitis. These results were called by telephone at the time of interpretation on 07/08/2016 at 6:08 pm to Dr. Zenovia Jarred , who verbally acknowledged these results. Electronically Signed   By: Fidela Salisbury M.D.   On: 07/08/2016 18:13        07/08/2016, 6:56 PM   Assessment/Plan:   Jared Hart is an 50 y.o. male with history of diabetes, CHF, hypertension presented with acute onset of arm clumsiness which started yesterday.  Patient clinical history and exam is slightly suggestive of right hemispheric stroke. Diagnosis and associated risk factors were discussed in detail with the patient. Recommendation admit patient for complete stroke workup including MRI brain and MRA head and neck, echocardiogram, lipid panel, hemoglobin A1c. Continue aspirin and statin therapy for stroke prevention Patient will be followed by stroke team tomorrow.

## 2016-07-08 NOTE — ED Notes (Signed)
Patient transported to MRI 

## 2016-07-08 NOTE — ED Notes (Signed)
Attempting to page Dr. Roel Cluck

## 2016-07-08 NOTE — H&P (Signed)
EDDISON Hart [id number] DOB: 01-27-1966 DOA: 07/08/2016     PCP: Lorayne Marek, MD   Outpatient Specialists: [facility] Patient coming from:    home Lives   With family    Chief Complaint: Left arm weakness and numbness  HPI: Jared Hart is a 50 y.o. male with medical history significant of combined systolic diastolic heart failure, HTN, hypercholesterolemia, diabetes, colon cancer status post resection    Presented with intermittent symptoms of left arm weakness and numbness associated tingling slurred speech, abnormal gait started since yesterday after eating lunch. Acute could not use his left hand. When he got up to use the bathroom at night he was unable to walk due to stumbling gait. Wife was worried about slurred speech. He refused to go to the hospital but today when he went to work he was unable to use his stools and finally presented to emergency department currently symptoms have resolved. Patient takes daily aspirin does not smoke but chews tobacco   Regarding pertinent Chronic problems: Diabetes poorly controlled, hx of remote colon cancer at age of 23 sp resection. No hx of stroke of MI.    IN ER: Afebrile heart rate 89 respirations 24 blood pressure 164/83 but initial reading as high as 180/103 WBC 9.1 hemoglobin 13.5 sodium 132 gr 1.03 glucose 434 Case was discussed with neurology has seen the patient and consult Dr. Gifford Shave CT of the head done showing hypoattenuation within subcortical white matter right frontal parietal lobes in the right basal ganglia with age-indeterminate infarcts also area of hypoattenuation within the right cerebral peduncle intermediate   Hospitalist was called for admission for CVA versus TIA  Review of Systems:    Pertinent positives include:  localizing neurological complaints,  tingling,  Weakness, gait abnormality,   slurred speech,  Constitutional:  No weight loss, night sweats, Fevers, chills, fatigue, weight loss    HEENT:  No headaches, Difficulty swallowing,Tooth/dental problems,Sore throat,  No sneezing, itching, ear ache, nasal congestion, post nasal drip,  Cardio-vascular:  No chest pain, Orthopnea, PND, anasarca, dizziness, palpitations.no Bilateral lower extremity swelling  GI:  No heartburn, indigestion, abdominal pain, nausea, vomiting, diarrhea, change in bowel habits, loss of appetite, melena, blood in stool, hematemesis Resp:  no shortness of breath at rest. No dyspnea on exertion, No excess mucus, no productive cough, No non-productive cough, No coughing up of blood.No change in color of mucus.No wheezing. Skin:  no rash or lesions. No jaundice GU:  no dysuria, change in color of urine, no urgency or frequency. No straining to urinate.  No flank pain.  Musculoskeletal:  No joint pain or no joint swelling. No decreased range of motion. No back pain.  Psych:  No change in mood or affect. No depression or anxiety. No memory loss.  Neuro: no no double vision, no  no confusion  As per HPI otherwise 10 point review of systems negative.   Past Medical History: Past Medical History  Diagnosis Date  . Diabetes mellitus   . CHF (congestive heart failure) ([place])   . Colon cancer ([place])   . Hypercholesteremia   . HTN (hypertension)    Past Surgical History  Procedure Laterality Date  . Cholecystectomy    . Appendectomy    . Colon surgery       Social History:  Ambulatory   Independently     reports that he has never smoked. He does not have any smokeless tobacco history on file. He reports that he does not  drink alcohol or use illicit drugs.  Allergies:  No Known Allergies     Family History:    Family History  Problem Relation Age of Onset  . Diabetes    . Cancer    . Hypertension    . Hypertension Mother   . Diabetes Mother   . Cancer Mother   . Hypertension Father     Medications: Prior to Admission medications   Medication Sig Start Date End Date Taking?  Authorizing Provider  aspirin 81 MG EC tablet Take 1 tablet (81 mg total) by mouth daily. 06/10/14  Yes Jolaine Artist, MD  carvedilol (COREG) 25 MG tablet Take 1 tablet (25 mg total) by mouth 2 (two) times daily with a meal. 11/16/15  Yes Jolaine Artist, MD  furosemide (LASIX) 40 MG tablet Take 1 tablet (40 mg total) by mouth daily. 11/16/15  Yes Jolaine Artist, MD  glipiZIDE (GLUCOTROL) 5 MG tablet Take 1 tablet (5 mg total) by mouth 2 (two) times daily before a meal. 04/14/15  Yes Deepak Advani, MD  lisinopril (PRINIVIL,ZESTRIL) 20 MG tablet Take 1 tablet (20 mg total) by mouth 2 (two) times daily. 11/16/15  Yes Jolaine Artist, MD  metFORMIN (GLUCOPHAGE) 500 MG tablet Take 1 tablet (500 mg total) by mouth 2 (two) times daily with a meal. 04/14/15  Yes Deepak Advani, MD  spironolactone (ALDACTONE) 25 MG tablet Take 1 tablet (25 mg total) by mouth daily. 11/16/15  Yes Jolaine Artist, MD    Physical Exam: Patient Vitals for the past 24 hrs:  BP Temp Temp src Pulse Resp SpO2  07/08/16 1915 164/83 mmHg - - 89 24 97 %  07/08/16 1900 162/87 mmHg - - 84 22 98 %  07/08/16 1830 169/92 mmHg - - 82 22 96 %  07/08/16 1821 - 98.3 F (36.8 C) - - - -  07/08/16 1815 158/81 mmHg - - 83 21 95 %  07/08/16 1730 176/100 mmHg - - 84 20 97 %  07/08/16 1715 174/99 mmHg - - 85 23 96 %  07/08/16 1700 (!) 180/103 mmHg - - 87 18 98 %  07/08/16 1240 158/86 mmHg 97.8 F (36.6 C) Oral 98 20 96 %    1. General:  in No Acute distress 2. Psychological: Alert and Oriented 3. Head/ENT:    Dry Mucous Membranes                          Head Non traumatic, neck supple                            Poor Dentition 4. SKIN:  decreased Skin turgor,  Skin clean Dry and intact no rash 5. Heart: Regular rate and rhythm no  Murmur, Rub or gallop 6. Lungs:  Clear to auscultation bilaterally, no wheezes or crackles   7. Abdomen: Soft, non-tender, Non distended 8. Lower extremities: no clubbing, cyanosis, or  edema 9. Neurologically Strength 5/5 in all 4 ext. CN 2-12 intact.  10. MSK: Normal range of motion   body mass index is unknown because there is no weight on file.  Labs on Admission:   Labs on Admission: I have personally reviewed following labs and imaging studies  CBC:  Recent Labs Lab 07/08/16 1300 07/08/16 1333  WBC 9.1  --   NEUTROABS 6.6  --   HGB 13.5 15.3  HCT 39.0 45.0  MCV 84.1  --  PLT 271  --    Basic Metabolic Panel:  Recent Labs Lab 07/08/16 1300 07/08/16 1333  NA 132* 134*  K 4.3 4.2  CL 99* 96*  CO2 23  --   GLUCOSE 434* 452*  BUN 11 13  CREATININE 1.03 0.90  CALCIUM 9.6  --    GFR: CrCl cannot be calculated (Unknown ideal weight.). Liver Function Tests:  Recent Labs Lab 07/08/16 1300  AST 21  ALT 11*  ALKPHOS 79  BILITOT 1.1  PROT 7.3  ALBUMIN 4.1   No results for input(s): LIPASE, AMYLASE in the last 168 hours. No results for input(s): AMMONIA in the last 168 hours. Coagulation Profile: No results for input(s): INR, PROTIME in the last 168 hours. Cardiac Enzymes: No results for input(s): CKTOTAL, CKMB, CKMBINDEX, TROPONINI in the last 168 hours. BNP (last 3 results) No results for input(s): PROBNP in the last 8760 hours. HbA1C: No results for input(s): HGBA1C in the last 72 hours. CBG: No results for input(s): GLUCAP in the last 168 hours. Lipid Profile: No results for input(s): CHOL, HDL, LDLCALC, TRIG, CHOLHDL, LDLDIRECT in the last 72 hours. Thyroid Function Tests: No results for input(s): TSH, T4TOTAL, FREET4, T3FREE, THYROIDAB in the last 72 hours. Anemia Panel: No results for input(s): VITAMINB12, FOLATE, FERRITIN, TIBC, IRON, RETICCTPCT in the last 72 hours. Urine analysis:    Component Value Date/Time   COLORURINE STRAW* 04/01/2015 2038   COLORURINE Straw 11/04/2013 1144   APPEARANCEUR CLOUDY* 04/01/2015 2038   APPEARANCEUR Clear 11/04/2013 1144   LABSPEC 1.008 04/01/2015 2038   LABSPEC 1.014 11/04/2013 1144    PHURINE 5.5 04/01/2015 2038   PHURINE 5.0 11/04/2013 1144   GLUCOSEU 500* 04/01/2015 2038   GLUCOSEU >=500 11/04/2013 1144   HGBUR SMALL* 04/01/2015 2038   HGBUR Negative 11/04/2013 1144   HGBUR negative 05/30/2010 0930   BILIRUBINUR NEGATIVE 04/01/2015 2038   BILIRUBINUR Negative 11/04/2013 [place] 04/01/2015 2038   KETONESUR Negative 11/04/2013 1144   PROTEINUR 100* 04/01/2015 2038   PROTEINUR Negative 11/04/2013 1144   UROBILINOGEN 1.0 04/01/2015 2038   NITRITE NEGATIVE 04/01/2015 2038   NITRITE Negative 11/04/2013 1144   LEUKOCYTESUR NEGATIVE 04/01/2015 2038   LEUKOCYTESUR Negative 11/04/2013 1144   Sepsis Labs: @LABRCNTIP (procalcitonin:4,lacticidven:4) )No results found for this or any previous visit (from the past 240 hour(s)).     UA ordered  Lab Results  Component Value Date   HGBA1C 9.8* 04/03/2015    CrCl cannot be calculated (Unknown ideal weight.).  BNP (last 3 results) No results for input(s): PROBNP in the last 8760 hours.   ECG REPORT  Independently reviewed Rate: 96  Rhythm: NSR ST&T Change: No acute ischemic changes   QTC 459  There were no vitals filed for this visit.   Cultures:    Component Value Date/Time   SDES SPUTUM 04/02/2015 0903   SDES SPUTUM 04/02/2015 0903   SPECREQUEST NONE 04/02/2015 0903   SPECREQUEST NONE 04/02/2015 0903   CULT  04/02/2015 0903    NORMAL OROPHARYNGEAL FLORA Performed at [facility] 04/02/2015 FINAL 04/02/2015 0903   REPTSTATUS 04/04/2015 FINAL 04/02/2015 0903     Radiological Exams on Admission: Ct Head Wo Contrast  07/08/2016  CLINICAL DATA:  Left arm weakness numbness and tingling. Transient gait and speech disturbances. EXAM: CT HEAD WITHOUT CONTRAST TECHNIQUE: Contiguous axial images were obtained from the base of the skull through the vertex without intravenous contrast. COMPARISON:  None. FINDINGS: Brain: No evidence of  acute hemorrhage, extra-axial  collection, ventriculomegaly, or mass effect. There are areas of hypoattenuation within the right basal ganglia and the subcortical white matter of the right frontal and parietal lobes. A hypoattenuated focus is also seen within the right cerebellar peduncle. Vascular: No hyperdense vessel or unexpected calcification. Skull: Negative for fracture or focal lesion. Sinuses/Orbits: There is moderate to severe polypoid mucosal thickening of bilateral maxillary sinuses, and milder mucosal thickening of the sphenoid sinuses. Other: None. IMPRESSION: Abnormal areas of hypoattenuation within the subcortical white matter of the right frontal and parietal lobes, and right basal ganglia, with appearance most consistent by CT with age-indeterminate infarcts. Additional focus of hypoattenuation within the right cerebellar peduncle with indeterminate appearance. Chronic sinusitis. These results were called by telephone at the time of interpretation on 07/08/2016 at 6:08 pm to Dr. Zenovia Jarred , who verbally acknowledged these results. Electronically Signed   By: Fidela Salisbury M.D.   On: 07/08/2016 18:13    Chart has been reviewed    Assessment/Plan  50 y.o. male with medical history significant of buying systolic diastolic heart failure, HTN, hypercholesterolemia, diabetes, colon cancer status post resection here with TIA vs CVA  Present on Admission:  . Stroke-like symptom /. TIA (transient ischemic attack) -  - will admit based on TIA/CVA protocol, await results of MRA/MRI, Carotid Doppler and Echo, obtain cardiac enzymes,  ECG,   Lipid panel, TSH. Order PT/OT evaluation. Will make sure patient is on antiplatelet agent.   Neurology consulted.     . Malignant neoplasm of colon ([place]) - in remission . HYPERTENSION, BENIGN ESSENTIAL permissive hypertension at night restart home medications tomorrow  . Chronic combined systolic (congestive) and diastolic (congestive) heart failure (HCC) - noted hyponatremia  and dry mucous membranes. We'll hold Lasix for tonight  Would restart in the next few days once fluid resuscitated DM 2  - order sliding scale hold metformin continue glipizide Other plan as per orders.  DVT prophylaxis:  SCD    Code Status:  FULL CODE  as per patient    Family Communication:   Family not  at  Bedside    Disposition Plan:   To home once workup is complete and patient is stable   Consults called:  Neurology [place]  Admission status:  obs    Level of care     tele            I have spent a total of 57 min on this admission   Roselle Norton 07/08/2016, 8:37 PM    [facility]  Pager [phone]   after 2 AM please page floor coverage PA If 7AM-7PM, please contact the day team taking care of the patient  [email or web address]  Password [email or web address]

## 2016-07-08 NOTE — ED Notes (Signed)
Dr. Doutova at bedside.  

## 2016-07-08 NOTE — ED Notes (Signed)
Pt. Stated, my left arm feels weak and it feels like I can't grip. It started yesterday, no other symptoms.

## 2016-07-08 NOTE — ED Provider Notes (Signed)
CSN: [id number]     Arrival date & time 07/08/16  1230 History   First MD Initiated Contact with Patient 07/08/16 1559     Chief Complaint  Patient presents with  . Extremity Weakness     (Consider location/radiation/quality/duration/timing/severity/associated sxs/prior Treatment) HPI   Patient is a 50 year old male with history of diabetes, congestive heart failure, hypertension, hypercholesteremia, currently using smokeless tobacco. He is presenting today with sputtering symptoms of left arm weakness numbness and tingling. Patient reports it started yesterday after lunch he felt like he couldn't use his left hand properly. Then when he got up to the bathroom overnight patient was stumbling and unable to walk with a proper gait according to wife. She also reports that she had some trouble understanding what he was saying. She tried to get him to the hospital then but he refused. Today when he went to work he was unable to use his tools with his left hand and came here to the emergency department. His symptoms have since resolved resolved. They have been intermittent in nature.  Patient is on a baby aspirin daily.  Past Medical History  Diagnosis Date  . Diabetes mellitus   . CHF (congestive heart failure) ([place])   . Colon cancer ([place])   . Hypercholesteremia   . HTN (hypertension)    Past Surgical History  Procedure Laterality Date  . Cholecystectomy    . Appendectomy    . Colon surgery     Family History  Problem Relation Age of Onset  . Diabetes    . Cancer    . Hypertension    . Hypertension Mother   . Diabetes Mother   . Cancer Mother   . Hypertension Father    Social History  Substance Use Topics  . Smoking status: Never Smoker   . Smokeless tobacco: None  . Alcohol Use: No    Review of Systems  Constitutional: Negative for fever and activity change.  HENT: Negative for ear pain and sore throat.   Eyes: Negative for pain and visual disturbance.  Respiratory:  Negative for cough and shortness of breath.   Cardiovascular: Negative for chest pain.  Gastrointestinal: Negative for nausea, vomiting and diarrhea.  Genitourinary: Negative for dysuria and frequency.  Musculoskeletal: Negative for back pain and gait problem.  Skin: Negative for rash.  Allergic/Immunologic: Negative for immunocompromised state.  Neurological: Negative for dizziness and light-headedness.  Psychiatric/Behavioral: Negative for behavioral problems and agitation.  All other systems reviewed and are negative.     Allergies  Review of patient's allergies indicates no known allergies.  Home Medications   Prior to Admission medications   Medication Sig Start Date End Date Taking? Authorizing Provider  aspirin 81 MG EC tablet Take 1 tablet (81 mg total) by mouth daily. 06/10/14  Yes Jolaine Artist, MD  carvedilol (COREG) 25 MG tablet Take 1 tablet (25 mg total) by mouth 2 (two) times daily with a meal. 11/16/15  Yes Jolaine Artist, MD  furosemide (LASIX) 40 MG tablet Take 1 tablet (40 mg total) by mouth daily. 11/16/15  Yes Jolaine Artist, MD  glipiZIDE (GLUCOTROL) 5 MG tablet Take 1 tablet (5 mg total) by mouth 2 (two) times daily before a meal. 04/14/15  Yes Deepak Advani, MD  lisinopril (PRINIVIL,ZESTRIL) 20 MG tablet Take 1 tablet (20 mg total) by mouth 2 (two) times daily. 11/16/15  Yes Jolaine Artist, MD  metFORMIN (GLUCOPHAGE) 500 MG tablet Take 1 tablet (500 mg total) by mouth 2 (  two) times daily with a meal. 04/14/15  Yes Deepak Advani, MD  spironolactone (ALDACTONE) 25 MG tablet Take 1 tablet (25 mg total) by mouth daily. 11/16/15  Yes Shaune Pascal Bensimhon, MD   BP 146/75 mmHg  Pulse 81  Temp(Src) 97.5 F (36.4 C) (Oral)  Resp 18  Ht 6\' 1"  (1.854 m)  Wt 210 lb 12.8 oz (95.618 kg)  BMI 27.82 kg/m2  SpO2 97% Physical Exam  Constitutional: He is oriented to person, place, and time. He appears well-nourished.  HENT:  Head: Normocephalic.   Mouth/Throat: Oropharynx is clear and moist.  Eyes: Conjunctivae are normal.  Right-sided face mildly different than left-sided face, chronic in nature according to wife and family.  Neck: No tracheal deviation present.  Cardiovascular: Normal rate.   Pulmonary/Chest: Effort normal. No stridor. No respiratory distress.  Abdominal: Soft. There is no tenderness. There is no guarding.  Musculoskeletal: Normal range of motion. He exhibits no edema.  Neurological: He is oriented to person, place, and time. No cranial nerve deficit.  Equal strength bilaterally upper and lower extremities negative pronator drift. Normal sensation bilaterally. Speech comprehensible, no slurring. Facial nerve tested and appears grossly normal. Alert and oriented 3.  Skin: Skin is warm and dry. No rash noted. He is not diaphoretic.  Psychiatric: He has a normal mood and affect. His behavior is normal.  Nursing note and vitals reviewed.   ED Course  Procedures (including critical care time) Labs Review Labs Reviewed  COMPREHENSIVE METABOLIC PANEL - Abnormal; Notable for the following:    Sodium 132 (*)    Chloride 99 (*)    Glucose, Bld 434 (*)    ALT 11 (*)    All other components within normal limits  TROPONIN I - Abnormal; Notable for the following:    Troponin I 0.03 (*)    All other components within normal limits  URINALYSIS, DIPSTICK ONLY - Abnormal; Notable for the following:    Glucose, UA >1000 (*)    Hgb urine dipstick TRACE (*)    Protein, ur 100 (*)    All other components within normal limits  GLUCOSE, CAPILLARY - Abnormal; Notable for the following:    Glucose-Capillary 367 (*)    All other components within normal limits  I-STAT CHEM 8, ED - Abnormal; Notable for the following:    Sodium 134 (*)    Chloride 96 (*)    Glucose, Bld 452 (*)    All other components within normal limits  CBC  DIFFERENTIAL  CREATININE, URINE, RANDOM  SODIUM, URINE, RANDOM  OSMOLALITY, URINE  HEMOGLOBIN  A1C  LIPID PANEL  I-STAT TROPOININ, ED  CBG MONITORING, ED    Imaging Review Dg Chest 2 View  07/08/2016  CLINICAL DATA:  TIA today. EXAM: CHEST  2 VIEW COMPARISON:  04/01/2015 FINDINGS: Cardiomediastinal silhouette is normal. Mediastinal contours appear intact. There is prominence of the right hilum and new eventration of the right hemidiaphragm. There is no evidence of focal airspace consolidation, pleural effusion or pneumothorax. Osseous structures are without acute abnormality. Soft tissues are grossly normal. IMPRESSION: Prominence of the right hilum, which may represent overlapping vascular structures, however is noted to be asymmetric from the left and therefore lymphadenopathy cannot be excluded. New eventration of the right hemidiaphragm. Electronically Signed   By: Fidela Salisbury M.D.   On: 07/08/2016 22:22   Ct Head Wo Contrast  07/08/2016  CLINICAL DATA:  Left arm weakness numbness and tingling. Transient gait and speech disturbances. EXAM: CT HEAD  WITHOUT CONTRAST TECHNIQUE: Contiguous axial images were obtained from the base of the skull through the vertex without intravenous contrast. COMPARISON:  None. FINDINGS: Brain: No evidence of acute hemorrhage, extra-axial collection, ventriculomegaly, or mass effect. There are areas of hypoattenuation within the right basal ganglia and the subcortical white matter of the right frontal and parietal lobes. A hypoattenuated focus is also seen within the right cerebellar peduncle. Vascular: No hyperdense vessel or unexpected calcification. Skull: Negative for fracture or focal lesion. Sinuses/Orbits: There is moderate to severe polypoid mucosal thickening of bilateral maxillary sinuses, and milder mucosal thickening of the sphenoid sinuses. Other: None. IMPRESSION: Abnormal areas of hypoattenuation within the subcortical white matter of the right frontal and parietal lobes, and right basal ganglia, with appearance most consistent by CT with  age-indeterminate infarcts. Additional focus of hypoattenuation within the right cerebellar peduncle with indeterminate appearance. Chronic sinusitis. These results were called by telephone at the time of interpretation on 07/08/2016 at 6:08 pm to Dr. Zenovia Jarred , who verbally acknowledged these results. Electronically Signed   By: Fidela Salisbury M.D.   On: 07/08/2016 18:13   Mr Virgel Paling Wo Contrast  07/08/2016  CLINICAL DATA:  50 year old diabetic hypertensive male with acute onset of arm clumsiness yesterday. Residual mild weakness and numbness left hand. Subsequent encounter. EXAM: MRI HEAD WITHOUT CONTRAST MRA HEAD WITHOUT CONTRAST TECHNIQUE: Multiplanar, multiecho pulse sequences of the brain and surrounding structures were obtained without intravenous contrast. Angiographic images of the head were obtained using MRA technique without contrast. COMPARISON:  07/08/2016 CT. FINDINGS: MRI HEAD FINDINGS Several acute infarcts involving frontal and parietal lobes bilaterally. Majority of these are small with largest acute infarct posterior left frontal lobe measuring up to 1.4 cm. No associated hemorrhage with these acute infarcts. Remote right lenticular nucleus, caudate, corona radiata and centrum semiovale infarcts with blood-stained encephalomalacia. Subsequent mild dilation of the right lateral ventricle. Abnormal appearance of the right internal carotid artery as discussed below. Moderate chronic microvascular changes. Global atrophy without hydrocephalus. No acute orbital abnormality. No intracranial mass lesion noted on this unenhanced exam. Paranasal sinus mucosal thickening most notable maxillary sinuses measuring up to 1 cm on right and complex and measuring up to 1 cm on the left. Small mega cisterna magna incidentally noted. 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 Minimal signal within portion of the right internal cavernous/supraclinoid segment may represent poor  collateral flow. No flow seen within the right carotid terminus or right middle cerebral artery. Moderate narrowing left internal carotid artery cavernous segment junction with the supraclinoid segment. Mild to moderate narrowing proximal M1 segment left middle cerebral artery with early branching just beyond this region. Left anterior cerebral artery with collateral flow to the A2 segment of the right anterior cerebral artery. Left vertebral artery is dominant. Attenuated right vertebral artery. Irregularity and areas of narrowing involving portions of the posterior inferior cerebellar artery and anterior inferior cerebellar artery bilaterally. No high-grade stenosis of the basilar artery. Mild irregularity superior cerebellar artery greater on left. Mild moderate narrowing of portions of the posterior cerebral artery mid to distal branches. IMPRESSION: MRI HEAD Several acute infarcts involving frontal and parietal lobes bilaterally. Majority of these are small with largest acute infarct posterior left frontal lobe measuring up to 1.4 cm. No associated hemorrhage with these acute infarcts. Remote right lenticular nucleus, caudate, corona radiata and centrum semiovale infarcts with blood-stained encephalomalacia. Subsequent mild dilation of the right lateral ventricle. Abnormal appearance of the right internal carotid artery as  discussed below. Moderate chronic microvascular changes. Global atrophy without hydrocephalus. Paranasal sinus mucosal thickening most notable maxillary sinuses measuring up to 1 cm on right and complex and measuring up to 1 cm on the left. 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 Minimal signal within portion of the right internal cavernous/supraclinoid segment may represent poor collateral flow. No flow seen within the right carotid terminus or right middle cerebral artery. Moderate narrowing left internal carotid artery cavernous segment junction with the supraclinoid segment.  Mild to moderate narrowing proximal M1 segment left middle cerebral artery with early branching just beyond this region. Left anterior cerebral artery with collateral flow to the A2 segment of the right anterior cerebral artery. Left vertebral artery is dominant. Attenuated right vertebral artery. Irregularity and areas of narrowing involving portions of the posterior inferior cerebellar artery and anterior inferior cerebellar artery bilaterally. No high-grade stenosis of the basilar artery. Mild irregularity superior cerebellar artery greater on left. Mild to moderate narrowing of portions of the posterior cerebral artery mid to distal branches. Electronically Signed   By: Genia Del M.D.   On: 07/08/2016 21:03   Mr Brain Wo Contrast  07/08/2016  CLINICAL DATA:  50 year old diabetic hypertensive male with acute onset of arm clumsiness yesterday. Residual mild weakness and numbness left hand. Subsequent encounter. EXAM: MRI HEAD WITHOUT CONTRAST MRA HEAD WITHOUT CONTRAST TECHNIQUE: Multiplanar, multiecho pulse sequences of the brain and surrounding structures were obtained without intravenous contrast. Angiographic images of the head were obtained using MRA technique without contrast. COMPARISON:  07/08/2016 CT. FINDINGS: MRI HEAD FINDINGS Several acute infarcts involving frontal and parietal lobes bilaterally. Majority of these are small with largest acute infarct posterior left frontal lobe measuring up to 1.4 cm. No associated hemorrhage with these acute infarcts. Remote right lenticular nucleus, caudate, corona radiata and centrum semiovale infarcts with blood-stained encephalomalacia. Subsequent mild dilation of the right lateral ventricle. Abnormal appearance of the right internal carotid artery as discussed below. Moderate chronic microvascular changes. Global atrophy without hydrocephalus. No acute orbital abnormality. No intracranial mass lesion noted on this unenhanced exam. Paranasal sinus mucosal  thickening most notable maxillary sinuses measuring up to 1 cm on right and complex and measuring up to 1 cm on the left. Small mega cisterna magna incidentally noted. 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 Minimal signal within portion of the right internal cavernous/supraclinoid segment may represent poor collateral flow. No flow seen within the right carotid terminus or right middle cerebral artery. Moderate narrowing left internal carotid artery cavernous segment junction with the supraclinoid segment. Mild to moderate narrowing proximal M1 segment left middle cerebral artery with early branching just beyond this region. Left anterior cerebral artery with collateral flow to the A2 segment of the right anterior cerebral artery. Left vertebral artery is dominant. Attenuated right vertebral artery. Irregularity and areas of narrowing involving portions of the posterior inferior cerebellar artery and anterior inferior cerebellar artery bilaterally. No high-grade stenosis of the basilar artery. Mild irregularity superior cerebellar artery greater on left. Mild moderate narrowing of portions of the posterior cerebral artery mid to distal branches. IMPRESSION: MRI HEAD Several acute infarcts involving frontal and parietal lobes bilaterally. Majority of these are small with largest acute infarct posterior left frontal lobe measuring up to 1.4 cm. No associated hemorrhage with these acute infarcts. Remote right lenticular nucleus, caudate, corona radiata and centrum semiovale infarcts with blood-stained encephalomalacia. Subsequent mild dilation of the right lateral ventricle. Abnormal appearance of the right internal carotid artery  as discussed below. Moderate chronic microvascular changes. Global atrophy without hydrocephalus. Paranasal sinus mucosal thickening most notable maxillary sinuses measuring up to 1 cm on right and complex and measuring up to 1 cm on the left. MRA HEAD Right internal  carotid artery is occluded. Minimal signal within portion of the right internal cavernous/supraclinoid segment may represent poor collateral flow. No flow seen within the right carotid terminus or right middle cerebral artery. Moderate narrowing left internal carotid artery cavernous segment junction with the supraclinoid segment. Mild to moderate narrowing proximal M1 segment left middle cerebral artery with early branching just beyond this region. Left anterior cerebral artery with collateral flow to the A2 segment of the right anterior cerebral artery. Left vertebral artery is dominant. Attenuated right vertebral artery. Irregularity and areas of narrowing involving portions of the posterior inferior cerebellar artery and anterior inferior cerebellar artery bilaterally. No high-grade stenosis of the basilar artery. Mild irregularity superior cerebellar artery greater on left. Mild to moderate narrowing of portions of the posterior cerebral artery mid to distal branches. Electronically Signed   By: Genia Del M.D.   On: 07/08/2016 21:03   I have personally reviewed and evaluated these images and lab results as part of my medical decision-making.   EKG Interpretation   Date/Time:  Monday July 08 2016 12:46:57 EDT Ventricular Rate:  96 PR Interval:  144 QRS Duration: 102 QT Interval:  364 QTC Calculation: 459 R Axis:   9 Text Interpretation:  Normal sinus rhythm Cannot rule out Anterior infarct  , age undetermined T wave abnormality, consider inferior ischemia Abnormal  ECG No significant change since last tracing Confirmed by Gerald Leitz ([id number]) on 07/08/2016 5:20:18 PM      MDM   Final diagnoses:  Cerebrovascular accident (CVA) due to occlusion of precerebral artery [facility])   Patient is a 50 year old male presenting with intermittent left upper extremity weakness. Patient has had stuttering symptoms over the last 24 hours. I'm concerned this is stuttering TIA. Will get CAT scan,  stroke workup.  11:11 PM Discussed with neurology. They will follow workup as an inpatient.  CT positive. ASA given. Will admit for stroke.   Courteney Julio Alm, MD 07/08/16 2311

## 2016-07-09 ENCOUNTER — Encounter (HOSPITAL_COMMUNITY): Payer: Self-pay | Admitting: Radiology

## 2016-07-09 ENCOUNTER — Observation Stay (HOSPITAL_COMMUNITY): Payer: MEDICAID

## 2016-07-09 ENCOUNTER — Observation Stay (HOSPITAL_COMMUNITY): Payer: Self-pay

## 2016-07-09 ENCOUNTER — Other Ambulatory Visit (HOSPITAL_COMMUNITY): Payer: Self-pay

## 2016-07-09 DIAGNOSIS — I6320 Cerebral infarction due to unspecified occlusion or stenosis of unspecified precerebral arteries: Secondary | ICD-10-CM | POA: Insufficient documentation

## 2016-07-09 DIAGNOSIS — I6789 Other cerebrovascular disease: Secondary | ICD-10-CM

## 2016-07-09 DIAGNOSIS — R29898 Other symptoms and signs involving the musculoskeletal system: Secondary | ICD-10-CM

## 2016-07-09 DIAGNOSIS — E785 Hyperlipidemia, unspecified: Secondary | ICD-10-CM

## 2016-07-09 DIAGNOSIS — I639 Cerebral infarction, unspecified: Secondary | ICD-10-CM | POA: Diagnosis present

## 2016-07-09 DIAGNOSIS — Z794 Long term (current) use of insulin: Secondary | ICD-10-CM

## 2016-07-09 LAB — ECHOCARDIOGRAM COMPLETE
CHL CUP DOP CALC LVOT VTI: 20.2 cm
CHL CUP MV DEC (S): 169
CHL CUP STROKE VOLUME: 68 mL
E decel time: 169 msec
E/e' ratio: 11.81
FS: 13 % — AB (ref 28–44)
Height: 73 in
IV/PV OW: 1.07
LA ID, A-P, ES: 40 mm
LA diam end sys: 40 mm
LA diam index: 1.79 cm/m2
LA vol A4C: 77.3 ml
LAVOL: 71.7 mL
LAVOLIN: 32.1 mL/m2
LDCA: 4.15 cm2
LV E/e' medial: 11.81
LV E/e'average: 11.81
LV TDI E'LATERAL: 6.57
LV sys vol index: 34 mL/m2
LV sys vol: 76 mL — AB (ref 21–61)
LVDIAVOL: 143 mL (ref 62–150)
LVDIAVOLIN: 64 mL/m2
LVELAT: 6.57 cm/s
LVOT peak grad rest: 3 mmHg
LVOTD: 23 mm
LVOTPV: 82 cm/s
LVOTSV: 84 mL
MV Peak grad: 2 mmHg
MV pk A vel: 74.2 m/s
MV pk E vel: 77.6 m/s
PW: 14 mm — AB (ref 0.6–1.1)
Simpson's disk: 47
TAPSE: 14.6 mm
TDI e' medial: 5.03
WEIGHTICAEL: 3372.8 [oz_av]

## 2016-07-09 LAB — LIPID PANEL
Cholesterol: 185 mg/dL (ref 0–200)
HDL: 24 mg/dL — AB (ref >40)
LDL Cholesterol: UNDETERMINED mg/dL (ref 0–99)
Total CHOL/HDL Ratio: 7.7 RATIO
Triglycerides: 634 mg/dL — ABNORMAL HIGH (ref <150)
VLDL: UNDETERMINED mg/dL (ref 0–40)

## 2016-07-09 LAB — GLUCOSE, CAPILLARY
GLUCOSE-CAPILLARY: 213 mg/dL — AB (ref 65–99)
GLUCOSE-CAPILLARY: 137 mg/dL — AB (ref 65–99)
Glucose-Capillary: 259 mg/dL — ABNORMAL HIGH (ref 65–99)
Glucose-Capillary: 135 mg/dL — ABNORMAL HIGH (ref 65–99)

## 2016-07-09 LAB — HEMOGLOBIN A1C
HEMOGLOBIN A1C: 11 % — AB (ref 4.8–5.6)
MEAN PLASMA GLUCOSE: 269 mg/dL

## 2016-07-09 MED ORDER — INSULIN ASPART PROT & ASPART (70-30 MIX) 100 UNIT/ML ~~LOC~~ SUSP
5.0000 [IU] | Freq: Two times a day (BID) | SUBCUTANEOUS | Status: DC
  Administered 2016-07-09 – 2016-07-10 (×2): 5 [IU] via SUBCUTANEOUS
  Filled 2016-07-09: qty 10

## 2016-07-09 MED ORDER — ATORVASTATIN CALCIUM 40 MG PO TABS
40.0000 mg | ORAL_TABLET | Freq: Every day | ORAL | Status: DC
  Administered 2016-07-09: 40 mg via ORAL
  Filled 2016-07-09: qty 1

## 2016-07-09 MED ORDER — CARVEDILOL 12.5 MG PO TABS
12.5000 mg | ORAL_TABLET | Freq: Two times a day (BID) | ORAL | Status: DC
  Administered 2016-07-09 – 2016-07-10 (×2): 12.5 mg via ORAL
  Filled 2016-07-09 (×2): qty 1

## 2016-07-09 MED ORDER — CLOPIDOGREL BISULFATE 75 MG PO TABS
75.0000 mg | ORAL_TABLET | Freq: Every day | ORAL | Status: DC
  Administered 2016-07-09 – 2016-07-10 (×2): 75 mg via ORAL
  Filled 2016-07-09 (×2): qty 1

## 2016-07-09 MED ORDER — IOPAMIDOL (ISOVUE-370) INJECTION 76%
INTRAVENOUS | Status: AC
  Administered 2016-07-09: 50 mL
  Filled 2016-07-09: qty 50

## 2016-07-09 MED ORDER — FENOFIBRATE 160 MG PO TABS
160.0000 mg | ORAL_TABLET | Freq: Every day | ORAL | Status: DC
  Administered 2016-07-09 – 2016-07-10 (×2): 160 mg via ORAL
  Filled 2016-07-09 (×2): qty 1

## 2016-07-09 NOTE — Progress Notes (Signed)
Nutrition Brief Note  Patient identified on the Malnutrition Screening Tool (MST) Report  Wt Readings from Last 15 Encounters:  07/08/16 210 lb 12.8 oz (95.618 kg)  11/16/15 242 lb 8 oz (109.997 kg)  06/10/15 240 lb (108.863 kg)  06/10/15 240 lb (108.863 kg)  04/14/15 225 lb (102.059 kg)  04/04/15 222 lb 12.8 oz (101.061 kg)  04/29/14 253 lb (114.76 kg)  01/24/14 253 lb (114.76 kg)  10/22/13 243 lb 8 oz (110.451 kg)  07/15/13 248 lb 6.4 oz (112.674 kg)  03/30/13 245 lb 1.9 oz (111.186 kg)  03/17/13 263 lb 4 oz (119.409 kg)  12/24/12 246 lb 12 oz (111.925 kg)  11/25/12 238 lb 8 oz (108.183 kg)  11/11/12 235 lb 12.8 oz (106.958 kg)    Body mass index is 27.82 kg/(m^2). Patient meets criteria for Overweight based on current BMI.   Current diet order is Heart Healthy/Carb Modified, patient is consuming approximately 100% of meals at this time. Pt states that his appetite has been good and he has been eating well; he relates weight loss in the past 8 months to elevated glucose. He is agreeable to diabetic/heart healthy diet education. Labs and medications reviewed.   RD provided "Type 2 Diabetes Nutrition Therapy" handout from the Academy of [employer]. Reviewed pt's diet recall. Breakfast: nothing Lunch: Big Mac, french fries, diet soda Dinner: Pasta, chicken, and green beans or Hot Dogs  Discussed different food groups and their effects on blood sugar, emphasizing carbohydrate-containing foods. Provided list of carbohydrates and recommended serving sizes of common foods. Discussed importance of controlled and consistent carbohydrate intake throughout the day. Provided examples of ways to balance meals/snacks and encouraged intake of high-fiber, whole grain complex carbohydrates. Emphasized the importance of a heart healthy/low sodium diet rich in vegetables and heart healthy fats. Pt states that he does not add any salt to his food. Provided "20 Ways to Enjoy More Fruits and  Vegetables" and "Smart Snacking for Adults and Teens", and "5-day 1800 Calorie Menus" handouts from the [employer] of [employer] Dietetics. Teach back method used.  Expect good compliance. Pt reports that his children are obese too and also need to make changes. No further nutrition interventions warranted at this time. RD contact information provided. If additional nutrition issues arise, please re-consult RD.  Scarlette Ar RD, LDN Inpatient Clinical Dietitian Pager: [phone] After Hours Pager: [phone]

## 2016-07-09 NOTE — Progress Notes (Addendum)
Preliminary results by tech - Carotid Duplex Completed. Right ICA demonstrated abnormal pre occlusive waveforms consistent with known occlusive disease. Left ICA demonstrated normal patency with no evidence of stenosis. Bilateral vertebral arteries demonstrated antegrade flow. Oda Cogan, BS, RDMS, RVT

## 2016-07-09 NOTE — Progress Notes (Signed)
PROGRESS NOTE  Jared Hart [id number] DOB: Mar 05, 1966 DOA: 07/08/2016 PCP: Lorayne Marek, MD  Brief History:  50 year old male with history of systolic and diastolic CHF, hypertension, hyperlipidemia, diabetes mellitus, colon cancer presented with 2 day history of intermittent left approximate weakness, dysesthesias, gait instability, and dysarthria. This began on 07/07/2016 around lunch time. The patient went to work, but still has some difficulty with his left arm and hand. As a result, he presented to the emergency department for further workup. Neurology was consulted, and a full stroke workup was undertaken. The patient feels that his symptoms are mostly improved except for some mild weakness and clumsiness of his left hand.  Previous medical records reviewed and summarized  Assessment/Plan:  acute bilateral frontal and parietal stroke -Neurology Consult appreciated -PT/OT evaluation--no follow up needed -Speech therapy eval -CT brain--left frontal and parietal lobe, right basal ganglia infarct  -MRI-acute bilateral frontal and parietal lobe infarcts. -MRA brain--right internal carotid artery occlusion. Diffuse intracranial atherosclerosis involving large and small vessels  -Carotid Duplex--pending  -CT angio H&N-- -Echo--pending -LDL--unable to calculate secondary to elevated triglycerides  -HbA1C--Pending -Continue aspirin 325 g daily   chronic systolic and diastolic CHF -Daily weights -Restart furosemide -Restart carvedilol at a lower dose -01/24/2014 echo--EF 45-50 percent, diffuse HK, grade 1 DD -Cath 2011 with normal coronaries and EF 25% (felt to be hypertensive cardiomyopathy).  -personally reviewed EKG--sinus, nonspecfic T wave changes  Hypertension  -Allow for permissive hypertension  -Reduced dose carvedilol  -Discontinue Aldactone temporarily  Hyperlipidemia -Start Lipitor and fenofibrate  Diabetes mellitus type 2, uncontrolled -04/03/2015  hemoglobin A1c 9.8 -Repeat hemoglobin A1c -start 70/30 insulin--5 units bid and titrate based upon CBGs -NovoLog sliding scale -Discontinue metformin for now -d/c glipizide  Pseudohyponatremia -Secondary to hyperglycemia   Disposition Plan:   Home 7/12 or 7/13 Family Communication:   Wife updated at bedside   Consultants:  Neurology  Code Status:  FULL  DVT Prophylaxis:  [place] Lovenox   Procedures: As Listed in Progress Note Above  Antibiotics: None    Subjective: Patient denies fevers, chills, headache, chest pain, dyspnea, nausea, vomiting, diarrhea, abdominal pain, dysuria, hematuria.  Left upper extremities numbness and weakness are improved.  Back to baseline.   Objective: Filed Vitals:   07/09/16 0300 07/09/16 0514 07/09/16 0715 07/09/16 0915  BP: 127/66 146/77 176/90 137/81  Pulse: 73 75 79 74  Temp: 98 F (36.7 C) 98.1 F (36.7 C) 97.7 F (36.5 C) 98.2 F (36.8 C)  TempSrc: Oral Oral Oral Oral  Resp: 18 18 16 20   Height:      Weight:      SpO2: 94% 96% 97% 96%    Intake/Output Summary (Last 24 hours) at 07/09/16 1103 Last data filed at 07/09/16 0835  Gross per 24 hour  Intake    480 ml  Output      0 ml  Net    480 ml   Weight change:  Exam:   General:  Pt is alert, follows commands appropriately, not in acute distress  HEENT: No icterus, No thrush, No neck mass, [place]/AT  Cardiovascular: RRR, S1/S2, no rubs, no gallops  Respiratory: CTA bilaterally, no wheezing, no crackles, no rhonchi  Abdomen: Soft/+BS, non tender, non distended, no guarding  Extremities: No edema, No lymphangitis, No petechiae, No rashes, no synovitis Neuro:  CN II-XII intact, strength 5/5 in RUE, RLE, strength 5/5 LUE, LLE; sensation intact bilateral; no dysmetria; babinski equivocal  Data Reviewed: I have personally reviewed following labs and imaging studies Basic Metabolic Panel:  Recent Labs Lab 07/08/16 1300 07/08/16 1333  NA 132* 134*  K 4.3 4.2  CL  99* 96*  CO2 23  --   GLUCOSE 434* 452*  BUN 11 13  CREATININE 1.03 0.90  CALCIUM 9.6  --    Liver Function Tests:  Recent Labs Lab 07/08/16 1300  AST 21  ALT 11*  ALKPHOS 79  BILITOT 1.1  PROT 7.3  ALBUMIN 4.1   No results for input(s): LIPASE, AMYLASE in the last 168 hours. No results for input(s): AMMONIA in the last 168 hours. Coagulation Profile: No results for input(s): INR, PROTIME in the last 168 hours. CBC:  Recent Labs Lab 07/08/16 1300 07/08/16 1333  WBC 9.1  --   NEUTROABS 6.6  --   HGB 13.5 15.3  HCT 39.0 45.0  MCV 84.1  --   PLT 271  --    Cardiac Enzymes:  Recent Labs Lab 07/08/16 2058  TROPONINI 0.03*   BNP: Invalid input(s): POCBNP CBG:  Recent Labs Lab 07/08/16 2218 07/09/16 0611  GLUCAP 367* 259*   HbA1C: No results for input(s): HGBA1C in the last 72 hours. Urine analysis:    Component Value Date/Time   COLORURINE YELLOW 07/08/2016 2217   COLORURINE Straw 11/04/2013 1144   APPEARANCEUR CLEAR 07/08/2016 2217   APPEARANCEUR Clear 11/04/2013 1144   LABSPEC 1.030 07/08/2016 2217   LABSPEC 1.014 11/04/2013 1144   PHURINE 6.0 07/08/2016 2217   PHURINE 5.0 11/04/2013 1144   GLUCOSEU >1000* 07/08/2016 2217   GLUCOSEU >=500 11/04/2013 1144   HGBUR TRACE* 07/08/2016 2217   HGBUR Negative 11/04/2013 1144   HGBUR negative 05/30/2010 0930   BILIRUBINUR NEGATIVE 07/08/2016 2217   BILIRUBINUR Negative 11/04/2013 [place] 07/08/2016 2217   KETONESUR Negative 11/04/2013 1144   PROTEINUR 100* 07/08/2016 2217   PROTEINUR Negative 11/04/2013 1144   UROBILINOGEN 1.0 04/01/2015 2038   NITRITE NEGATIVE 07/08/2016 2217   NITRITE Negative 11/04/2013 1144   LEUKOCYTESUR NEGATIVE 07/08/2016 2217   LEUKOCYTESUR Negative 11/04/2013 1144   Sepsis Labs: @LABRCNTIP (procalcitonin:4,lacticidven:4) )No results found for this or any previous visit (from the past 240 hour(s)).   Scheduled Meds: . aspirin  300 mg Rectal Daily    Or  . aspirin  325 mg Oral Daily  . carvedilol  25 mg Oral BID WC  . insulin aspart  0-15 Units Subcutaneous TID WC  . insulin aspart  0-5 Units Subcutaneous QHS  . spironolactone  25 mg Oral Daily   Continuous Infusions: . sodium chloride 50 mL/hr at 07/08/16 2112    Procedures/Studies: Dg Chest 2 View  07/08/2016  CLINICAL DATA:  TIA today. EXAM: CHEST  2 VIEW COMPARISON:  04/01/2015 FINDINGS: Cardiomediastinal silhouette is normal. Mediastinal contours appear intact. There is prominence of the right hilum and new eventration of the right hemidiaphragm. There is no evidence of focal airspace consolidation, pleural effusion or pneumothorax. Osseous structures are without acute abnormality. Soft tissues are grossly normal. IMPRESSION: Prominence of the right hilum, which may represent overlapping vascular structures, however is noted to be asymmetric from the left and therefore lymphadenopathy cannot be excluded. New eventration of the right hemidiaphragm. Electronically Signed   By: Fidela Salisbury M.D.   On: 07/08/2016 22:22   Ct Head Wo Contrast  07/08/2016  CLINICAL DATA:  Left arm weakness numbness and tingling. Transient gait and speech disturbances. EXAM: CT HEAD WITHOUT CONTRAST TECHNIQUE: Contiguous axial  images were obtained from the base of the skull through the vertex without intravenous contrast. COMPARISON:  None. FINDINGS: Brain: No evidence of acute hemorrhage, extra-axial collection, ventriculomegaly, or mass effect. There are areas of hypoattenuation within the right basal ganglia and the subcortical white matter of the right frontal and parietal lobes. A hypoattenuated focus is also seen within the right cerebellar peduncle. Vascular: No hyperdense vessel or unexpected calcification. Skull: Negative for fracture or focal lesion. Sinuses/Orbits: There is moderate to severe polypoid mucosal thickening of bilateral maxillary sinuses, and milder mucosal thickening of the sphenoid  sinuses. Other: None. IMPRESSION: Abnormal areas of hypoattenuation within the subcortical white matter of the right frontal and parietal lobes, and right basal ganglia, with appearance most consistent by CT with age-indeterminate infarcts. Additional focus of hypoattenuation within the right cerebellar peduncle with indeterminate appearance. Chronic sinusitis. These results were called by telephone at the time of interpretation on 07/08/2016 at 6:08 pm to Dr. Zenovia Jarred , who verbally acknowledged these results. Electronically Signed   By: Fidela Salisbury M.D.   On: 07/08/2016 18:13   Mr Virgel Paling Wo Contrast  07/08/2016  CLINICAL DATA:  50 year old diabetic hypertensive male with acute onset of arm clumsiness yesterday. Residual mild weakness and numbness left hand. Subsequent encounter. EXAM: MRI HEAD WITHOUT CONTRAST MRA HEAD WITHOUT CONTRAST TECHNIQUE: Multiplanar, multiecho pulse sequences of the brain and surrounding structures were obtained without intravenous contrast. Angiographic images of the head were obtained using MRA technique without contrast. COMPARISON:  07/08/2016 CT. FINDINGS: MRI HEAD FINDINGS Several acute infarcts involving frontal and parietal lobes bilaterally. Majority of these are small with largest acute infarct posterior left frontal lobe measuring up to 1.4 cm. No associated hemorrhage with these acute infarcts. Remote right lenticular nucleus, caudate, corona radiata and centrum semiovale infarcts with blood-stained encephalomalacia. Subsequent mild dilation of the right lateral ventricle. Abnormal appearance of the right internal carotid artery as discussed below. Moderate chronic microvascular changes. Global atrophy without hydrocephalus. No acute orbital abnormality. No intracranial mass lesion noted on this unenhanced exam. Paranasal sinus mucosal thickening most notable maxillary sinuses measuring up to 1 cm on right and complex and measuring up to 1 cm on the left.  Small mega cisterna magna incidentally noted. 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 Minimal signal within portion of the right internal cavernous/supraclinoid segment may represent poor collateral flow. No flow seen within the right carotid terminus or right middle cerebral artery. Moderate narrowing left internal carotid artery cavernous segment junction with the supraclinoid segment. Mild to moderate narrowing proximal M1 segment left middle cerebral artery with early branching just beyond this region. Left anterior cerebral artery with collateral flow to the A2 segment of the right anterior cerebral artery. Left vertebral artery is dominant. Attenuated right vertebral artery. Irregularity and areas of narrowing involving portions of the posterior inferior cerebellar artery and anterior inferior cerebellar artery bilaterally. No high-grade stenosis of the basilar artery. Mild irregularity superior cerebellar artery greater on left. Mild moderate narrowing of portions of the posterior cerebral artery mid to distal branches. IMPRESSION: MRI HEAD Several acute infarcts involving frontal and parietal lobes bilaterally. Majority of these are small with largest acute infarct posterior left frontal lobe measuring up to 1.4 cm. No associated hemorrhage with these acute infarcts. Remote right lenticular nucleus, caudate, corona radiata and centrum semiovale infarcts with blood-stained encephalomalacia. Subsequent mild dilation of the right lateral ventricle. Abnormal appearance of the right internal carotid artery as discussed below. Moderate chronic microvascular  changes. Global atrophy without hydrocephalus. Paranasal sinus mucosal thickening most notable maxillary sinuses measuring up to 1 cm on right and complex and measuring up to 1 cm on the left. 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 Minimal signal within portion of the right internal cavernous/supraclinoid segment may represent  poor collateral flow. No flow seen within the right carotid terminus or right middle cerebral artery. Moderate narrowing left internal carotid artery cavernous segment junction with the supraclinoid segment. Mild to moderate narrowing proximal M1 segment left middle cerebral artery with early branching just beyond this region. Left anterior cerebral artery with collateral flow to the A2 segment of the right anterior cerebral artery. Left vertebral artery is dominant. Attenuated right vertebral artery. Irregularity and areas of narrowing involving portions of the posterior inferior cerebellar artery and anterior inferior cerebellar artery bilaterally. No high-grade stenosis of the basilar artery. Mild irregularity superior cerebellar artery greater on left. Mild to moderate narrowing of portions of the posterior cerebral artery mid to distal branches. Electronically Signed   By: Genia Del M.D.   On: 07/08/2016 21:03   Mr Brain Wo Contrast  07/08/2016  CLINICAL DATA:  50 year old diabetic hypertensive male with acute onset of arm clumsiness yesterday. Residual mild weakness and numbness left hand. Subsequent encounter. EXAM: MRI HEAD WITHOUT CONTRAST MRA HEAD WITHOUT CONTRAST TECHNIQUE: Multiplanar, multiecho pulse sequences of the brain and surrounding structures were obtained without intravenous contrast. Angiographic images of the head were obtained using MRA technique without contrast. COMPARISON:  07/08/2016 CT. FINDINGS: MRI HEAD FINDINGS Several acute infarcts involving frontal and parietal lobes bilaterally. Majority of these are small with largest acute infarct posterior left frontal lobe measuring up to 1.4 cm. No associated hemorrhage with these acute infarcts. Remote right lenticular nucleus, caudate, corona radiata and centrum semiovale infarcts with blood-stained encephalomalacia. Subsequent mild dilation of the right lateral ventricle. Abnormal appearance of the right internal carotid artery as  discussed below. Moderate chronic microvascular changes. Global atrophy without hydrocephalus. No acute orbital abnormality. No intracranial mass lesion noted on this unenhanced exam. Paranasal sinus mucosal thickening most notable maxillary sinuses measuring up to 1 cm on right and complex and measuring up to 1 cm on the left. Small mega cisterna magna incidentally noted. 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 Minimal signal within portion of the right internal cavernous/supraclinoid segment may represent poor collateral flow. No flow seen within the right carotid terminus or right middle cerebral artery. Moderate narrowing left internal carotid artery cavernous segment junction with the supraclinoid segment. Mild to moderate narrowing proximal M1 segment left middle cerebral artery with early branching just beyond this region. Left anterior cerebral artery with collateral flow to the A2 segment of the right anterior cerebral artery. Left vertebral artery is dominant. Attenuated right vertebral artery. Irregularity and areas of narrowing involving portions of the posterior inferior cerebellar artery and anterior inferior cerebellar artery bilaterally. No high-grade stenosis of the basilar artery. Mild irregularity superior cerebellar artery greater on left. Mild moderate narrowing of portions of the posterior cerebral artery mid to distal branches. IMPRESSION: MRI HEAD Several acute infarcts involving frontal and parietal lobes bilaterally. Majority of these are small with largest acute infarct posterior left frontal lobe measuring up to 1.4 cm. No associated hemorrhage with these acute infarcts. Remote right lenticular nucleus, caudate, corona radiata and centrum semiovale infarcts with blood-stained encephalomalacia. Subsequent mild dilation of the right lateral ventricle. Abnormal appearance of the right internal carotid artery as discussed below. Moderate chronic  microvascular changes.  Global atrophy without hydrocephalus. Paranasal sinus mucosal thickening most notable maxillary sinuses measuring up to 1 cm on right and complex and measuring up to 1 cm on the left. 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 Minimal signal within portion of the right internal cavernous/supraclinoid segment may represent poor collateral flow. No flow seen within the right carotid terminus or right middle cerebral artery. Moderate narrowing left internal carotid artery cavernous segment junction with the supraclinoid segment. Mild to moderate narrowing proximal M1 segment left middle cerebral artery with early branching just beyond this region. Left anterior cerebral artery with collateral flow to the A2 segment of the right anterior cerebral artery. Left vertebral artery is dominant. Attenuated right vertebral artery. Irregularity and areas of narrowing involving portions of the posterior inferior cerebellar artery and anterior inferior cerebellar artery bilaterally. No high-grade stenosis of the basilar artery. Mild irregularity superior cerebellar artery greater on left. Mild to moderate narrowing of portions of the posterior cerebral artery mid to distal branches. Electronically Signed   By: Genia Del M.D.   On: 07/08/2016 21:03    Lazaria Schaben, Sanjay, DO  [facility] Pager [phone]  If 7PM-7AM, please contact night-coverage [email or web address] Password [email or web address] 07/09/2016, 11:03 AM

## 2016-07-09 NOTE — Progress Notes (Signed)
  Echocardiogram 2D Echocardiogram has been performed.  Jared Hart 07/09/2016, 4:34 PM

## 2016-07-09 NOTE — Evaluation (Signed)
Physical Therapy Evaluation & [place] Patient Details Name: Jared Hart MRN: [id number] DOB: 1966-04-16 Today's Date: 07/09/2016   History of Present Illness  Jared Hart is a 50 y.o. male with medical history significant of combined systolic diastolic heart failure, HTN, hypercholesterolemia, diabetes, colon cancer status post resection. He was admitted to the hospital with intermittent symptoms of left arm weakness and numbness associated tingling, slurred speech, and abnormal gait.   Clinical Impression   Pt was admitted to the hospital with the above. Pt ambulated 200 ft and performed 5 stairs independently. He demonstrated [facility] strength for bilateral UE and LE, with bilateral sensation in tact. Pt states that all symptoms have resolved and appears back to baseline. Pt will no further acute skilled PT needs at this time. Discontinue PT services at this time. Please contact PT with any additional question.     Follow Up Recommendations No PT follow up    Equipment Recommendations  None recommended by PT    Recommendations for Other Services       Precautions / Restrictions Precautions Precautions: Fall Restrictions Weight Bearing Restrictions: No      Mobility  Bed Mobility               General bed mobility comments: Pt found sitting on EOB.  Transfers Overall transfer level: Independent Equipment used: None                Ambulation/Gait Ambulation/Gait assistance: Independent Ambulation Distance (Feet): 200 Feet Assistive device: None Gait Pattern/deviations: WFL(Within Functional Limits)   Gait velocity interpretation: at or above normal speed for age/gender    Stairs Stairs: Yes Stairs assistance: Independent Stair Management: One rail Right Number of Stairs: 3 (+2 x 2)    Wheelchair Mobility    Modified Rankin (Stroke Patients Only) Modified Rankin (Stroke Patients Only) Pre-Morbid Rankin Score: No symptoms Modified Rankin: No  symptoms     Balance Overall balance assessment: Independent                               Standardized Balance Assessment Standardized Balance Assessment : Dynamic Gait Index   Dynamic Gait Index Level Surface: Normal Change in Gait Speed: Normal Gait with Horizontal Head Turns: Normal Gait with Vertical Head Turns: Normal Gait and Pivot Turn: Normal Step Over Obstacle: Normal Step Around Obstacles: Normal Steps: Normal Total Score: 24       Pertinent Vitals/Pain Pain Assessment: No/denies pain    Home Living Family/patient expects to be discharged to:: Private residence Living Arrangements: Spouse/significant other;Children Available Help at Discharge: Family Type of Home: House Home Access: Stairs to enter Entrance Stairs-Rails: Right Entrance Stairs-Number of Steps: 4   Home Equipment: None      Prior Function Level of Independence: Independent               Hand Dominance        Extremity/Trunk Assessment   Upper Extremity Assessment: Overall WFL for tasks assessed           Lower Extremity Assessment: Overall WFL for tasks assessed         Communication   Communication: No difficulties  Cognition Arousal/Alertness: Awake/alert Behavior During Therapy: WFL for tasks assessed/performed Overall Cognitive Status: Within Functional Limits for tasks assessed                      General Comments General comments (skin integrity, edema,  etc.): Pt states he feels like he is back to baseline with no remaining symptoms.    Exercises        Assessment/Plan    PT Assessment Patent does not need any further PT services  PT Diagnosis Difficulty walking;Abnormality of gait;Generalized weakness   PT Problem List    PT Treatment Interventions     PT Goals (Current goals can be found in the Care Plan section) Acute Rehab PT Goals Patient Stated Goal: To go home. PT Goal Formulation: All assessment and education  complete, DC therapy    Frequency     Barriers to discharge        Co-evaluation               End of Session Equipment Utilized During Treatment: Gait belt Activity Tolerance: Patient tolerated treatment well Patient left: in bed;with family/visitor present Nurse Communication: Mobility status    Functional Assessment Tool Used: clinical judgement Functional Limitation: Mobility: Walking and moving around Mobility: Walking and Moving Around Current Status [phone]): 0 percent impaired, limited or restricted Mobility: Walking and Moving Around Goal Status [phone]): 0 percent impaired, limited or restricted Mobility: Walking and Moving Around Discharge Status [phone]): 0 percent impaired, limited or restricted    Time: [id number] PT Time Calculation (min) (ACUTE ONLY): 14 min   Charges:   PT Evaluation $PT Eval Low Complexity: 1 Procedure     PT G Codes:   PT G-Codes **NOT FOR INPATIENT CLASS** Functional Assessment Tool Used: clinical judgement Functional Limitation: Mobility: Walking and moving around Mobility: Walking and Moving Around Current Status [id number]): 0 percent impaired, limited or restricted Mobility: Walking and Moving Around Goal Status [id number]): 0 percent impaired, limited or restricted Mobility: Walking and Moving Around Discharge Status [phone]): 0 percent impaired, limited or restricted    Daymon Larsen 07/09/2016, 2:18 PM  Tawni Millers, SPT (student physical therapist) Acute Rehabilitation Services [phone]

## 2016-07-09 NOTE — Progress Notes (Signed)
Inpatient Diabetes Program Recommendations  AACE/ADA: New Consensus Statement on Inpatient Glycemic Control (2015)  Target Ranges:  Prepandial:   less than 140 mg/dL      Peak postprandial:   less than 180 mg/dL (1-2 hours)      Critically ill patients:  140 - 180 mg/dL   Results for Jared Hart, Jared Hart (MRN [id number]) as of 07/09/2016 10:42  Ref. Range 07/08/2016 22:18 07/09/2016 06:11  Glucose-Capillary Latest Ref Range: 65-99 mg/dL 367 (H) 259 (H)   Review of Glycemic Control  Diabetes history: DM2 Outpatient Diabetes medications: Glipizide 5 mg BID, Metformin 500 mg BID Current orders for Inpatient glycemic control: Novolog 0-15 units TID with meals, Novolog 0-5 units QHS  Inpatient Diabetes Program Recommendations: Insulin - Basal: Please consider ordering low dose basal insulin. Recommend starting with Levemir 10 units daily (based on 95.6 kg x 0.1 units). HgbA1C: A1C in process.  Thanks, Barnie Alderman, RN, MSN, CDE Diabetes Coordinator Inpatient Diabetes Program [phone] (Team Pager from [place] to [place]) [phone] ([facility] office) [phone] [facility] office) [phone] [facility] office)

## 2016-07-09 NOTE — Progress Notes (Signed)
Patient arrived to 5C15 from the ED at 2040 A/O X3. Oriented to room, equipment and all safety measures. MAEW. VSS. Telemetry initiated. Initial NIHSS 0. Will monitor closely overnight.

## 2016-07-09 NOTE — Progress Notes (Signed)
STROKE TEAM PROGRESS NOTE   HISTORY OF PRESENT ILLNESS (per record) Jared Hart is an 50 y.o. male with history of diabetes, CHF, hypertension presented with acute onset of arm clumsiness which started yesterday, 07/07/2016 (LKW, time unclear). Patient thinks his symptoms are mostly improved except very mild weakness/clumsiness and numbness in left hand. His symptoms started yesterday afternoon later in the night he felt some unbalanced gait which also improved. Upon awakening he thought his symptoms are mostly improved but while at work he kept on dropping stuff with left hand. Patient takes aspirin 81 mg daily. He chews tobacco. CT scan of the head did not show any acute abnormalities.Currently denies any chest pain, palpitation, fever, chills, headache, no change in vision bowel or urinary habits. Patient was not administered IV t-PA. He was admitted for further evaluation and treatment.   SUBJECTIVE (INTERVAL HISTORY) His wife is at the bedside.  He is lying in the bed. He recounts a history of L hand numbness about 1 month ago. Denies recent neck injury.  Overall he feels his condition is stable.    OBJECTIVE Temp:  [97.5 F (36.4 C)-98.3 F (36.8 C)] 98.2 F (36.8 C) (07/11 0915) Pulse Rate:  [73-98] 74 (07/11 0915) Cardiac Rhythm:  [-] Normal sinus rhythm (07/11 0700) Resp:  [12-24] 20 (07/11 0915) BP: (127-180)/(66-103) 137/81 mmHg (07/11 0915) SpO2:  [94 %-100 %] 96 % (07/11 0915) Weight:  [95.618 kg (210 lb 12.8 oz)] 95.618 kg (210 lb 12.8 oz) (07/10 2042)  CBC:   Recent Labs Lab 07/08/16 1300 07/08/16 1333  WBC 9.1  --   NEUTROABS 6.6  --   HGB 13.5 15.3  HCT 39.0 45.0  MCV 84.1  --   PLT 271  --     Basic Metabolic Panel:   Recent Labs Lab 07/08/16 1300 07/08/16 1333  NA 132* 134*  K 4.3 4.2  CL 99* 96*  CO2 23  --   GLUCOSE 434* 452*  BUN 11 13  CREATININE 1.03 0.90  CALCIUM 9.6  --     Lipid Panel:     Component Value Date/Time   CHOL 185  07/09/2016 0627   TRIG 634* 07/09/2016 0627   HDL 24* 07/09/2016 0627   CHOLHDL 7.7 07/09/2016 0627   VLDL UNABLE TO CALCULATE IF TRIGLYCERIDE OVER 400 mg/dL 07/09/2016 0627   LDLCALC UNABLE TO CALCULATE IF TRIGLYCERIDE OVER 400 mg/dL 07/09/2016 0627   HgbA1c:  Lab Results  Component Value Date   HGBA1C 9.8* 04/03/2015   Urine Drug Screen:     Component Value Date/Time   LABOPIA NEGATIVE 05/13/2010 1658   COCAINSCRNUR NEGATIVE 05/13/2010 1658   LABBENZ NEGATIVE 05/13/2010 1658   AMPHETMU NEGATIVE 05/13/2010 1658      IMAGING  Dg Chest 2 View  07/08/2016  CLINICAL DATA:  TIA today. EXAM: CHEST  2 VIEW COMPARISON:  04/01/2015 FINDINGS: Cardiomediastinal silhouette is normal. Mediastinal contours appear intact. There is prominence of the right hilum and new eventration of the right hemidiaphragm. There is no evidence of focal airspace consolidation, pleural effusion or pneumothorax. Osseous structures are without acute abnormality. Soft tissues are grossly normal. IMPRESSION: Prominence of the right hilum, which may represent overlapping vascular structures, however is noted to be asymmetric from the left and therefore lymphadenopathy cannot be excluded. New eventration of the right hemidiaphragm. Electronically Signed   By: Fidela Salisbury M.D.   On: 07/08/2016 22:22   Ct Head Wo Contrast  07/08/2016  CLINICAL DATA:  Left arm  weakness numbness and tingling. Transient gait and speech disturbances. EXAM: CT HEAD WITHOUT CONTRAST TECHNIQUE: Contiguous axial images were obtained from the base of the skull through the vertex without intravenous contrast. COMPARISON:  None. FINDINGS: Brain: No evidence of acute hemorrhage, extra-axial collection, ventriculomegaly, or mass effect. There are areas of hypoattenuation within the right basal ganglia and the subcortical white matter of the right frontal and parietal lobes. A hypoattenuated focus is also seen within the right cerebellar peduncle.  Vascular: No hyperdense vessel or unexpected calcification. Skull: Negative for fracture or focal lesion. Sinuses/Orbits: There is moderate to severe polypoid mucosal thickening of bilateral maxillary sinuses, and milder mucosal thickening of the sphenoid sinuses. Other: None. IMPRESSION: Abnormal areas of hypoattenuation within the subcortical white matter of the right frontal and parietal lobes, and right basal ganglia, with appearance most consistent by CT with age-indeterminate infarcts. Additional focus of hypoattenuation within the right cerebellar peduncle with indeterminate appearance. Chronic sinusitis. These results were called by telephone at the time of interpretation on 07/08/2016 at 6:08 pm to Dr. Zenovia Jarred , who verbally acknowledged these results. Electronically Signed   By: Fidela Salisbury M.D.   On: 07/08/2016 18:13   Mr Virgel Paling Wo Contrast  07/08/2016  CLINICAL DATA:  50 year old diabetic hypertensive male with acute onset of arm clumsiness yesterday. Residual mild weakness and numbness left hand. Subsequent encounter. EXAM: MRI HEAD WITHOUT CONTRAST MRA HEAD WITHOUT CONTRAST TECHNIQUE: Multiplanar, multiecho pulse sequences of the brain and surrounding structures were obtained without intravenous contrast. Angiographic images of the head were obtained using MRA technique without contrast. COMPARISON:  07/08/2016 CT. FINDINGS: MRI HEAD FINDINGS Several acute infarcts involving frontal and parietal lobes bilaterally. Majority of these are small with largest acute infarct posterior left frontal lobe measuring up to 1.4 cm. No associated hemorrhage with these acute infarcts. Remote right lenticular nucleus, caudate, corona radiata and centrum semiovale infarcts with blood-stained encephalomalacia. Subsequent mild dilation of the right lateral ventricle. Abnormal appearance of the right internal carotid artery as discussed below. Moderate chronic microvascular changes. Global atrophy  without hydrocephalus. No acute orbital abnormality. No intracranial mass lesion noted on this unenhanced exam. Paranasal sinus mucosal thickening most notable maxillary sinuses measuring up to 1 cm on right and complex and measuring up to 1 cm on the left. Small mega cisterna magna incidentally noted. 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 Minimal signal within portion of the right internal cavernous/supraclinoid segment may represent poor collateral flow. No flow seen within the right carotid terminus or right middle cerebral artery. Moderate narrowing left internal carotid artery cavernous segment junction with the supraclinoid segment. Mild to moderate narrowing proximal M1 segment left middle cerebral artery with early branching just beyond this region. Left anterior cerebral artery with collateral flow to the A2 segment of the right anterior cerebral artery. Left vertebral artery is dominant. Attenuated right vertebral artery. Irregularity and areas of narrowing involving portions of the posterior inferior cerebellar artery and anterior inferior cerebellar artery bilaterally. No high-grade stenosis of the basilar artery. Mild irregularity superior cerebellar artery greater on left. Mild moderate narrowing of portions of the posterior cerebral artery mid to distal branches. IMPRESSION: MRI HEAD Several acute infarcts involving frontal and parietal lobes bilaterally. Majority of these are small with largest acute infarct posterior left frontal lobe measuring up to 1.4 cm. No associated hemorrhage with these acute infarcts. Remote right lenticular nucleus, caudate, corona radiata and centrum semiovale infarcts with blood-stained encephalomalacia. Subsequent mild dilation of the  right lateral ventricle. Abnormal appearance of the right internal carotid artery as discussed below. Moderate chronic microvascular changes. Global atrophy without hydrocephalus. Paranasal sinus mucosal thickening  most notable maxillary sinuses measuring up to 1 cm on right and complex and measuring up to 1 cm on the left. 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 Minimal signal within portion of the right internal cavernous/supraclinoid segment may represent poor collateral flow. No flow seen within the right carotid terminus or right middle cerebral artery. Moderate narrowing left internal carotid artery cavernous segment junction with the supraclinoid segment. Mild to moderate narrowing proximal M1 segment left middle cerebral artery with early branching just beyond this region. Left anterior cerebral artery with collateral flow to the A2 segment of the right anterior cerebral artery. Left vertebral artery is dominant. Attenuated right vertebral artery. Irregularity and areas of narrowing involving portions of the posterior inferior cerebellar artery and anterior inferior cerebellar artery bilaterally. No high-grade stenosis of the basilar artery. Mild irregularity superior cerebellar artery greater on left. Mild to moderate narrowing of portions of the posterior cerebral artery mid to distal branches. Electronically Signed   By: Genia Del M.D.   On: 07/08/2016 21:03   Mr Brain Wo Contrast  07/08/2016  CLINICAL DATA:  50 year old diabetic hypertensive male with acute onset of arm clumsiness yesterday. Residual mild weakness and numbness left hand. Subsequent encounter. EXAM: MRI HEAD WITHOUT CONTRAST MRA HEAD WITHOUT CONTRAST TECHNIQUE: Multiplanar, multiecho pulse sequences of the brain and surrounding structures were obtained without intravenous contrast. Angiographic images of the head were obtained using MRA technique without contrast. COMPARISON:  07/08/2016 CT. FINDINGS: MRI HEAD FINDINGS Several acute infarcts involving frontal and parietal lobes bilaterally. Majority of these are small with largest acute infarct posterior left frontal lobe measuring up to 1.4 cm. No associated hemorrhage with these  acute infarcts. Remote right lenticular nucleus, caudate, corona radiata and centrum semiovale infarcts with blood-stained encephalomalacia. Subsequent mild dilation of the right lateral ventricle. Abnormal appearance of the right internal carotid artery as discussed below. Moderate chronic microvascular changes. Global atrophy without hydrocephalus. No acute orbital abnormality. No intracranial mass lesion noted on this unenhanced exam. Paranasal sinus mucosal thickening most notable maxillary sinuses measuring up to 1 cm on right and complex and measuring up to 1 cm on the left. Small mega cisterna magna incidentally noted. 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 Minimal signal within portion of the right internal cavernous/supraclinoid segment may represent poor collateral flow. No flow seen within the right carotid terminus or right middle cerebral artery. Moderate narrowing left internal carotid artery cavernous segment junction with the supraclinoid segment. Mild to moderate narrowing proximal M1 segment left middle cerebral artery with early branching just beyond this region. Left anterior cerebral artery with collateral flow to the A2 segment of the right anterior cerebral artery. Left vertebral artery is dominant. Attenuated right vertebral artery. Irregularity and areas of narrowing involving portions of the posterior inferior cerebellar artery and anterior inferior cerebellar artery bilaterally. No high-grade stenosis of the basilar artery. Mild irregularity superior cerebellar artery greater on left. Mild moderate narrowing of portions of the posterior cerebral artery mid to distal branches. IMPRESSION: MRI HEAD Several acute infarcts involving frontal and parietal lobes bilaterally. Majority of these are small with largest acute infarct posterior left frontal lobe measuring up to 1.4 cm. No associated hemorrhage with these acute infarcts. Remote right lenticular nucleus, caudate,  corona radiata and centrum semiovale infarcts with blood-stained encephalomalacia. Subsequent mild dilation  of the right lateral ventricle. Abnormal appearance of the right internal carotid artery as discussed below. Moderate chronic microvascular changes. Global atrophy without hydrocephalus. Paranasal sinus mucosal thickening most notable maxillary sinuses measuring up to 1 cm on right and complex and measuring up to 1 cm on the left. 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 Minimal signal within portion of the right internal cavernous/supraclinoid segment may represent poor collateral flow. No flow seen within the right carotid terminus or right middle cerebral artery. Moderate narrowing left internal carotid artery cavernous segment junction with the supraclinoid segment. Mild to moderate narrowing proximal M1 segment left middle cerebral artery with early branching just beyond this region. Left anterior cerebral artery with collateral flow to the A2 segment of the right anterior cerebral artery. Left vertebral artery is dominant. Attenuated right vertebral artery. Irregularity and areas of narrowing involving portions of the posterior inferior cerebellar artery and anterior inferior cerebellar artery bilaterally. No high-grade stenosis of the basilar artery. Mild irregularity superior cerebellar artery greater on left. Mild to moderate narrowing of portions of the posterior cerebral artery mid to distal branches. Electronically Signed   By: Genia Del M.D.   On: 07/08/2016 21:03   Carotid Doppler   Right ICA demonstrated abnormal pre occlusive waveforms consistent with known occlusive disease. Left ICA demonstrated normal patency with no evidence of stenosis. Bilateral vertebral arteries demonstrated antegrade flow.  PHYSICAL EXAM mildly obese middle aged Caucasian male not in distress. . Afebrile. Head is nontraumatic. Neck is supple without bruit.    Cardiac exam no murmur or gallop. Lungs  are clear to auscultation. Distal pulses are well felt. Neurological Exam ;  Awake alert oriented x 3 normal speech and language. Mild right eye ptosis. 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 Vision acuity and fields seem adequate. Mild left lower face asymmetry. Tongue midline. No drift. Mild diminished fine finger movements on left. Orbits right over left upper extremity. Mild left grip weak.. Normal sensation . Normal coordination.  ASSESSMENT/PLAN Jared Hart is a 50 y.o. male with history of diabetes, CHF, hypertension presenting with L arm clumsiness and abnormal gait. He did not receive IV t-PA.   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 Diffused intracranial atherosclerosis in large and small vessels  Carotid Doppler  R ICA occlusion  CTA head and neck to confirm occlusion - if flow present, consider VVS consult  2D Echo  pending   LDL unable to calculate d/t high TG  HgbA1c pending  SCDs ordered for VTE prophylaxis Diet heart healthy/carb modified Room service appropriate?: Yes; Fluid consistency:: Thin  aspirin 81 mg daily prior to admission, now on aspirin 325 mg daily. Given large vessel intracranial atherosclerosis, patient should be treated with aspirin 325 mg and clopidogrel 75 mg orally every day x 3 months for secondary stroke prevention. After 3 months, change to plavix alone. Long-term dual antiplatelets are contraindicated due to risk for intracerebral hemorrhage.   Patient counseled to be compliant with his antithrombotic medications  Ongoing aggressive stroke risk factor management  Consider stroke AF  Therapy recommendations:  No OT  Disposition:  Return home  Works as a Primary school teacher transmissions. Recommend taking  1 week off.  Stay hydrated within CHF limits  Hypertensive Emergency  BP as high as 180/103 in setting of neurologic symptoms  Stable  Permissive hypertension (OK if < 220/120) but gradually normalize in 5-7 days  Long-term BP goal normotensive  Hyperlipidemia  Home meds:  No statin  LDL unable to calculate d/t high TG, goal < 70  On liptor 40  Continue statin at discharge  Diabetes  Glucoses elevated 367, 259  HgbA1c pending, goal < 7.0  Uncontrolled  Diabetes nurse recommends levemir  Other Stroke Risk Factors  Smokeless tobacco (chew). Strongly advised to stop using.   Overweight, Body mass index is 27.82 kg/(m^2)., patient advised to lose weight - he has recent weight loss of 20-30 pounds - diet and exercise as appropriate   Combined systolic and diastolic CHF  Other Active Problems  Hx colon cancer, in remission  Hospital day #   [place] Intercourse for Pager information 07/09/2016 10:45 AM   I have personally examined this patient, reviewed notes, independently viewed imaging studies, participated in medical decision making and plan of care. I have made any additions or clarifications directly to the above note. Agree with note above. Marland Kitchen He presented with left hand weakness and numbness and hand is somewhat similar but milder episode a month ago. 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 Recommend aspirin 81 and Plavix 75 mg daily for 3 months followed by Plavix alone. Continue ongoing stroke workup. Aggressive risk factor management. Long discussion of the bedside with patient and wife and answered questions. Greater than 50% time during this 35 minute visit was spent on counseling and coordination of care about stroke risk, prevention, treatment and answered questions  Antony Contras, MD Medical Director [facility] Pager: [phone] 07/09/2016 1:34  PM   To contact Stroke Continuity provider, please refer to [email or web address] After hours, contact General Neurology

## 2016-07-09 NOTE — Care Management Note (Signed)
Case Management Note  Patient Details  Name: Jared Hart MRN: [id number] Date of Birth: 11-16-1966  Subjective/Objective:    Pt admitted with CVA. He is from home with his spouse. No insurance listed and his PCP listed is with [facility].                 Action/Plan: Awaiting PT/OT recommendations. CM will see patient about him continuing with [facility]. CM will also follow for further discharge needs.   Expected Discharge Date:  07/10/16               Expected Discharge Plan:     In-House Referral:     Discharge planning Services     Post Acute Care Choice:    Choice offered to:     DME Arranged:    DME Agency:     HH Arranged:    HH Agency:     Status of Service:  In process, will continue to follow  If discussed at Long Length of Stay Meetings, dates discussed:    Additional Comments:  Pollie Friar, RN 07/09/2016, 10:56 AM

## 2016-07-09 NOTE — Progress Notes (Signed)
OT Cancellation Note  Patient Details Name: Jared Hart MRN: [id number] DOB: 1966/09/28   Cancelled Treatment:    Reason Eval/Treat Not Completed: OT screened, no needs identified, will sign off  Rubyann Lingle A 07/09/2016, 11:21 AM

## 2016-07-10 DIAGNOSIS — I10 Essential (primary) hypertension: Secondary | ICD-10-CM

## 2016-07-10 DIAGNOSIS — E1165 Type 2 diabetes mellitus with hyperglycemia: Secondary | ICD-10-CM

## 2016-07-10 DIAGNOSIS — I639 Cerebral infarction, unspecified: Secondary | ICD-10-CM

## 2016-07-10 DIAGNOSIS — E785 Hyperlipidemia, unspecified: Secondary | ICD-10-CM

## 2016-07-10 DIAGNOSIS — I5042 Chronic combined systolic (congestive) and diastolic (congestive) heart failure: Secondary | ICD-10-CM

## 2016-07-10 LAB — BASIC METABOLIC PANEL
ANION GAP: 7 (ref 5–15)
BUN: 9 mg/dL (ref 6–20)
CALCIUM: 9 mg/dL (ref 8.9–10.3)
CO2: 27 mmol/L (ref 22–32)
CREATININE: 0.81 mg/dL (ref 0.61–1.24)
Chloride: 102 mmol/L (ref 101–111)
Glucose, Bld: 228 mg/dL — ABNORMAL HIGH (ref 65–99)
Potassium: 4 mmol/L (ref 3.5–5.1)
SODIUM: 136 mmol/L (ref 135–145)

## 2016-07-10 LAB — VAS US CAROTID
LCCAPDIAS: 26 cm/s
LEFT VERTEBRAL DIAS: -24 cm/s
LICADDIAS: -39 cm/s
LICADSYS: -71 cm/s
LICAPDIAS: -31 cm/s
Left CCA dist dias: -24 cm/s
Left CCA dist sys: -72 cm/s
Left CCA prox sys: 110 cm/s
Left ICA prox sys: -60 cm/s
RCCAPSYS: 86 cm/s
RIGHT ECA DIAS: -25 cm/s
RIGHT VERTEBRAL DIAS: 13 cm/s
Right cca dist sys: -26 cm/s

## 2016-07-10 LAB — MAGNESIUM: MAGNESIUM: 1.8 mg/dL (ref 1.7–2.4)

## 2016-07-10 LAB — GLUCOSE, CAPILLARY
GLUCOSE-CAPILLARY: 273 mg/dL — AB (ref 65–99)
Glucose-Capillary: 228 mg/dL — ABNORMAL HIGH (ref 65–99)

## 2016-07-10 MED ORDER — METFORMIN HCL 500 MG PO TABS: 500.0000 mg | ORAL_TABLET | Freq: Two times a day (BID) | ORAL | Status: DC

## 2016-07-10 MED ORDER — INSULIN STARTER KIT- PEN NEEDLES (ENGLISH)
1.0000 | Freq: Once | Status: DC
  Filled 2016-07-10: qty 1

## 2016-07-10 MED ORDER — "INSULIN SYRINGE 31G X 5/16"" 0.5 ML MISC": Status: DC

## 2016-07-10 MED ORDER — FENOFIBRATE 160 MG PO TABS: 160.0000 mg | ORAL_TABLET | Freq: Every day | ORAL | Status: DC

## 2016-07-10 MED ORDER — CLOPIDOGREL BISULFATE 75 MG PO TABS: 75.0000 mg | ORAL_TABLET | Freq: Every day | ORAL | Status: DC

## 2016-07-10 MED ORDER — INSULIN ASPART PROT & ASPART (70-30 MIX) 100 UNIT/ML ~~LOC~~ SUSP
7.0000 [IU] | Freq: Every day | SUBCUTANEOUS | Status: DC
Start: 2016-07-10 — End: 2016-07-10

## 2016-07-10 MED ORDER — INSULIN NPH ISOPHANE & REGULAR (70-30) 100 UNIT/ML ~~LOC~~ SUSP: 10.0000 [IU] | Freq: Two times a day (BID) | SUBCUTANEOUS | Status: DC

## 2016-07-10 MED ORDER — LIVING WELL WITH DIABETES BOOK
Freq: Once | Status: AC
  Administered 2016-07-10: 12:00:00
  Filled 2016-07-10: qty 1

## 2016-07-10 MED ORDER — INSULIN ASPART PROT & ASPART (70-30 MIX) 100 UNIT/ML ~~LOC~~ SUSP
5.0000 [IU] | Freq: Once | SUBCUTANEOUS | Status: AC
  Administered 2016-07-10: 5 [IU] via SUBCUTANEOUS

## 2016-07-10 MED ORDER — ATORVASTATIN CALCIUM 40 MG PO TABS: 40.0000 mg | ORAL_TABLET | Freq: Every day | ORAL | Status: DC

## 2016-07-10 MED ORDER — INSULIN ASPART PROT & ASPART (70-30 MIX) 100 UNIT/ML ~~LOC~~ SUSP: 10.0000 [IU] | Freq: Every day | SUBCUTANEOUS | Status: DC

## 2016-07-10 MED FILL — FENOFIBRATE 160 MG TABLET: 160 | 30 days supply | Qty: 30 | Fill #0

## 2016-07-10 MED FILL — TRUEPLUS SYR 0.5ML 31GX5/16: 31G X 5/16" | 30 days supply | Qty: 100 | Fill #0

## 2016-07-10 MED FILL — ?METFORMIN HCL 500MG TABLET: 500 | 30 days supply | Qty: 60 | Fill #0

## 2016-07-10 MED FILL — CLOPIDOGREL 75 MG TABLET: 75 | 30 days supply | Qty: 30 | Fill #0

## 2016-07-10 MED FILL — ?ATORVASTATIN 40MG TABLET: 40 | 30 days supply | Qty: 30 | Fill #0

## 2016-07-10 MED FILL — !NOVOLIN 70/30 100 UNITS/ML: (70-30) 100 | 50 days supply | Qty: 10 | Fill #0

## 2016-07-10 NOTE — Discharge Instructions (Signed)
Ischemic Stroke Treated Without Warfarin °An ischemic stroke (cerebrovascular accident) is the sudden death of brain tissue. It is a medical emergency. An ischemic stroke can cause permanent loss of brain function. This can cause problems with different parts of your body. °CAUSES °An ischemic stroke is caused by a decrease of oxygen supply to an area of your brain. It is usually the result of a small blood clot (embolus) or collection of cholesterol or fat (plaque) that blocks blood flow in the brain. An ischemic stroke can also be caused by blocked or damaged carotid arteries. °RISK FACTORS °· High blood pressure (hypertension). °· High cholesterol. °· Diabetes mellitus. °· Heart disease. °· The buildup of plaque in the blood vessels (peripheral artery disease or atherosclerosis). °· The buildup of plaque in the blood vessels that provide blood and oxygen to the brain (carotid artery stenosis). °· An abnormal heart rhythm (atrial fibrillation). °· Obesity. °· Smoking cigarettes. °· Taking oral contraceptives, especially in combination with using tobacco. °· Physical inactivity. °· A diet that is high in fats, salt (sodium), and calories. °· Excessive alcohol use. °· Use of illegal drugs, especially cocaine and methamphetamine. °· Being African American. °· Being over the age of 55 years. °· Family history of stroke. °· Previous history of blood clots, stroke, TIA (transient ischemic attack), or heart attack. °· Sickle cell disease. °SIGNS AND SYMPTOMS °These symptoms usually develop suddenly, or you may notice them after waking up from sleep. Symptoms may include sudden: °· Weakness or numbness in your face, arm, or leg, especially on one side of your body. °· Confusion. °· Trouble speaking (aphasia) or understanding speech. °· Trouble seeing with one or both eyes. °· Trouble walking or difficulty moving your arms or legs. °· Dizziness. °· Loss of balance or coordination. °· Severe headache with no known cause.  The headache is often described as the worst headache ever experienced. °DIAGNOSIS °Your health care provider can often determine the presence or absence of an ischemic stroke based on your symptoms, history, and physical exam. CT (computed tomography) of the brain is usually performed to confirm the stroke, determine causes, and determine stroke severity. Other tests may be done to find the cause of the stroke. These tests may include: °· ECG (electrocardiogram). °· Continuous heart monitoring. °· Echocardiogram. °· Carotid ultrasound. °· MRI. °· A scan of the brain circulation. °· Blood tests. °TREATMENT °It is very important to seek treatment at the first sign of stroke symptoms. Your health care provider may perform the following treatments within 6 hours of the onset of stroke symptoms: °· Medicine to dissolve the blood clot (thrombolytic). °· Inserting a device into the affected artery to remove the blood clot. °These treatments may not be effective if too much time has passed since your stroke symptoms began. Even if you do not know when your symptoms began, get treatment as soon as possible. There are other treatment options that may be given, such as: °· Oxygen. °· IV fluids. °· Medicines to thin the blood (anticoagulants). °· A procedure to widen blocked arteries. °Your treatment will depend on how long you have had your symptoms, the severity of your symptoms, and the cause of your symptoms. °Your health care provider will take measures to prevent short-term and long-term complications of stroke, such as: °· Breathing foreign material into the lungs (aspiration pneumonia). °· Blood clots in the legs. °· Bedsores. °· Falls. °Medicines and dietary changes may be used to help treat and manage risk factors for   stroke, such as diabetes and high blood pressure. °If any of your body's functions were impaired by stroke, you may work with physical, speech, or occupational therapists to help you recover. °HOME CARE  INSTRUCTIONS °· Take medicines only as directed by your health care provider. Follow the directions carefully. Medicines may be used to control risk factors for a stroke. Be sure that you understand all your medicine instructions. °· If swallow studies have determined that your swallowing reflex is present, you should eat healthy foods. Foods may need to be a soft or pureed consistency, or you may need to take small bites in order to avoid aspirating or choking. °· Follow physical activity guidelines as directed by your health care team. °· Do not use any tobacco products, including cigarettes, chewing tobacco, or electronic cigarettes. If you smoke, quit. If you need help quitting, ask your health care provider. °· Limit or stop alcohol use. °· A safe home environment is important to reduce the risk of falls. Your health care provider may arrange for specialists to evaluate your home. Having grab bars in the bedroom and bathroom is often important. Your health care provider may arrange for equipment to be used at home, such as raised toilets and a seat for the shower. °· Ongoing physical, occupational, and speech therapy may be needed to maximize your recovery after a stroke. If you have been advised to use a walker or a cane, use it at all times. Be sure to keep your therapy appointments. °· Keep all follow-up visits with your health care provider. This is very important. This includes any referrals, therapy, rehabilitation, and lab tests. Proper follow-up can prevent another stroke from occurring. °PREVENTION °The risk of a stroke can be decreased by appropriately treating high blood pressure, high cholesterol, diabetes, heart disease, and obesity. It can also be decreased by quitting smoking, limiting alcohol, and staying physically active. °SEEK IMMEDIATE MEDICAL CARE IF: °· You have sudden weakness or numbness in your face, arm, or leg, especially on one side of your body. °· You have sudden confusion. °· You  have sudden trouble speaking (aphasia) or understanding. °· You have sudden trouble seeing with one or both eyes. °· You have sudden trouble walking or difficulty moving your arms or legs. °· You have sudden dizziness. °· You have a sudden loss of balance or coordination. °· You have a sudden, severe headache with no known cause. °· You have a partial or total loss of consciousness. °Any of these symptoms may represent a serious problem that is an emergency. Do not wait to see if the symptoms will go away. Get medical help right away. Call your local emergency services (911 in [place] Do not drive yourself to the hospital. °  °This information is not intended to replace advice given to you by your health care provider. Make sure you discuss any questions you have with your health care provider. °  °Document Released: 09/30/2014 Document Reviewed: 09/30/2014 °[employer] Patient Education ©2016 [employer] ° °

## 2016-07-10 NOTE — Progress Notes (Signed)
STROKE TEAM PROGRESS NOTE   SUBJECTIVE (INTERVAL HISTORY) Multiple family members are in the room - in gerichairs and pop up bed. Patient lying in the bed. Family awoke upon entering. Patient without new complaints. Discussed CTA of the head that showed occluded ICA, at this point there is no intervention.I had a long d/w patient about recent stroke, risk for recurrent stroke/TIAs and answered questions. Patient needs aggressive risk factor management including diabetes control, weight loss, exercise, and control of other vascular risk factors including blood pressure and hyperlipidemia. Discussed dual antiplatelet therapy and that patient has to make tremendous effort especially with his diabetes that is uncontrolled.    OBJECTIVE Temp:  [98 F (36.7 C)-99.1 F (37.3 C)] 98.5 F (36.9 C) (07/12 1003) Pulse Rate:  [69-81] 81 (07/12 1003) Cardiac Rhythm:  [-] Normal sinus rhythm (07/12 0700) Resp:  [18-20] 20 (07/12 1003) BP: (128-180)/(72-109) 155/77 mmHg (07/12 1003) SpO2:  [97 %-100 %] 98 % (07/12 1003)  CBC:   Recent Labs Lab 07/08/16 1300 07/08/16 1333  WBC 9.1  --   NEUTROABS 6.6  --   HGB 13.5 15.3  HCT 39.0 45.0  MCV 84.1  --   PLT 271  --     Basic Metabolic Panel:   Recent Labs Lab 07/08/16 1300 07/08/16 1333 07/10/16 0620  NA 132* 134* 136  K 4.3 4.2 4.0  CL 99* 96* 102  CO2 23  --  27  GLUCOSE 434* 452* 228*  BUN 11 13 9   CREATININE 1.03 0.90 0.81  CALCIUM 9.6  --  9.0  MG  --   --  1.8    Lipid Panel:     Component Value Date/Time   CHOL 185 07/09/2016 0627   TRIG 634* 07/09/2016 0627   HDL 24* 07/09/2016 0627   CHOLHDL 7.7 07/09/2016 0627   VLDL UNABLE TO CALCULATE IF TRIGLYCERIDE OVER 400 mg/dL 07/09/2016 0627   LDLCALC UNABLE TO CALCULATE IF TRIGLYCERIDE OVER 400 mg/dL 07/09/2016 0627   HgbA1c:  Lab Results  Component Value Date   HGBA1C 11.0* 07/09/2016   Urine Drug Screen:     Component Value Date/Time   LABOPIA NEGATIVE 05/13/2010  1658   COCAINSCRNUR NEGATIVE 05/13/2010 1658   LABBENZ NEGATIVE 05/13/2010 1658   AMPHETMU NEGATIVE 05/13/2010 1658      IMAGING  Ct Angio Head W Or Wo Contrast  07/09/2016  EXAM: CT ANGIOGRAPHY HEAD AND NECK CLINICAL DATA:  Left arm numbness 2 days ago CONTRAST:  50 mL Isovue 370 COMPARISON:  Brain MRI 07/08/2016 TECHNIQUE: Multidetector CT imaging of the head and neck was performed using the standard protocol during bolus administration of intravenous contrast. Multiplanar CT image reconstructions and MIPs were obtained to evaluate the vascular anatomy. Carotid stenosis measurements (when applicable) are obtained utilizing NASCET criteria, using the distal internal carotid diameter as the denominator. FINDINGS: CTA NECK Aortic arch: Normal Right carotid system: There is diffuse severe narrowing of the right internal carotid artery beginning just distal to its origin. The right common carotid artery is normal. Left carotid system: Normal. Vertebral arteries:Left dominant vertebral artery. 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 Both vertebral origins are widely patent. Skeleton: No lytic or blastic osseous lesions. No advanced spinal canal or neural foraminal stenosis. Other neck: Parapharyngeal spaces, nasopharynx, oropharynx, hypopharynx and larynx are normal. No cervical adenopathy. Normal parotid and submandibular glands. Moderate bilateral maxillary mucosal thickening. Normal orbits. Visualized thorax incompletely visualized 3 mm nodule at the periphery of the right upper  lobe (series 41, image 1). No other pulmonary nodules or masses. No mediastinal adenopathy. CTA HEAD Anterior circulation: Severe narrowing of the petrous segment right internal carotid artery with loss of contrast enhancement near the origin of the clinoid segment. No contrast opacification of the right A1 segment. There is attenuated vasculature throughout the right MCA distribution. The right anterior cerebral  artery is supplied via the anterior communicating artery. Left anterior cerebral artery is normal. There is moderate focal narrowing of the proximal left M1 segment middle cerebral artery. Posterior circulation: The basilar artery and posterior cerebral arteries are normal. Bilateral superior cerebellar, anterior inferior cerebellar and posterior inferior cerebellar arteries are normal. Venous sinuses: Normal Anatomic variants: None Delayed phase: Old right insular/ external capsule lacunar infarct. No midline shift or mass effect. IMPRESSION: 1. Diffuse severe narrowing of the right internal carotid artery along nearly its entire length, beginning just distal to its origin. 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 2. Attenuated vascularity throughout the right MCA distribution, possibly supplied by pial collaterals. 3. Non opacification of the right anterior cerebral artery A1 segment. The A2 segments of both anterior cerebral arteries are supplied from the left ICA with preserved flow across the anterior communicating artery. 4. Moderate stenosis of the left MCA M1 segment. 5. Incompletely visualized 3 mm right upper lobe pulmonary nodule. No follow-up needed if patient is low-risk. Non-contrast chest CT can be considered in 12 months if patient is high-risk. This recommendation follows the consensus statement: Guidelines for Management of Incidental Pulmonary Nodules Detected on CT Images:From the Fleischner Society 2017; published online before print (10.1148/radiol.SG:[id number]). Electronically Signed   By: Ulyses Jarred M.D.   On: 07/09/2016 13:56   Dg Chest 2 View  07/08/2016  CLINICAL DATA:  TIA today. EXAM: CHEST  2 VIEW COMPARISON:  04/01/2015 FINDINGS: Cardiomediastinal silhouette is normal. Mediastinal contours appear intact. There is prominence of the right hilum and new eventration of the right hemidiaphragm. There is no evidence of focal airspace  consolidation, pleural effusion or pneumothorax. Osseous structures are without acute abnormality. Soft tissues are grossly normal. IMPRESSION: Prominence of the right hilum, which may represent overlapping vascular structures, however is noted to be asymmetric from the left and therefore lymphadenopathy cannot be excluded. New eventration of the right hemidiaphragm. Electronically Signed   By: Fidela Salisbury M.D.   On: 07/08/2016 22:22   Ct Head Wo Contrast  07/08/2016  CLINICAL DATA:  Left arm weakness numbness and tingling. Transient gait and speech disturbances. EXAM: CT HEAD WITHOUT CONTRAST TECHNIQUE: Contiguous axial images were obtained from the base of the skull through the vertex without intravenous contrast. COMPARISON:  None. FINDINGS: Brain: No evidence of acute hemorrhage, extra-axial collection, ventriculomegaly, or mass effect. There are areas of hypoattenuation within the right basal ganglia and the subcortical white matter of the right frontal and parietal lobes. A hypoattenuated focus is also seen within the right cerebellar peduncle. Vascular: No hyperdense vessel or unexpected calcification. Skull: Negative for fracture or focal lesion. Sinuses/Orbits: There is moderate to severe polypoid mucosal thickening of bilateral maxillary sinuses, and milder mucosal thickening of the sphenoid sinuses. Other: None. IMPRESSION: Abnormal areas of hypoattenuation within the subcortical white matter of the right frontal and parietal lobes, and right basal ganglia, with appearance most consistent by CT with age-indeterminate infarcts. Additional focus of hypoattenuation within the right cerebellar peduncle with indeterminate appearance. Chronic sinusitis. These results were called by telephone at the time of interpretation on 07/08/2016  at 6:08 pm to Dr. Zenovia Jarred , who verbally acknowledged these results. Electronically Signed   By: Fidela Salisbury M.D.   On: 07/08/2016 18:13   Ct Angio  Neck W Or Wo Contrast  07/09/2016  EXAM: CT ANGIOGRAPHY HEAD AND NECK CLINICAL DATA:  Left arm numbness 2 days ago CONTRAST:  50 mL Isovue 370 COMPARISON:  Brain MRI 07/08/2016 TECHNIQUE: Multidetector CT imaging of the head and neck was performed using the standard protocol during bolus administration of intravenous contrast. Multiplanar CT image reconstructions and MIPs were obtained to evaluate the vascular anatomy. Carotid stenosis measurements (when applicable) are obtained utilizing NASCET criteria, using the distal internal carotid diameter as the denominator. FINDINGS: CTA NECK Aortic arch: Normal Right carotid system: There is diffuse severe narrowing of the right internal carotid artery beginning just distal to its origin. The right common carotid artery is normal. Left carotid system: Normal. Vertebral arteries:Left dominant vertebral artery. 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 Both vertebral origins are widely patent. Skeleton: No lytic or blastic osseous lesions. No advanced spinal canal or neural foraminal stenosis. Other neck: Parapharyngeal spaces, nasopharynx, oropharynx, hypopharynx and larynx are normal. No cervical adenopathy. Normal parotid and submandibular glands. Moderate bilateral maxillary mucosal thickening. Normal orbits. Visualized thorax incompletely visualized 3 mm nodule at the periphery of the right upper lobe (series 41, image 1). No other pulmonary nodules or masses. No mediastinal adenopathy. CTA HEAD Anterior circulation: Severe narrowing of the petrous segment right internal carotid artery with loss of contrast enhancement near the origin of the clinoid segment. No contrast opacification of the right A1 segment. There is attenuated vasculature throughout the right MCA distribution. The right anterior cerebral artery is supplied via the anterior communicating artery. Left anterior cerebral artery is normal. There is moderate focal narrowing of the  proximal left M1 segment middle cerebral artery. Posterior circulation: The basilar artery and posterior cerebral arteries are normal. Bilateral superior cerebellar, anterior inferior cerebellar and posterior inferior cerebellar arteries are normal. Venous sinuses: Normal Anatomic variants: None Delayed phase: Old right insular/ external capsule lacunar infarct. No midline shift or mass effect. IMPRESSION: 1. Diffuse severe narrowing of the right internal carotid artery along nearly its entire length, beginning just distal to its origin. 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 2. Attenuated vascularity throughout the right MCA distribution, possibly supplied by pial collaterals. 3. Non opacification of the right anterior cerebral artery A1 segment. The A2 segments of both anterior cerebral arteries are supplied from the left ICA with preserved flow across the anterior communicating artery. 4. Moderate stenosis of the left MCA M1 segment. 5. Incompletely visualized 3 mm right upper lobe pulmonary nodule. No follow-up needed if patient is low-risk. Non-contrast chest CT can be considered in 12 months if patient is high-risk. This recommendation follows the consensus statement: Guidelines for Management of Incidental Pulmonary Nodules Detected on CT Images:From the Fleischner Society 2017; published online before print (10.1148/radiol.IJ:2314499). Electronically Signed   By: Ulyses Jarred M.D.   On: 07/09/2016 13:56   Mr Virgel Paling Wo Contrast  07/08/2016  CLINICAL DATA:  50 year old diabetic hypertensive male with acute onset of arm clumsiness yesterday. Residual mild weakness and numbness left hand. Subsequent encounter. EXAM: MRI HEAD WITHOUT CONTRAST MRA HEAD WITHOUT CONTRAST TECHNIQUE: Multiplanar, multiecho pulse sequences of the brain and surrounding structures were obtained without intravenous contrast. Angiographic images of the head were obtained using MRA  technique without contrast. COMPARISON:  07/08/2016 CT.  FINDINGS: MRI HEAD FINDINGS Several acute infarcts involving frontal and parietal lobes bilaterally. Majority of these are small with largest acute infarct posterior left frontal lobe measuring up to 1.4 cm. No associated hemorrhage with these acute infarcts. Remote right lenticular nucleus, caudate, corona radiata and centrum semiovale infarcts with blood-stained encephalomalacia. Subsequent mild dilation of the right lateral ventricle. Abnormal appearance of the right internal carotid artery as discussed below. Moderate chronic microvascular changes. Global atrophy without hydrocephalus. No acute orbital abnormality. No intracranial mass lesion noted on this unenhanced exam. Paranasal sinus mucosal thickening most notable maxillary sinuses measuring up to 1 cm on right and complex and measuring up to 1 cm on the left. Small mega cisterna magna incidentally noted. 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 Minimal signal within portion of the right internal cavernous/supraclinoid segment may represent poor collateral flow. No flow seen within the right carotid terminus or right middle cerebral artery. Moderate narrowing left internal carotid artery cavernous segment junction with the supraclinoid segment. Mild to moderate narrowing proximal M1 segment left middle cerebral artery with early branching just beyond this region. Left anterior cerebral artery with collateral flow to the A2 segment of the right anterior cerebral artery. Left vertebral artery is dominant. Attenuated right vertebral artery. Irregularity and areas of narrowing involving portions of the posterior inferior cerebellar artery and anterior inferior cerebellar artery bilaterally. No high-grade stenosis of the basilar artery. Mild irregularity superior cerebellar artery greater on left. Mild moderate narrowing of portions of the posterior cerebral artery mid to distal  branches. IMPRESSION: MRI HEAD Several acute infarcts involving frontal and parietal lobes bilaterally. Majority of these are small with largest acute infarct posterior left frontal lobe measuring up to 1.4 cm. No associated hemorrhage with these acute infarcts. Remote right lenticular nucleus, caudate, corona radiata and centrum semiovale infarcts with blood-stained encephalomalacia. Subsequent mild dilation of the right lateral ventricle. Abnormal appearance of the right internal carotid artery as discussed below. Moderate chronic microvascular changes. Global atrophy without hydrocephalus. Paranasal sinus mucosal thickening most notable maxillary sinuses measuring up to 1 cm on right and complex and measuring up to 1 cm on the left. 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 Minimal signal within portion of the right internal cavernous/supraclinoid segment may represent poor collateral flow. No flow seen within the right carotid terminus or right middle cerebral artery. Moderate narrowing left internal carotid artery cavernous segment junction with the supraclinoid segment. Mild to moderate narrowing proximal M1 segment left middle cerebral artery with early branching just beyond this region. Left anterior cerebral artery with collateral flow to the A2 segment of the right anterior cerebral artery. Left vertebral artery is dominant. Attenuated right vertebral artery. Irregularity and areas of narrowing involving portions of the posterior inferior cerebellar artery and anterior inferior cerebellar artery bilaterally. No high-grade stenosis of the basilar artery. Mild irregularity superior cerebellar artery greater on left. Mild to moderate narrowing of portions of the posterior cerebral artery mid to distal branches. Electronically Signed   By: Genia Del M.D.   On: 07/08/2016 21:03   Mr Brain Wo Contrast  07/08/2016  CLINICAL DATA:  50 year old diabetic hypertensive male with acute onset of arm  clumsiness yesterday. Residual mild weakness and numbness left hand. Subsequent encounter. EXAM: MRI HEAD WITHOUT CONTRAST MRA HEAD WITHOUT CONTRAST TECHNIQUE: Multiplanar, multiecho pulse sequences of the brain and surrounding structures were obtained without intravenous contrast. Angiographic images of the head were obtained using MRA technique without contrast. COMPARISON:  07/08/2016  CT. FINDINGS: MRI HEAD FINDINGS Several acute infarcts involving frontal and parietal lobes bilaterally. Majority of these are small with largest acute infarct posterior left frontal lobe measuring up to 1.4 cm. No associated hemorrhage with these acute infarcts. Remote right lenticular nucleus, caudate, corona radiata and centrum semiovale infarcts with blood-stained encephalomalacia. Subsequent mild dilation of the right lateral ventricle. Abnormal appearance of the right internal carotid artery as discussed below. Moderate chronic microvascular changes. Global atrophy without hydrocephalus. No acute orbital abnormality. No intracranial mass lesion noted on this unenhanced exam. Paranasal sinus mucosal thickening most notable maxillary sinuses measuring up to 1 cm on right and complex and measuring up to 1 cm on the left. Small mega cisterna magna incidentally noted. 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 Minimal signal within portion of the right internal cavernous/supraclinoid segment may represent poor collateral flow. No flow seen within the right carotid terminus or right middle cerebral artery. Moderate narrowing left internal carotid artery cavernous segment junction with the supraclinoid segment. Mild to moderate narrowing proximal M1 segment left middle cerebral artery with early branching just beyond this region. Left anterior cerebral artery with collateral flow to the A2 segment of the right anterior cerebral artery. Left vertebral artery is dominant. Attenuated right vertebral artery.  Irregularity and areas of narrowing involving portions of the posterior inferior cerebellar artery and anterior inferior cerebellar artery bilaterally. No high-grade stenosis of the basilar artery. Mild irregularity superior cerebellar artery greater on left. Mild moderate narrowing of portions of the posterior cerebral artery mid to distal branches. IMPRESSION: MRI HEAD Several acute infarcts involving frontal and parietal lobes bilaterally. Majority of these are small with largest acute infarct posterior left frontal lobe measuring up to 1.4 cm. No associated hemorrhage with these acute infarcts. Remote right lenticular nucleus, caudate, corona radiata and centrum semiovale infarcts with blood-stained encephalomalacia. Subsequent mild dilation of the right lateral ventricle. Abnormal appearance of the right internal carotid artery as discussed below. Moderate chronic microvascular changes. Global atrophy without hydrocephalus. Paranasal sinus mucosal thickening most notable maxillary sinuses measuring up to 1 cm on right and complex and measuring up to 1 cm on the left. 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 Minimal signal within portion of the right internal cavernous/supraclinoid segment may represent poor collateral flow. No flow seen within the right carotid terminus or right middle cerebral artery. Moderate narrowing left internal carotid artery cavernous segment junction with the supraclinoid segment. Mild to moderate narrowing proximal M1 segment left middle cerebral artery with early branching just beyond this region. Left anterior cerebral artery with collateral flow to the A2 segment of the right anterior cerebral artery. Left vertebral artery is dominant. Attenuated right vertebral artery. Irregularity and areas of narrowing involving portions of the posterior inferior cerebellar artery and anterior inferior cerebellar artery bilaterally. No high-grade stenosis of the basilar artery. Mild  irregularity superior cerebellar artery greater on left. Mild to moderate narrowing of portions of the posterior cerebral artery mid to distal branches. Electronically Signed   By: Genia Del M.D.   On: 07/08/2016 21:03   Carotid Doppler   Right ICA demonstrated abnormal pre occlusive waveforms consistent with known occlusive disease. Left ICA demonstrated normal patency with no evidence of stenosis. Bilateral vertebral arteries demonstrated antegrade flow.  2D Echocardiogram  - Left ventricle: The cavity size was normal. Wall thickness was increased in a pattern of moderate LVH. Systolic function was mildly to moderately reduced. The estimated ejection fraction was in the range  of 40% to 45%. Diffuse hypokinesis. Features are consistent with a pseudonormal left ventricular filling pattern, with concomitant abnormal relaxation and increased filling pressure (grade 2 diastolic dysfunction). - Right ventricle: Systolic function was mildly reduced.   PHYSICAL EXAM mildly obese middle aged Caucasian male not in distress. . Afebrile. Head is nontraumatic. Neck is supple without bruit.    Cardiac exam no murmur or gallop. Lungs are clear to auscultation. Distal pulses are well felt. Neurological Exam ;  Awake alert oriented x 3 normal speech and language. Mild right eye ptosis. 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 Vision acuity and fields seem adequate. Mild left lower face asymmetry. Tongue midline. No drift. Mild diminished fine finger movements on left. Orbits right over left upper extremity. Mild left grip weak.. Normal sensation . Normal coordination.  ASSESSMENT/PLAN [name] is a 50 y.o. male with history of diabetes, CHF, hypertension presenting with L arm clumsiness and abnormal gait. He did not receive IV t-PA.   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 Diffused intracranial atherosclerosis in large and small vessels  Carotid Doppler  R ICA occlusion  CTA head and neck confirmed R ICA occlusion, no intervention possible due to complete occlusion  2D Echo  EF 40-45%. No source of embolus   LDL unable to calculate d/t high TG  HgbA1c 11, uncontrolled  SCDs ordered for VTE prophylaxis Diet heart healthy/carb modified Room service appropriate?: Yes; Fluid consistency:: Thin  aspirin 81 mg daily prior to admission, now on aspirin 325 mg daily. Given large vessel intracranial atherosclerosis, patient should be treated with aspirin 325 mg and clopidogrel 75 mg orally every day x 3 months for secondary stroke prevention. After 3 months, change to plavix alone. Long-term dual antiplatelets are contraindicated due to risk for intracerebral hemorrhage.   Patient counseled to be compliant with his antithrombotic medications  Ongoing aggressive stroke risk factor management  Consider stroke AF  Therapy recommendations:  No OT  Disposition:  Return home  Works as a Primary school teacher transmissions. Recommend taking 1 week off.  Stay hydrated within CHF limits  Hypertensive EmergencyBP as high as 180/103 in setting of neurologic symptoms  Stable  Permissive hypertension (OK if < 220/120) but gradually normalize in 5-7 days  Long-term BP goal normotensive  Hyperlipidemia  Home meds:  No statin  LDL unable to calculate d/t high TG, goal < 70  On liptor 40. Fenofibrate also added by Dr. Carles Collet.  Continue statin at discharge  Diabetes  Glucoses elevated 367, 259  HgbA1c 11 uncontrolled, goal < 7.0  Uncontrolled  Diabetes nurse recommends levemir  Other Stroke Risk Factors  Smokeless tobacco (chew). Strongly advised to stop using.   Overweight, Body mass index is 27.82 kg/(m^2)., patient advised to lose weight -  he has recent weight loss of 20-30 pounds - diet and exercise as appropriate   Combined systolic and diastolic CHF  Other Active Problems  Hx colon cancer, in remission    Personally examined patient and images, and have participated in and made any corrections needed to history, physical, neuro exam,assessment and plan as stated above.  I have personally obtained the history, evaluated lab date, reviewed imaging studies and agree with radiology interpretations. Stroke team will sign off at this  time. Follow up with Dr. Leonie Man or Dr. Jaynee Eagles in 2 months outpatient.   Sarina Ill, MD Stroke Neurology [phone] [facility]     To contact Stroke Continuity provider, please refer to [email or web address] After hours, contact General Neurology

## 2016-07-10 NOTE — Progress Notes (Signed)
Inpatient Diabetes Program Recommendations  AACE/ADA: New Consensus Statement on Inpatient Glycemic Control (2015)  Target Ranges:  Prepandial:   less than 140 mg/dL      Peak postprandial:   less than 180 mg/dL (1-2 hours)      Critically ill patients:  140 - 180 mg/dL   Lab Results  Component Value Date   GLUCAP 273* 07/10/2016   HGBA1C 11.0* 07/09/2016   Inpatient Diabetes Program Recommendations: Noted started on insulin. Ordered insulin starter kit with needles. Spoke with nurse and to begin teaching insulin administration and have patient give own injections to prepare for discharge home. Please have patient view DM videos.  Thank you, Jared Hart. Jared Burkitt, RN, MSN, CDE Inpatient Glycemic Control Team Team Pager [phone] (8am-5pm) 07/10/2016 12:10 PM

## 2016-07-10 NOTE — Progress Notes (Signed)
Discharge orders received.  Discharge instructions and follow-up appointments reviewed with the patient.  VSS upon discharge.  IV removed and education complete.  Pt refused wheelchair transport out, walked out to main entrance.  Cori Razor, RN

## 2016-07-10 NOTE — Discharge Summary (Addendum)
Physician Discharge Summary  Jared Hart [id number] DOB: 02-24-1966  PCP: Maren Reamer, MD  Admit date: 07/08/2016 Discharge date: 07/10/2016  Admitted From: Home Disposition:  Home  Recommendations for Outpatient Follow-up:  1. PCP in 1 week. Will need reassessment and management of poorly controlled diabetes (requested/discussed with case management to assist with early appointment). 2. Dr. Antony Contras, Neurology in 2 months. 3. Recommend outpatient evaluation of RUL pulmonary nodule that was noted on CTA head  Home Health: None Equipment/Devices: None    Discharge Condition: Improved and stable.  CODE STATUS: Full  Diet recommendation: Heart healthy and diabetic diet.  Discharge Diagnoses:  Active Problems:   Malignant neoplasm of colon (HCC)   HYPERTENSION, BENIGN ESSENTIAL   Type 2 diabetes mellitus with hyperglycemia (HCC)   Stroke-like symptom   TIA (transient ischemic attack)   Chronic combined systolic (congestive) and diastolic (congestive) heart failure (HCC)   Hyperlipidemia   Cerebrovascular accident (CVA) due to occlusion of precerebral artery ([place])   Acute CVA (cerebrovascular accident) ([place])   Brief/Interim Summary: 50 year old male with history of systolic and diastolic CHF, hypertension, hyperlipidemia, diabetes mellitus, colon cancer presented with 2 day history of intermittent left approximate weakness, dysesthesias, gait instability, and dysarthria. This began on 07/07/2016 around lunch time. The patient went to work, but still has some difficulty with his left arm and hand. As a result, he presented to the emergency department for further workup. Neurology was consulted, and a full stroke workup was undertaken. The patient feels that his symptoms are mostly improved except for some mild weakness and clumsiness of his left hand.  Assessment/Plan: Acute Stroke - Nondominant small bilateral frontal and parietal infarcts, embolic secondary to right  ICA occlusion, at risk for additional strokes. - MRI brain: Small bilateral frontal and parietal infarcts. - MRA brain: Right ICA occlusion. Diffuse intracranial atherosclerosis and large and small vessels. - Carotid Doppler: Right ICA occlusion. - CT head and neck confirmed right ICA occlusion, no intervention possible due to complete occlusion. - 2-D echo: LVEF 40-45 percent. No source of embolus. - LDL unable to calculate due to high triglycerides. - Hemoglobin A1c 11 - PT and OT evaluated and recommended no follow-up. - Neurology consultation and follow-up appreciated. Discussed with Dr. Jaynee Eagles: Due to large vessel intracranial atherosclerosis, recommended aspirin 81 MG daily + clopidogrel 75 MG daily 3 months followed by clopidogrel alone. - Aggressive stroke risk factor management was counseled to patient and his wife and they verbalized understanding. - As per neurologist advice, recommended taking 1 week off from work. Patient works as a Primary school teacher transmissions.  Chronic systolic and diastolic CHF - Clinically compensated. Some of his medications were held in the hospital. 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  - Consider repeating 2-D echo in a couple of months to reassess LV function.  Hypertensive emergency/essential Hypertension  - BP as high as 108/103 in the setting of neurology, symptoms. Allowed for permissive hypertension while hospitalized. Continue prior home dose of diuretics, carvedilol and lisinopril at discharge.  Hyperlipidemia - LDL unable to calculate due to high triglycerides, goal <70. Started atorvastatin 40 mg daily. Triglycerides 634. Started fenofibrate. Recommend getting LFTs in 3-4 weeks.  Diabetes mellitus type 2, uncontrolled -Hemoglobin A1c: 11. Patient indicated that he has not taken any of his oral hypoglycemics for 2  months. He has not checked his CBGs for the same period  of time. Due to very high A1c, initiated 70/30 insulin and may continue home dose of metformin for a couple of months at which time A1c can be reassessed and if better may consider switching him off of insulins. Counseled extensively regarding importance of all aspects of diabetes care and patient and his wife verbalized understanding. Diabetes education including insulin self administration provided.   Pseudohyponatremia -Secondary to hyperglycemia  3 mm RUL pulmonary nodule - Noted on CTA head. Reportedly has never smoked. Consider noncontrast CT chest as outpatient to further evaluate.  Discharge Instructions      Discharge Instructions    Call MD for:    Complete by:  As directed   Strokelike symptoms.     Diet - low sodium heart healthy    Complete by:  As directed      Diet Carb Modified    Complete by:  As directed      Increase activity slowly    Complete by:  As directed      Other Restrictions    Complete by:  As directed   Recommend taking 1 week off from work.            Medication List    STOP taking these medications        glipiZIDE 5 MG tablet  Commonly known as:  GLUCOTROL      TAKE these medications        aspirin 81 MG EC tablet  Take 1 tablet (81 mg total) by mouth daily.     atorvastatin 40 MG tablet  Commonly known as:  LIPITOR  Take 1 tablet (40 mg total) by mouth daily at 6 PM.     carvedilol 25 MG tablet  Commonly known as:  COREG  Take 1 tablet (25 mg total) by mouth 2 (two) times daily with a meal.     clopidogrel 75 MG tablet  Commonly known as:  PLAVIX  Take 1 tablet (75 mg total) by mouth daily.     fenofibrate 160 MG tablet  Take 1 tablet (160 mg total) by mouth daily.     furosemide 40 MG tablet  Commonly known as:  LASIX  Take 1 tablet (40 mg total) by mouth daily.     insulin NPH-regular Human (70-30) 100 UNIT/ML injection  Commonly known as:  NOVOLIN 70/30  Inject  10 Units into the skin 2 (two) times daily with a meal. May use [employer] brand.     INSULIN SYRINGE .5CC/31GX5/16" 31G X 5/16" 0.5 ML Misc  Use as per directions.     lisinopril 20 MG tablet  Commonly known as:  PRINIVIL,ZESTRIL  Take 1 tablet (20 mg total) by mouth 2 (two) times daily.     metFORMIN 500 MG tablet  Commonly known as:  GLUCOPHAGE  Take 1 tablet (500 mg total) by mouth 2 (two) times daily with a meal. Metformin held due to IV dye given on 07/09/16. Resume on 07/12/16.  Start taking on:  07/12/2016     spironolactone 25 MG tablet  Commonly known as:  ALDACTONE  Take 1 tablet (25 mg total) by mouth daily.       Follow-up Information    Follow up with [facility] On 09/09/2016.   [id number]. Your appointment time is 1:45 pm. please arrive early and bring your current medications.   Contact information:   [place] [phone]  Follow up with [place].   Why:  Try and make an appointment this next week and cancel [facility] appointment if able to obtain appt at [facility]   Contact information:   [address] [place] [phone]      Follow up with SETHI,PRAMOD, MD. Schedule an appointment as soon as possible for a visit in 2 months.   Specialties:  Neurology, Radiology   Contact information:   [address] [place] [address] [phone]      No Known Allergies  Consultations:  Neurology.   Procedures/Studies: Ct Angio Head W Or Wo Contrast  07/09/2016  EXAM: CT ANGIOGRAPHY HEAD AND NECK CLINICAL DATA:  Left arm numbness 2 days ago CONTRAST:  50 mL Isovue 370 COMPARISON:  Brain MRI 07/08/2016 TECHNIQUE: Multidetector CT imaging of the head and neck was performed using the standard protocol during bolus administration of intravenous contrast. Multiplanar CT image reconstructions and MIPs were obtained to evaluate the  vascular anatomy. Carotid stenosis measurements (when applicable) are obtained utilizing NASCET criteria, using the distal internal carotid diameter as the denominator. FINDINGS: CTA NECK Aortic arch: Normal Right carotid system: There is diffuse severe narrowing of the right internal carotid artery beginning just distal to its origin. The right common carotid artery is normal. Left carotid system: Normal. Vertebral arteries:Left dominant vertebral artery. 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 Both vertebral origins are widely patent. Skeleton: No lytic or blastic osseous lesions. No advanced spinal canal or neural foraminal stenosis. Other neck: Parapharyngeal spaces, nasopharynx, oropharynx, hypopharynx and larynx are normal. No cervical adenopathy. Normal parotid and submandibular glands. Moderate bilateral maxillary mucosal thickening. Normal orbits. Visualized thorax incompletely visualized 3 mm nodule at the periphery of the right upper lobe (series 41, image 1). No other pulmonary nodules or masses. No mediastinal adenopathy. CTA HEAD Anterior circulation: Severe narrowing of the petrous segment right internal carotid artery with loss of contrast enhancement near the origin of the clinoid segment. No contrast opacification of the right A1 segment. There is attenuated vasculature throughout the right MCA distribution. The right anterior cerebral artery is supplied via the anterior communicating artery. Left anterior cerebral artery is normal. There is moderate focal narrowing of the proximal left M1 segment middle cerebral artery. Posterior circulation: The basilar artery and posterior cerebral arteries are normal. Bilateral superior cerebellar, anterior inferior cerebellar and posterior inferior cerebellar arteries are normal. Venous sinuses: Normal Anatomic variants: None Delayed phase: Old right insular/ external capsule lacunar infarct. No midline shift or mass effect. IMPRESSION: 1.  Diffuse severe narrowing of the right internal carotid artery along nearly its entire length, beginning just distal to its origin. 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 2. Attenuated vascularity throughout the right MCA distribution, possibly supplied by pial collaterals. 3. Non opacification of the right anterior cerebral artery A1 segment. The A2 segments of both anterior cerebral arteries are supplied from the left ICA with preserved flow across the anterior communicating artery. 4. Moderate stenosis of the left MCA M1 segment. 5. Incompletely visualized 3 mm right upper lobe pulmonary nodule. No follow-up needed if patient is low-risk. Non-contrast chest CT can be considered in 12 months if patient is high-risk. This recommendation follows the consensus statement: Guidelines for Management of Incidental Pulmonary Nodules Detected on CT Images:From the Fleischner Society 2017; published online before print (10.1148/radiol.IJ:2314499). Electronically Signed   By: Ulyses Jarred M.D.   On:  07/09/2016 13:56   Dg Chest 2 View  07/08/2016  CLINICAL DATA:  TIA today. EXAM: CHEST  2 VIEW COMPARISON:  04/01/2015 FINDINGS: Cardiomediastinal silhouette is normal. Mediastinal contours appear intact. There is prominence of the right hilum and new eventration of the right hemidiaphragm. There is no evidence of focal airspace consolidation, pleural effusion or pneumothorax. Osseous structures are without acute abnormality. Soft tissues are grossly normal. IMPRESSION: Prominence of the right hilum, which may represent overlapping vascular structures, however is noted to be asymmetric from the left and therefore lymphadenopathy cannot be excluded. New eventration of the right hemidiaphragm. Electronically Signed   By: Fidela Salisbury M.D.   On: 07/08/2016 22:22   Ct Head Wo Contrast  07/08/2016  CLINICAL DATA:  Left arm weakness numbness and tingling. Transient gait  and speech disturbances. EXAM: CT HEAD WITHOUT CONTRAST TECHNIQUE: Contiguous axial images were obtained from the base of the skull through the vertex without intravenous contrast. COMPARISON:  None. FINDINGS: Brain: No evidence of acute hemorrhage, extra-axial collection, ventriculomegaly, or mass effect. There are areas of hypoattenuation within the right basal ganglia and the subcortical white matter of the right frontal and parietal lobes. A hypoattenuated focus is also seen within the right cerebellar peduncle. Vascular: No hyperdense vessel or unexpected calcification. Skull: Negative for fracture or focal lesion. Sinuses/Orbits: There is moderate to severe polypoid mucosal thickening of bilateral maxillary sinuses, and milder mucosal thickening of the sphenoid sinuses. Other: None. IMPRESSION: Abnormal areas of hypoattenuation within the subcortical white matter of the right frontal and parietal lobes, and right basal ganglia, with appearance most consistent by CT with age-indeterminate infarcts. Additional focus of hypoattenuation within the right cerebellar peduncle with indeterminate appearance. Chronic sinusitis. These results were called by telephone at the time of interpretation on 07/08/2016 at 6:08 pm to Dr. Zenovia Jarred , who verbally acknowledged these results. Electronically Signed   By: Fidela Salisbury M.D.   On: 07/08/2016 18:13   Ct Angio Neck W Or Wo Contrast  07/09/2016  EXAM: CT ANGIOGRAPHY HEAD AND NECK CLINICAL DATA:  Left arm numbness 2 days ago CONTRAST:  50 mL Isovue 370 COMPARISON:  Brain MRI 07/08/2016 TECHNIQUE: Multidetector CT imaging of the head and neck was performed using the standard protocol during bolus administration of intravenous contrast. Multiplanar CT image reconstructions and MIPs were obtained to evaluate the vascular anatomy. Carotid stenosis measurements (when applicable) are obtained utilizing NASCET criteria, using the distal internal carotid diameter as  the denominator. FINDINGS: CTA NECK Aortic arch: Normal Right carotid system: There is diffuse severe narrowing of the right internal carotid artery beginning just distal to its origin. The right common carotid artery is normal. Left carotid system: Normal. Vertebral arteries:Left dominant vertebral artery. 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 Both vertebral origins are widely patent. Skeleton: No lytic or blastic osseous lesions. No advanced spinal canal or neural foraminal stenosis. Other neck: Parapharyngeal spaces, nasopharynx, oropharynx, hypopharynx and larynx are normal. No cervical adenopathy. Normal parotid and submandibular glands. Moderate bilateral maxillary mucosal thickening. Normal orbits. Visualized thorax incompletely visualized 3 mm nodule at the periphery of the right upper lobe (series 41, image 1). No other pulmonary nodules or masses. No mediastinal adenopathy. CTA HEAD Anterior circulation: Severe narrowing of the petrous segment right internal carotid artery with loss of contrast enhancement near the origin of the clinoid segment. No contrast opacification of the right A1 segment. There is attenuated vasculature throughout the right MCA distribution. The right anterior cerebral artery is  supplied via the anterior communicating artery. Left anterior cerebral artery is normal. There is moderate focal narrowing of the proximal left M1 segment middle cerebral artery. Posterior circulation: The basilar artery and posterior cerebral arteries are normal. Bilateral superior cerebellar, anterior inferior cerebellar and posterior inferior cerebellar arteries are normal. Venous sinuses: Normal Anatomic variants: None Delayed phase: Old right insular/ external capsule lacunar infarct. No midline shift or mass effect. IMPRESSION: 1. Diffuse severe narrowing of the right internal carotid artery along nearly its entire length, beginning just distal to its origin. The intracranial  right internal carotid artery is occluded at the proximal clinoid segment. 2. Attenuated vascularity throughout the right MCA distribution, possibly supplied by pial collaterals. 3. Non opacification of the right anterior cerebral artery A1 segment. The A2 segments of both anterior cerebral arteries are supplied from the left ICA with preserved flow across the anterior communicating artery. 4. Moderate stenosis of the left MCA M1 segment. 5. Incompletely visualized 3 mm right upper lobe pulmonary nodule. No follow-up needed if patient is low-risk. Non-contrast chest CT can be considered in 12 months if patient is high-risk. This recommendation follows the consensus statement: Guidelines for Management of Incidental Pulmonary Nodules Detected on CT Images:From the Fleischner Society 2017; published online before print (10.1148/radiol.IJ:2314499). Electronically Signed   By: Ulyses Jarred M.D.   On: 07/09/2016 13:56   Mr Virgel Paling Wo Contrast  07/08/2016  CLINICAL DATA:  50 year old diabetic hypertensive male with acute onset of arm clumsiness yesterday. Residual mild weakness and numbness left hand. Subsequent encounter. EXAM: MRI HEAD WITHOUT CONTRAST MRA HEAD WITHOUT CONTRAST TECHNIQUE: Multiplanar, multiecho pulse sequences of the brain and surrounding structures were obtained without intravenous contrast. Angiographic images of the head were obtained using MRA technique without contrast. COMPARISON:  07/08/2016 CT. FINDINGS: MRI HEAD FINDINGS Several acute infarcts involving frontal and parietal lobes bilaterally. Majority of these are small with largest acute infarct posterior left frontal lobe measuring up to 1.4 cm. No associated hemorrhage with these acute infarcts. Remote right lenticular nucleus, caudate, corona radiata and centrum semiovale infarcts with blood-stained encephalomalacia. Subsequent mild dilation of the right lateral ventricle. Abnormal appearance of the right internal carotid artery as  discussed below. Moderate chronic microvascular changes. Global atrophy without hydrocephalus. No acute orbital abnormality. No intracranial mass lesion noted on this unenhanced exam. Paranasal sinus mucosal thickening most notable maxillary sinuses measuring up to 1 cm on right and complex and measuring up to 1 cm on the left. Small mega cisterna magna incidentally noted. 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 Minimal signal within portion of the right internal cavernous/supraclinoid segment may represent poor collateral flow. No flow seen within the right carotid terminus or right middle cerebral artery. Moderate narrowing left internal carotid artery cavernous segment junction with the supraclinoid segment. Mild to moderate narrowing proximal M1 segment left middle cerebral artery with early branching just beyond this region. Left anterior cerebral artery with collateral flow to the A2 segment of the right anterior cerebral artery. Left vertebral artery is dominant. Attenuated right vertebral artery. Irregularity and areas of narrowing involving portions of the posterior inferior cerebellar artery and anterior inferior cerebellar artery bilaterally. No high-grade stenosis of the basilar artery. Mild irregularity superior cerebellar artery greater on left. Mild moderate narrowing of portions of the posterior cerebral artery mid to distal branches. IMPRESSION: MRI HEAD Several acute infarcts involving frontal and parietal lobes bilaterally. Majority of these are small with largest acute infarct posterior left frontal lobe measuring up  to 1.4 cm. No associated hemorrhage with these acute infarcts. Remote right lenticular nucleus, caudate, corona radiata and centrum semiovale infarcts with blood-stained encephalomalacia. Subsequent mild dilation of the right lateral ventricle. Abnormal appearance of the right internal carotid artery as discussed below. Moderate chronic microvascular changes.  Global atrophy without hydrocephalus. Paranasal sinus mucosal thickening most notable maxillary sinuses measuring up to 1 cm on right and complex and measuring up to 1 cm on the left. 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 Minimal signal within portion of the right internal cavernous/supraclinoid segment may represent poor collateral flow. No flow seen within the right carotid terminus or right middle cerebral artery. Moderate narrowing left internal carotid artery cavernous segment junction with the supraclinoid segment. Mild to moderate narrowing proximal M1 segment left middle cerebral artery with early branching just beyond this region. Left anterior cerebral artery with collateral flow to the A2 segment of the right anterior cerebral artery. Left vertebral artery is dominant. Attenuated right vertebral artery. Irregularity and areas of narrowing involving portions of the posterior inferior cerebellar artery and anterior inferior cerebellar artery bilaterally. No high-grade stenosis of the basilar artery. Mild irregularity superior cerebellar artery greater on left. Mild to moderate narrowing of portions of the posterior cerebral artery mid to distal branches. Electronically Signed   By: Genia Del M.D.   On: 07/08/2016 21:03   Mr Brain Wo Contrast  07/08/2016  CLINICAL DATA:  50 year old diabetic hypertensive male with acute onset of arm clumsiness yesterday. Residual mild weakness and numbness left hand. Subsequent encounter. EXAM: MRI HEAD WITHOUT CONTRAST MRA HEAD WITHOUT CONTRAST TECHNIQUE: Multiplanar, multiecho pulse sequences of the brain and surrounding structures were obtained without intravenous contrast. Angiographic images of the head were obtained using MRA technique without contrast. COMPARISON:  07/08/2016 CT. FINDINGS: MRI HEAD FINDINGS Several acute infarcts involving frontal and parietal lobes bilaterally. Majority of these are small with largest acute infarct posterior left  frontal lobe measuring up to 1.4 cm. No associated hemorrhage with these acute infarcts. Remote right lenticular nucleus, caudate, corona radiata and centrum semiovale infarcts with blood-stained encephalomalacia. Subsequent mild dilation of the right lateral ventricle. Abnormal appearance of the right internal carotid artery as discussed below. Moderate chronic microvascular changes. Global atrophy without hydrocephalus. No acute orbital abnormality. No intracranial mass lesion noted on this unenhanced exam. Paranasal sinus mucosal thickening most notable maxillary sinuses measuring up to 1 cm on right and complex and measuring up to 1 cm on the left. Small mega cisterna magna incidentally noted. 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 Minimal signal within portion of the right internal cavernous/supraclinoid segment may represent poor collateral flow. No flow seen within the right carotid terminus or right middle cerebral artery. Moderate narrowing left internal carotid artery cavernous segment junction with the supraclinoid segment. Mild to moderate narrowing proximal M1 segment left middle cerebral artery with early branching just beyond this region. Left anterior cerebral artery with collateral flow to the A2 segment of the right anterior cerebral artery. Left vertebral artery is dominant. Attenuated right vertebral artery. Irregularity and areas of narrowing involving portions of the posterior inferior cerebellar artery and anterior inferior cerebellar artery bilaterally. No high-grade stenosis of the basilar artery. Mild irregularity superior cerebellar artery greater on left. Mild moderate narrowing of portions of the posterior cerebral artery mid to distal branches. IMPRESSION: MRI HEAD Several acute infarcts involving frontal and parietal lobes bilaterally. Majority of these are small with largest acute infarct posterior left frontal lobe measuring  up to 1.4 cm. No associated  hemorrhage with these acute infarcts. Remote right lenticular nucleus, caudate, corona radiata and centrum semiovale infarcts with blood-stained encephalomalacia. Subsequent mild dilation of the right lateral ventricle. Abnormal appearance of the right internal carotid artery as discussed below. Moderate chronic microvascular changes. Global atrophy without hydrocephalus. Paranasal sinus mucosal thickening most notable maxillary sinuses measuring up to 1 cm on right and complex and measuring up to 1 cm on the left. 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 Minimal signal within portion of the right internal cavernous/supraclinoid segment may represent poor collateral flow. No flow seen within the right carotid terminus or right middle cerebral artery. Moderate narrowing left internal carotid artery cavernous segment junction with the supraclinoid segment. Mild to moderate narrowing proximal M1 segment left middle cerebral artery with early branching just beyond this region. Left anterior cerebral artery with collateral flow to the A2 segment of the right anterior cerebral artery. Left vertebral artery is dominant. Attenuated right vertebral artery. Irregularity and areas of narrowing involving portions of the posterior inferior cerebellar artery and anterior inferior cerebellar artery bilaterally. No high-grade stenosis of the basilar artery. Mild irregularity superior cerebellar artery greater on left. Mild to moderate narrowing of portions of the posterior cerebral artery mid to distal branches. Electronically Signed   By: Genia Del M.D.   On: 07/08/2016 21:03  2-D echo 07/09/16: Study Conclusions  - Left ventricle: The cavity size was normal. Wall thickness was  increased in a pattern of moderate LVH. Systolic function was  mildly to moderately reduced. The estimated ejection fraction was  in the range of 40% to 45%. Diffuse hypokinesis. Features are  consistent with a pseudonormal left  ventricular filling pattern,  with concomitant abnormal relaxation and increased filling  pressure (grade 2 diastolic dysfunction). - Right ventricle: Systolic function was mildly reduced.   Carotid Dopplers 07/09/16: Preliminary report as below Carotid Duplex Completed. Right ICA demonstrated abnormal pre occlusive waveforms consistent with known occlusive disease. Left ICA demonstrated normal patency with no evidence of stenosis. Bilateral vertebral arteries demonstrated antegrade flow.     Subjective: States that he almost feels back to his usual self. Occasional and intermittent left upper extremity clumsiness but much improved.   Discharge Exam:  Filed Vitals:   07/10/16 0157 07/10/16 0527 07/10/16 1003 07/10/16 1309  BP: 149/89 128/72 155/77 153/86  Pulse: 72 70 81 75  Temp: 98.6 F (37 C) 98 F (36.7 C) 98.5 F (36.9 C) 97.9 F (36.6 C)  TempSrc: Oral Oral Oral Oral  Resp: 20  20 20   Height:      Weight:      SpO2: 100% 98% 98% 99%    General: Pt lying comfortably in bed & appears in no obvious distress. Cardiovascular: S1 & S2 heard, RRR, S1/S2 +. No murmurs, rubs, gallops or clicks. No JVD or pedal edema.Telemetry: Sinus rhythm.  Respiratory: Clear to auscultation without wheezing, rhonchi or crackles. No increased work of breathing. Abdominal:  Non distended, non tender & soft. No organomegaly or masses appreciated. Normal bowel sounds heard. CNS: Alert and oriented. ? Mild right eye ptosis. Mild left lower facial asymmetry. No dysarthria. Grade 5 x 5 power bilaterally. Extremities: no edema, no cyanosis    The results of significant diagnostics from this hospitalization (including imaging, microbiology, ancillary and laboratory) are listed below for reference.     Microbiology: No results found for this or any previous visit (from the past 240 hour(s)).   Labs: BNP (  last 3 results) No results for input(s): BNP in the last 8760 hours. Basic Metabolic  Panel:  Recent Labs Lab 07/08/16 1300 07/08/16 1333 07/10/16 0620  NA 132* 134* 136  K 4.3 4.2 4.0  CL 99* 96* 102  CO2 23  --  27  GLUCOSE 434* 452* 228*  BUN 11 13 9   CREATININE 1.03 0.90 0.81  CALCIUM 9.6  --  9.0  MG  --   --  1.8   Liver Function Tests:  Recent Labs Lab 07/08/16 1300  AST 21  ALT 11*  ALKPHOS 79  BILITOT 1.1  PROT 7.3  ALBUMIN 4.1   No results for input(s): LIPASE, AMYLASE in the last 168 hours. No results for input(s): AMMONIA in the last 168 hours. CBC:  Recent Labs Lab 07/08/16 1300 07/08/16 1333  WBC 9.1  --   NEUTROABS 6.6  --   HGB 13.5 15.3  HCT 39.0 45.0  MCV 84.1  --   PLT 271  --    Cardiac Enzymes:  Recent Labs Lab 07/08/16 2058  TROPONINI 0.03*   BNP: Invalid input(s): POCBNP CBG:  Recent Labs Lab 07/09/16 1116 07/09/16 1634 07/09/16 2128 07/10/16 0557 07/10/16 1148  GLUCAP 213* 135* 137* 228* 273*   D-Dimer No results for input(s): DDIMER in the last 72 hours. Hgb A1c  Recent Labs  07/09/16 0627  HGBA1C 11.0*   Lipid Profile  Recent Labs  07/09/16 0627  CHOL 185  HDL 24*  LDLCALC UNABLE TO CALCULATE IF TRIGLYCERIDE OVER 400 mg/dL  TRIG 634*  CHOLHDL 7.7   Thyroid function studies No results for input(s): TSH, T4TOTAL, T3FREE, THYROIDAB in the last 72 hours.  Invalid input(s): FREET3 Anemia work up No results for input(s): VITAMINB12, FOLATE, FERRITIN, TIBC, IRON, RETICCTPCT in the last 72 hours. Urinalysis    Component Value Date/Time   COLORURINE YELLOW 07/08/2016 2217   COLORURINE Straw 11/04/2013 1144   APPEARANCEUR CLEAR 07/08/2016 2217   APPEARANCEUR Clear 11/04/2013 1144   LABSPEC 1.030 07/08/2016 2217   LABSPEC 1.014 11/04/2013 1144   PHURINE 6.0 07/08/2016 2217   PHURINE 5.0 11/04/2013 1144   GLUCOSEU >1000* 07/08/2016 2217   GLUCOSEU >=500 11/04/2013 1144   HGBUR TRACE* 07/08/2016 2217   HGBUR Negative 11/04/2013 1144   HGBUR negative 05/30/2010 0930   BILIRUBINUR  NEGATIVE 07/08/2016 2217   BILIRUBINUR Negative 11/04/2013 1144   KETONESUR NEGATIVE 07/08/2016 2217   KETONESUR Negative 11/04/2013 1144   PROTEINUR 100* 07/08/2016 2217   PROTEINUR Negative 11/04/2013 1144   UROBILINOGEN 1.0 04/01/2015 2038   NITRITE NEGATIVE 07/08/2016 2217   NITRITE Negative 11/04/2013 1144   LEUKOCYTESUR NEGATIVE 07/08/2016 2217   LEUKOCYTESUR Negative 11/04/2013 1144   Sepsis Labs Invalid input(s): PROCALCITONIN,  WBC,  LACTICIDVEN    Time coordinating discharge: Over 30 minutes  SIGNED:  Vernell Leep, MD, FACP, FHM. [facility] Pager [phone]  If 7PM-7AM, please contact night-coverage [email or web address] Password [email or web address] 07/10/2016, 3:00 PM

## 2016-07-18 ENCOUNTER — Telehealth: Payer: Self-pay

## 2016-07-18 NOTE — Telephone Encounter (Signed)
Clld pt - LMOVMTC to schedule HFU appt  - CVA with PCP.

## 2016-08-25 ENCOUNTER — Other Ambulatory Visit (HOSPITAL_COMMUNITY): Payer: Self-pay | Admitting: Internal Medicine

## 2016-09-09 ENCOUNTER — Encounter: Payer: Self-pay | Admitting: Family Medicine

## 2016-09-09 ENCOUNTER — Ambulatory Visit (INDEPENDENT_AMBULATORY_CARE_PROVIDER_SITE_OTHER): Payer: Self-pay | Admitting: Family Medicine

## 2016-09-09 VITALS — BP 137/79 | HR 82 | Temp 97.4°F | Ht 71.5 in | Wt 230.0 lb

## 2016-09-09 DIAGNOSIS — Z23 Encounter for immunization: Secondary | ICD-10-CM

## 2016-09-09 DIAGNOSIS — I5042 Chronic combined systolic (congestive) and diastolic (congestive) heart failure: Secondary | ICD-10-CM

## 2016-09-09 DIAGNOSIS — I509 Heart failure, unspecified: Secondary | ICD-10-CM

## 2016-09-09 DIAGNOSIS — I10 Essential (primary) hypertension: Secondary | ICD-10-CM

## 2016-09-09 DIAGNOSIS — E1165 Type 2 diabetes mellitus with hyperglycemia: Secondary | ICD-10-CM

## 2016-09-09 MED ORDER — CARVEDILOL 25 MG PO TABS: 25.0000 mg | ORAL_TABLET | Freq: Two times a day (BID) | ORAL | 6 refills | Status: DC

## 2016-09-09 MED ORDER — FUROSEMIDE 40 MG PO TABS: 40.0000 mg | ORAL_TABLET | Freq: Every day | ORAL | 6 refills | Status: DC

## 2016-09-09 MED ORDER — ASPIRIN 81 MG PO TBEC: 81.0000 mg | DELAYED_RELEASE_TABLET | Freq: Every day | ORAL | 6 refills | Status: DC

## 2016-09-09 MED ORDER — LISINOPRIL 20 MG PO TABS
20.0000 mg | ORAL_TABLET | Freq: Two times a day (BID) | ORAL | 6 refills | Status: DC
Start: 2016-09-09 — End: 2016-10-15

## 2016-09-09 MED ORDER — METFORMIN HCL 500 MG PO TABS: 500.0000 mg | ORAL_TABLET | Freq: Two times a day (BID) | ORAL | 0 refills | Status: DC

## 2016-09-09 MED ORDER — SPIRONOLACTONE 25 MG PO TABS: 25.0000 mg | ORAL_TABLET | Freq: Every day | ORAL | 6 refills | Status: DC

## 2016-09-09 MED ORDER — ATORVASTATIN CALCIUM 40 MG PO TABS: 40.0000 mg | ORAL_TABLET | Freq: Every day | ORAL | 2 refills | Status: DC

## 2016-09-09 MED ORDER — INSULIN NPH ISOPHANE & REGULAR (70-30) 100 UNIT/ML ~~LOC~~ SUSP: 10.0000 [IU] | Freq: Two times a day (BID) | SUBCUTANEOUS | 0 refills | Status: DC

## 2016-09-09 NOTE — Patient Instructions (Signed)
Have refilled medications.

## 2016-09-10 MED FILL — ?SPIRONOLACTONE 25 MG TABLE: 25 MG | 30 days supply | Qty: 30 | Fill #0

## 2016-09-10 MED FILL — ?ATORVASTATIN 40MG TABLET: 40 | 30 days supply | Qty: 30 | Fill #0

## 2016-09-10 MED FILL — ?LISINOPRIL 20 MG TABLET: 20 | 30 days supply | Qty: 60 | Fill #0

## 2016-09-10 MED FILL — ?METFORMIN HCL 500MG TABLET: 500 | 30 days supply | Qty: 60 | Fill #0

## 2016-09-10 MED FILL — NOVOLIN 70/30 100 UNITS/ML: (70-30) 100 | 50 days supply | Qty: 10 | Fill #0

## 2016-09-10 MED FILL — CARVEDILOL 25 MG TABLET: 25 | 30 days supply | Qty: 60 | Fill #0

## 2016-09-10 MED FILL — ?FUROSEMIDE 40 MG TABLET: 40 | 30 days supply | Qty: 30 | Fill #0

## 2016-09-11 NOTE — Progress Notes (Signed)
Jared Hart, is a 50 y.o. male  [id number]  [id number]  DOB - [birth year]  CC:  Chief Complaint  Patient presents with  . New Patient (Initial Visit)    no insurance, discharged from hospital 7/17 this is first visit since discharge, needs all meds refilled to [facility], has been out of all meds except insulin for about 1 month, would like the flu vaccine       HPI: Jared Hart is a 50 y.o. male here to establish care. He was in hospital in mid July and was referred here to establish primary care.  He has a history of CHF, HTN, Diabetes, hypercholesterolemia, CVA and colon cancer. He needs refills on all medications. He has been out of everything for about a month except for insulin.He has been a patient at [facility]. His medications include plavix, atorvostatin, coreg,lasix, fenofibrate, metformin NPH insulin and lisinopril. His last A1C was 11.0 in July. He reports he is careful with his diet eats mostly vegetables chicken and avoids carbs. He reports exercising 3 times a weeki.   Health Maintenance: He is to get a flu shot today. He is to return in about a month to address further health maintenance.  No Known Allergies Past Medical History:  Diagnosis Date  . CHF (congestive heart failure) ([place])   . Colon cancer ([place])   . Diabetes mellitus   . HTN (hypertension)   . Hypercholesteremia   . Stroke [facility])    Current Outpatient Prescriptions on File Prior to Visit  Medication Sig Dispense Refill  . Insulin Syringe-Needle U-100 (INSULIN SYRINGE .5CC/31GX5/16") 31G X 5/16" 0.5 ML MISC Use as per directions. 100 each 0  . clopidogrel (PLAVIX) 75 MG tablet Take 1 tablet (75 mg total) by mouth daily. (Patient not taking: Reported on 09/09/2016) 30 tablet 2  . fenofibrate 160 MG tablet Take 1 tablet (160 mg total) by mouth daily. (Patient not taking: Reported on 09/09/2016) 30 tablet 2   No current facility-administered medications on file prior to visit.    Family History  Problem  Relation Age of Onset  . Diabetes    . Cancer    . Hypertension    . Hypertension Mother   . Diabetes Mother   . Cancer Mother   . Hypertension Father    Social History   Social History  . Marital status: Married    Spouse name: N/A  . Number of children: N/A  . Years of education: N/A   Occupational History  . Not on file.   Social History Main Topics  . Smoking status: Never Smoker  . Smokeless tobacco: Current User    Types: Snuff  . Alcohol use No  . Drug use: No  . Sexual activity: Not on file   Other Topics Concern  . Not on file   Social History Narrative  . No narrative on file    Review of Systems: Constitutional: Negative Skin: Negative HENT: Negative except for runny nose  Eyes: Negative  Neck: Negative Respiratory: Negative Cardiovascular: Negative Gastrointestinal: Negative except for occ constipation Genitourinary: Negative  Musculoskeletal: Negative   Neurological: Negative for Hematological: Negative  Psychiatric/Behavioral: Negative    Objective:   Vitals:   09/09/16 1342  BP: 137/79  Pulse: 82  Temp: 97.4 F (36.3 C)    Physical Exam: Constitutional: Patient appears well-developed and well-nourished. No distress. HENT: Normocephalic, atraumatic, External right and left ear normal. Oropharynx is clear and moist.  Eyes: Conjunctivae and EOM are normal. PERRLA, no  scleral icterus. Neck: Normal ROM. Neck supple. No lymphadenopathy, No thyromegaly. CVS: RRR, S1/S2 +, no murmurs, no gallops, no rubs Pulmonary: Effort and breath sounds normal, no stridor, rhonchi, wheezes, rales.  Abdominal: Soft. Normoactive BS,, no distension, tenderness, rebound or guarding.  Musculoskeletal: Normal range of motion. No edema and no tenderness.  Neuro: Alert.Normal muscle tone coordination. Non-focal Skin: Skin is warm and dry. No rash noted. Not diaphoretic. No erythema. No pallor. Psychiatric: Normal mood and affect. Behavior, judgment, thought  content normal.  Lab Results  Component Value Date   WBC 9.1 07/08/2016   HGB 15.3 07/08/2016   HCT 45.0 07/08/2016   MCV 84.1 07/08/2016   PLT 271 07/08/2016   Lab Results  Component Value Date   CREATININE 0.81 07/10/2016   BUN 9 07/10/2016   NA 136 07/10/2016   K 4.0 07/10/2016   CL 102 07/10/2016   CO2 27 07/10/2016    Lab Results  Component Value Date   HGBA1C 11.0 (H) 07/09/2016   Lipid Panel     Component Value Date/Time   CHOL 185 07/09/2016 0627   TRIG 634 (H) 07/09/2016 0627   HDL 24 (L) 07/09/2016 0627   CHOLHDL 7.7 07/09/2016 0627   VLDL UNABLE TO CALCULATE IF TRIGLYCERIDE OVER 400 mg/dL 07/09/2016 0627   LDLCALC UNABLE TO CALCULATE IF TRIGLYCERIDE OVER 400 mg/dL 07/09/2016 0627       Assessment and plan:   1. Chronic combined systolic and diastolic congestive heart failure (HCC)  - aspirin 81 MG EC tablet; Take 1 tablet (81 mg total) by mouth daily.  Dispense: 30 tablet; Refill: 6  2. Type 2 diabetes mellitus with hyperglycemia, unspecified long term insulin use status (HCC)  - metFORMIN (GLUCOPHAGE) 500 MG tablet; Take 1 tablet (500 mg total) by mouth 2 (two) times daily with a meal. Metformin held due to IV dye given on 07/09/16. Resume on 07/12/16.  Dispense: 60 tablet; Refill: 0  3. Congestive heart failure, unspecified congestive heart failure chronicity, unspecified congestive heart failure type (HCC)  - furosemide (LASIX) 40 MG tablet; Take 1 tablet (40 mg total) by mouth daily.  Dispense: 30 tablet; Refill: 6  4. HYPERTENSION, BENIGN ESSENTIAL  - lisinopril (PRINIVIL,ZESTRIL) 20 MG tablet; Take 1 tablet (20 mg total) by mouth 2 (two) times daily.  Dispense: 60 tablet; Refill: 6  5. Needs flu shot  - Flu Vaccine QUAD 36+ mos PF IM (Fluarix & Fluzone Quad PF)   Return in about 4 weeks (around 10/07/2016).  The patient was given clear instructions to go to ER or return to medical center if symptoms don't improve, worsen or new problems  develop. The patient verbalized understanding.    Micheline Chapman FNP  09/11/2016, 12:39 PM

## 2016-10-08 ENCOUNTER — Ambulatory Visit (INDEPENDENT_AMBULATORY_CARE_PROVIDER_SITE_OTHER): Payer: Self-pay | Admitting: Family Medicine

## 2016-10-08 ENCOUNTER — Encounter: Payer: Self-pay | Admitting: Family Medicine

## 2016-10-08 VITALS — BP 154/75 | HR 80 | Temp 98.1°F | Resp 16 | Ht 71.5 in | Wt 235.0 lb

## 2016-10-08 DIAGNOSIS — I10 Essential (primary) hypertension: Secondary | ICD-10-CM

## 2016-10-08 DIAGNOSIS — E119 Type 2 diabetes mellitus without complications: Secondary | ICD-10-CM

## 2016-10-08 DIAGNOSIS — E785 Hyperlipidemia, unspecified: Secondary | ICD-10-CM

## 2016-10-08 DIAGNOSIS — Z23 Encounter for immunization: Secondary | ICD-10-CM

## 2016-10-08 LAB — COMPLETE METABOLIC PANEL WITH GFR
ALK PHOS: 84 U/L (ref 40–115)
ALT: 9 U/L (ref 9–46)
AST: 17 U/L (ref 10–40)
Albumin: 4.4 g/dL (ref 3.6–5.1)
BILIRUBIN TOTAL: 0.5 mg/dL (ref 0.2–1.2)
BUN: 14 mg/dL (ref 7–25)
CALCIUM: 9.4 mg/dL (ref 8.6–10.3)
CO2: 26 mmol/L (ref 20–31)
CREATININE: 0.9 mg/dL (ref 0.60–1.35)
Chloride: 103 mmol/L (ref 98–110)
GFR, Est African American: >89 mL/min (ref >=60)
GFR, Est Non African American: >89 mL/min (ref >=60)
GLUCOSE: 209 mg/dL — AB (ref 65–99)
Potassium: 4.3 mmol/L (ref 3.5–5.3)
Sodium: 137 mmol/L (ref 135–146)
TOTAL PROTEIN: 6.8 g/dL (ref 6.1–8.1)

## 2016-10-08 LAB — CBC WITH DIFFERENTIAL/PLATELET
BASOS ABS: 0 {cells}/uL (ref 0–200)
Basophils Relative: 0 %
EOS PCT: 2 %
Eosinophils Absolute: 152 cells/uL (ref 15–500)
HCT: 36.1 % — ABNORMAL LOW (ref 38.5–50.0)
HEMOGLOBIN: 12.1 g/dL — AB (ref 13.2–17.1)
LYMPHS PCT: 34 %
Lymphs Abs: 2584 cells/uL (ref 850–3900)
MCH: 29 pg (ref 27.0–33.0)
MCHC: 33.5 g/dL (ref 32.0–36.0)
MCV: 86.6 fL (ref 80.0–100.0)
MONOS PCT: 7 %
MPV: 9.2 fL (ref 7.5–12.5)
Monocytes Absolute: 532 cells/uL (ref 200–950)
NEUTROS PCT: 57 %
Neutro Abs: 4332 cells/uL (ref 1500–7800)
PLATELETS: 251 10*3/uL (ref 140–400)
RBC: 4.17 MIL/uL — ABNORMAL LOW (ref 4.20–5.80)
RDW: 13 % (ref 11.0–15.0)
WBC: 7.6 10*3/uL (ref 3.8–10.8)

## 2016-10-08 LAB — LIPID PANEL
CHOLESTEROL: 102 mg/dL — AB (ref 125–200)
HDL: 27 mg/dL — ABNORMAL LOW (ref >=40)
LDL Cholesterol: 45 mg/dL (ref <130)
Total CHOL/HDL Ratio: 3.8 Ratio (ref <=5.0)
Triglycerides: 149 mg/dL (ref <150)
VLDL: 30 mg/dL (ref <30)

## 2016-10-08 LAB — POCT GLYCOSYLATED HEMOGLOBIN (HGB A1C): Hemoglobin A1C: 8.5

## 2016-10-08 MED ORDER — METFORMIN HCL 1000 MG PO TABS: 1000.0000 mg | ORAL_TABLET | Freq: Two times a day (BID) | ORAL | 1 refills | Status: DC

## 2016-10-08 MED ORDER — PNEUMOCOCCAL 13-VAL CONJ VACC IM SUSP
0.5000 mL | INTRAMUSCULAR | Status: AC
  Administered 2016-10-08: 0.5 mL via INTRAMUSCULAR

## 2016-10-08 MED FILL — metFORMIN HCL 1000 MG TABS: 1000 | 30 days supply | Qty: 60 | Fill #0

## 2016-10-08 NOTE — Patient Instructions (Signed)
Continue diabetic diet Am increasing metformin from 500 twice a day to 1000 twice a day. Your A1c is better but still not where we would like it.

## 2016-10-08 NOTE — Progress Notes (Signed)
Jared Hart, is a 50 y.o. male  [id number]  [id number]  DOB - 1966-04-22  CC:  Chief Complaint  Patient presents with  . Follow-up       HPI: [name] is a 50 y.o. male here for follow-up chronic conditions. He has a history of diabetes, hypertension, cardiomyopathy with CHF, TIA and colon cancer. He is on Plavix and finofibrate here per cardiology or neurologist. His other medications are prescribed here. He is currently on NPH insulin 10 units bid and metformin 500 mg bid. His A1C today is 8.? Down from 11+ 3 months ago. He is on aldactone, lasix, carvedilol and lisinopril for hypertension and CHF. Doses are in medication list. He reports his colon cancer was completely cleared but it has been 6 years since a follow-up colonoscopy. His original BP was 154/75, Repeat 148/80. He reports following a diabetic diet 6 out of 7 days and reports exercising regularly. He denies cigarette, alcohol or drug abuse. He occ.chews tobacco.   Health Maintenance: He is in need of Prevnar and Tdap. He needs a dilated eye exam and foot exam. Micral is up to date.   No Known Allergies Past Medical History:  Diagnosis Date  . CHF (congestive heart failure) ([place])   . Colon cancer ([place])   . Diabetes mellitus   . HTN (hypertension)   . Hypercholesteremia   . Stroke [facility])    Current Outpatient Prescriptions on File Prior to Visit  Medication Sig Dispense Refill  . aspirin 81 MG EC tablet Take 1 tablet (81 mg total) by mouth daily. 30 tablet 6  . atorvastatin (LIPITOR) 40 MG tablet Take 1 tablet (40 mg total) by mouth daily at 6 PM. 30 tablet 2  . carvedilol (COREG) 25 MG tablet Take 1 tablet (25 mg total) by mouth 2 (two) times daily with a meal. 60 tablet 6  . furosemide (LASIX) 40 MG tablet Take 1 tablet (40 mg total) by mouth daily. 30 tablet 6  . insulin NPH-regular Human (NOVOLIN 70/30) (70-30) 100 UNIT/ML injection Inject 10 Units into the skin 2 (two) times daily with a meal. May use  [employer] brand. 10 mL 0  . Insulin Syringe-Needle U-100 (INSULIN SYRINGE .5CC/31GX5/16") 31G X 5/16" 0.5 ML MISC Use as per directions. 100 each 0  . lisinopril (PRINIVIL,ZESTRIL) 20 MG tablet Take 1 tablet (20 mg total) by mouth 2 (two) times daily. 60 tablet 6  . spironolactone (ALDACTONE) 25 MG tablet Take 1 tablet (25 mg total) by mouth daily. 30 tablet 6  . clopidogrel (PLAVIX) 75 MG tablet Take 1 tablet (75 mg total) by mouth daily. (Patient not taking: Reported on 10/08/2016) 30 tablet 2  . fenofibrate 160 MG tablet Take 1 tablet (160 mg total) by mouth daily. (Patient not taking: Reported on 10/08/2016) 30 tablet 2   No current facility-administered medications on file prior to visit.    Family History  Problem Relation Age of Onset  . Diabetes    . Cancer    . Hypertension    . Hypertension Mother   . Diabetes Mother   . Cancer Mother   . Hypertension Father    Social History   Social History  . Marital status: Married    Spouse name: N/A  . Number of children: N/A  . Years of education: N/A   Occupational History  . Not on file.   Social History Main Topics  . Smoking status: Never Smoker  . Smokeless tobacco: Current User  Types: Snuff  . Alcohol use No  . Drug use: No  . Sexual activity: Not on file   Other Topics Concern  . Not on file   Social History Narrative  . No narrative on file    Review of Systems: Constitutional: + for cold sensitivity Skin: Negative HENT: + runny nose Eyes: Negative  Neck: Negative Respiratory: Negative Cardiovascular: Negative Gastrointestinal: Negative Genitourinary: Negative  Musculoskeletal: Negative   Neurological: Negative for Hematological: Negative  Psychiatric/Behavioral: Negative    Objective:   Vitals:   10/08/16 1316  BP: (!) 154/75  Pulse: 80  Resp: 16  Temp: 98.1 F (36.7 C)    Physical Exam: Constitutional: Patient appears well-developed and well-nourished. No distress. HENT:  Normocephalic, atraumatic, External right and left ear normal. Oropharynx is clear and moist.  Eyes: Conjunctivae and EOM are normal. PERRLA, no scleral icterus. Neck: Normal ROM. Neck supple. No lymphadenopathy, No thyromegaly. CVS: RRR, S1/S2 +, no murmurs, no gallops, no rubs Pulmonary: Effort and breath sounds normal, no stridor, rhonchi, wheezes, rales.  Abdominal: Soft. Normoactive BS,, no distension, tenderness, rebound or guarding.  Musculoskeletal: Normal range of motion. No edema and no tenderness.  Neuro: Alert.Normal muscle tone coordination. Non-focal Skin: Skin is warm and dry. No rash noted. Not diaphoretic. No erythema. No pallor. Psychiatric: Normal mood and affect. Behavior, judgment, thought content normal.  Lab Results  Component Value Date   WBC 9.1 07/08/2016   HGB 15.3 07/08/2016   HCT 45.0 07/08/2016   MCV 84.1 07/08/2016   PLT 271 07/08/2016   Lab Results  Component Value Date   CREATININE 0.81 07/10/2016   BUN 9 07/10/2016   NA 136 07/10/2016   K 4.0 07/10/2016   CL 102 07/10/2016   CO2 27 07/10/2016    Lab Results  Component Value Date   HGBA1C 8.5 10/08/2016   Lipid Panel     Component Value Date/Time   CHOL 185 07/09/2016 0627   TRIG 634 (H) 07/09/2016 0627   HDL 24 (L) 07/09/2016 0627   CHOLHDL 7.7 07/09/2016 0627   VLDL UNABLE TO CALCULATE IF TRIGLYCERIDE OVER 400 mg/dL 07/09/2016 0627   LDLCALC UNABLE TO CALCULATE IF TRIGLYCERIDE OVER 400 mg/dL 07/09/2016 0627       Assessment and plan:   1. Need for Tdap vaccination -Tdap given today.  2. Type 2 diabetes mellitus without complication, without long-term current use of insulin (HCC)  - POCT glycosylated hemoglobin (Hb A1C) - COMPLETE METABOLIC PANEL WITH GFR - CBC with Differential  3. Need for prophylactic vaccination against Streptococcus pneumoniae (pneumococcus)  - pneumococcal 13-valent conjugate vaccine (PREVNAR 13) injection 0.5 mL; Inject 0.5 mLs into the muscle tomorrow  at 10 am.  4. Hyperlipidemia, unspecified hyperlipidemia type  - Lipid panel  5. HYPERTENSION, BENIGN ESSENTIAL -Continue current medications. Will refill when needed.  (See list).    Return in about 3 months (around 01/08/2017).  The patient was given clear instructions to go to ER or return to medical center if symptoms don't improve, worsen or new problems develop. The patient verbalized understanding.    Micheline Chapman FNP  10/08/2016, 1:57 PM

## 2016-10-14 ENCOUNTER — Other Ambulatory Visit: Payer: Self-pay | Admitting: Family Medicine

## 2016-10-15 ENCOUNTER — Telehealth: Payer: Self-pay

## 2016-10-15 DIAGNOSIS — I509 Heart failure, unspecified: Secondary | ICD-10-CM

## 2016-10-15 DIAGNOSIS — I5042 Chronic combined systolic (congestive) and diastolic (congestive) heart failure: Secondary | ICD-10-CM

## 2016-10-15 DIAGNOSIS — I10 Essential (primary) hypertension: Secondary | ICD-10-CM

## 2016-10-15 MED ORDER — SPIRONOLACTONE 25 MG PO TABS: 25.0000 mg | ORAL_TABLET | Freq: Every day | ORAL | 6 refills | Status: DC

## 2016-10-15 MED ORDER — "INSULIN SYRINGE 31G X 5/16"" 0.5 ML MISC": 0 refills | Status: DC

## 2016-10-15 MED ORDER — LISINOPRIL 20 MG PO TABS: 20.0000 mg | ORAL_TABLET | Freq: Two times a day (BID) | ORAL | 6 refills | Status: DC

## 2016-10-15 MED ORDER — CARVEDILOL 25 MG PO TABS: 25.0000 mg | ORAL_TABLET | Freq: Two times a day (BID) | ORAL | 6 refills | Status: DC

## 2016-10-15 MED ORDER — FUROSEMIDE 40 MG PO TABS: 40.0000 mg | ORAL_TABLET | Freq: Every day | ORAL | 6 refills | Status: DC

## 2016-10-15 MED ORDER — ASPIRIN 81 MG PO TBEC: 81.0000 mg | DELAYED_RELEASE_TABLET | Freq: Every day | ORAL | 6 refills | Status: DC

## 2016-10-15 MED ORDER — ATORVASTATIN CALCIUM 40 MG PO TABS: 40.0000 mg | ORAL_TABLET | Freq: Every day | ORAL | 2 refills | Status: DC

## 2016-10-15 MED ORDER — INSULIN NPH ISOPHANE & REGULAR (70-30) 100 UNIT/ML ~~LOC~~ SUSP: SUBCUTANEOUS | 3 refills | Status: DC

## 2016-10-15 MED FILL — NOVOLIN 70/30 100 UNITS/ML: (70-30) 100 | 28 days supply | Qty: 10 | Fill #0

## 2016-10-15 MED FILL — CARVEDILOL 25 MG TABLET: 25 | 30 days supply | Qty: 60 | Fill #0

## 2016-10-15 MED FILL — ?SPIRONOLACTONE 25 MG TABLE: 25 MG | 30 days supply | Qty: 30 | Fill #0

## 2016-10-15 MED FILL — ATORVASTATIN 40 MG TABLET: 40 | 30 days supply | Qty: 30 | Fill #0

## 2016-10-15 MED FILL — FUROSEMIDE 40 MG TABLET: 40 | 30 days supply | Qty: 30 | Fill #0

## 2016-10-15 MED FILL — ?LISINOPRIL 20 MG TABLET: 20 | 30 days supply | Qty: 60 | Fill #0

## 2016-10-15 MED FILL — BD INSULIN SYR 0.3 ML 6MMX3: 31G X 15/64 | 100 days supply | Qty: 100 | Fill #0

## 2016-10-15 NOTE — Telephone Encounter (Signed)
Refills sent in to pharmacy 

## 2016-11-25 MED FILL — ATORVASTATIN 40 MG TABLET: 40 | 30 days supply | Qty: 30 | Fill #1

## 2016-11-25 MED FILL — CARVEDILOL 25 MG TABLET: 25 | 30 days supply | Qty: 60 | Fill #1

## 2016-11-25 MED FILL — metFORMIN HCL 1000 MG TABS: 1000 | 30 days supply | Qty: 60 | Fill #1

## 2016-11-25 MED FILL — ?LISINOPRIL 20 MG TABLET: 20 | 30 days supply | Qty: 60 | Fill #1

## 2016-11-25 MED FILL — FUROSEMIDE 40 MG TABLET: 40 | 30 days supply | Qty: 30 | Fill #1

## 2016-11-25 MED FILL — ?SPIRONOLACTONE 25 MG TABLE: 25 MG | 30 days supply | Qty: 30 | Fill #1

## 2017-01-08 MED FILL — CARVEDILOL 25 MG TABLET: 25 | 30 days supply | Qty: 60 | Fill #2

## 2017-01-08 MED FILL — ?SPIRONOLACTONE 25 MG TABLE: 25 MG | 30 days supply | Qty: 30 | Fill #2

## 2017-01-08 MED FILL — ?LISINOPRIL 20 MG TABLET: 20 | 30 days supply | Qty: 60 | Fill #2

## 2017-01-08 MED FILL — FUROSEMIDE 40 MG TABLET: 40 | 30 days supply | Qty: 30 | Fill #2

## 2017-01-08 MED FILL — metFORMIN HCL 1000 MG TABS: 1000 | 30 days supply | Qty: 60 | Fill #2

## 2017-01-08 MED FILL — ATORVASTATIN 40 MG TABLET: 40 | 30 days supply | Qty: 30 | Fill #2

## 2017-01-13 ENCOUNTER — Encounter: Payer: Self-pay | Admitting: Family Medicine

## 2017-01-13 ENCOUNTER — Other Ambulatory Visit: Payer: Self-pay | Admitting: Family Medicine

## 2017-01-13 ENCOUNTER — Ambulatory Visit (INDEPENDENT_AMBULATORY_CARE_PROVIDER_SITE_OTHER): Payer: Self-pay | Admitting: Family Medicine

## 2017-01-13 VITALS — BP 148/80 | HR 81 | Temp 97.6°F | Resp 12 | Ht 71.5 in | Wt 237.0 lb

## 2017-01-13 DIAGNOSIS — E118 Type 2 diabetes mellitus with unspecified complications: Secondary | ICD-10-CM

## 2017-01-13 DIAGNOSIS — I509 Heart failure, unspecified: Secondary | ICD-10-CM

## 2017-01-13 DIAGNOSIS — I10 Essential (primary) hypertension: Secondary | ICD-10-CM

## 2017-01-13 LAB — CBC WITH DIFFERENTIAL/PLATELET
BASOS PCT: 0 %
Basophils Absolute: 0 cells/uL (ref 0–200)
Eosinophils Absolute: 93 cells/uL (ref 15–500)
Eosinophils Relative: 1 %
HEMATOCRIT: 39.9 % (ref 38.5–50.0)
HEMOGLOBIN: 13.5 g/dL (ref 13.2–17.1)
Lymphocytes Relative: 27 %
Lymphs Abs: 2511 cells/uL (ref 850–3900)
MCH: 29.3 pg (ref 27.0–33.0)
MCHC: 33.8 g/dL (ref 32.0–36.0)
MCV: 86.7 fL (ref 80.0–100.0)
MONO ABS: 465 {cells}/uL (ref 200–950)
MPV: 9.4 fL (ref 7.5–12.5)
Monocytes Relative: 5 %
Neutro Abs: 6231 cells/uL (ref 1500–7800)
Neutrophils Relative %: 67 %
Platelets: 305 10*3/uL (ref 140–400)
RBC: 4.6 MIL/uL (ref 4.20–5.80)
RDW: 13.9 % (ref 11.0–15.0)
WBC: 9.3 10*3/uL (ref 3.8–10.8)

## 2017-01-13 LAB — COMPLETE METABOLIC PANEL WITH GFR
ALT: 10 U/L (ref 9–46)
AST: 19 U/L (ref 10–40)
Albumin: 4.6 g/dL (ref 3.6–5.1)
Alkaline Phosphatase: 78 U/L (ref 40–115)
BUN: 15 mg/dL (ref 7–25)
CHLORIDE: 101 mmol/L (ref 98–110)
CO2: 26 mmol/L (ref 20–31)
CREATININE: 1.12 mg/dL (ref 0.60–1.35)
Calcium: 9.9 mg/dL (ref 8.6–10.3)
GFR, Est African American: 89 mL/min (ref >=60)
GFR, Est Non African American: 77 mL/min (ref >=60)
Glucose, Bld: 227 mg/dL — ABNORMAL HIGH (ref 65–99)
Potassium: 4.3 mmol/L (ref 3.5–5.3)
Sodium: 138 mmol/L (ref 135–146)
Total Bilirubin: 0.6 mg/dL (ref 0.2–1.2)
Total Protein: 7.3 g/dL (ref 6.1–8.1)

## 2017-01-13 LAB — TSH: TSH: 1.16 mIU/L (ref 0.40–4.50)

## 2017-01-13 LAB — LIPID PANEL
CHOL/HDL RATIO: 4.6 ratio (ref <5.0)
CHOLESTEROL: 132 mg/dL (ref <200)
HDL: 29 mg/dL — ABNORMAL LOW (ref >40)
LDL Cholesterol: 40 mg/dL (ref <100)
TRIGLYCERIDES: 315 mg/dL — AB (ref <150)
VLDL: 63 mg/dL — AB (ref <30)

## 2017-01-13 LAB — POCT GLYCOSYLATED HEMOGLOBIN (HGB A1C): HEMOGLOBIN A1C: 9

## 2017-01-13 MED ORDER — LISINOPRIL 20 MG PO TABS: 20.0000 mg | ORAL_TABLET | Freq: Two times a day (BID) | ORAL | 6 refills | Status: DC

## 2017-01-13 MED ORDER — CARVEDILOL 25 MG PO TABS: 25.0000 mg | ORAL_TABLET | Freq: Two times a day (BID) | ORAL | 6 refills | Status: DC

## 2017-01-13 MED ORDER — METFORMIN HCL 1000 MG PO TABS: 1000.0000 mg | ORAL_TABLET | Freq: Two times a day (BID) | ORAL | 1 refills | Status: DC

## 2017-01-13 MED ORDER — INSULIN PEN NEEDLE 31G X 6 MM MISC: 0 refills | Status: DC

## 2017-01-13 MED ORDER — INSULIN PEN NEEDLE 30G X 8 MM MISC: 1.0000 | Status: DC | PRN

## 2017-01-13 MED ORDER — SPIRONOLACTONE 25 MG PO TABS: 25.0000 mg | ORAL_TABLET | Freq: Every day | ORAL | 6 refills | Status: DC

## 2017-01-13 MED ORDER — INSULIN NPH ISOPHANE & REGULAR (70-30) 100 UNIT/ML ~~LOC~~ SUSP: SUBCUTANEOUS | 3 refills | Status: DC

## 2017-01-13 MED ORDER — FUROSEMIDE 40 MG PO TABS: 40.0000 mg | ORAL_TABLET | Freq: Every day | ORAL | 6 refills | Status: DC

## 2017-01-13 MED ORDER — ATORVASTATIN CALCIUM 40 MG PO TABS: 40.0000 mg | ORAL_TABLET | Freq: Every day | ORAL | 2 refills | Status: DC

## 2017-01-13 MED ORDER — INSULIN GLARGINE 100 UNIT/ML SOLOSTAR PEN: 10.0000 [IU] | PEN_INJECTOR | Freq: Every day | SUBCUTANEOUS | 99 refills | Status: DC

## 2017-01-13 MED FILL — BD INSULIN SYR 0.3 ML 6MMX3: 31G X 15/64 | 30 days supply | Qty: 100 | Fill #0

## 2017-01-13 MED FILL — !NOVOLIN 70/30 100 UNITS/ML: (70-30) 100 | 10 days supply | Qty: 10 | Fill #0

## 2017-01-13 MED FILL — ?CARVEDILOL 25 MG TABLET: 25 | 30 days supply | Qty: 60 | Fill #0

## 2017-01-13 MED FILL — FUROSEMIDE 40 MG TABLET: 40 | 30 days supply | Qty: 30 | Fill #0

## 2017-01-13 MED FILL — metFORMIN HCL 1000 MG TABS: 1000 | 30 days supply | Qty: 60 | Fill #0

## 2017-01-13 MED FILL — ATORVASTATIN 40 MG TABLET: 40 | 30 days supply | Qty: 30 | Fill #0

## 2017-01-13 MED FILL — ?SPIRONOLACTONE 25 MG TABLE: 25 MG | 30 days supply | Qty: 30 | Fill #0

## 2017-01-13 NOTE — Patient Instructions (Signed)
Take 10 units of new insulin at 10pm. Careful of sweets and other carbs.

## 2017-01-13 NOTE — Progress Notes (Signed)
Jared Hart, is a 51 y.o. male  [id number]  [id number]  DOB - [birth year]  CC:  Chief Complaint  Patient presents with  . Diabetes    glucose numbers at home are averaging 200, hemoglobin A1C 9.0  . Hypertension    concerned when he is still at night he can hear his pulse in his ear       HPI: Jared Hart is a 51 y.o. male here to follow-up chronic conditions. He has Diabetes, HTN, hyperlipidemia, cardiomegaly and combined systolic diastolic heart failure. He has a history of CVA. He is on metformin 100 bid and Novolin 10 units bid. His A1C today is 9.0 up from 8.5 last visit. He reports he is doing well with his congestiive heart failure. He is on Lasix, Coreg, spiralactone, and lisinopril for HTN and CHF. He is on lipitor. His medications and dosages are in his medication list and have been reviewed. He  Exercises regularly at [employer]. Tries to follow a good diabetic diet. He does not smoke, denies alcohol or drug use.   His immunizations are up to date. He does need a diabetic eye exam.    No Known Allergies Past Medical History:  Diagnosis Date  . CHF (congestive heart failure) ([place])   . Colon cancer ([place])   . Diabetes mellitus   . HTN (hypertension)   . Hypercholesteremia   . Stroke [facility])    Current Outpatient Prescriptions on File Prior to Visit  Medication Sig Dispense Refill  . aspirin 81 MG EC tablet Take 1 tablet (81 mg total) by mouth daily. 30 tablet 6  . Insulin Syringe-Needle U-100 (INSULIN SYRINGE .5CC/31GX5/16") 31G X 5/16" 0.5 ML MISC Use as per directions. 100 each 0   No current facility-administered medications on file prior to visit.    Family History  Problem Relation Age of Onset  . Diabetes    . Cancer    . Hypertension    . Hypertension Mother   . Diabetes Mother   . Cancer Mother   . Hypertension Father    Social History   Social History  . Marital status: Married    Spouse name: N/A  . Number of children: N/A  . Years of  education: N/A   Occupational History  . Not on file.   Social History Main Topics  . Smoking status: Never Smoker  . Smokeless tobacco: Current User    Types: Snuff  . Alcohol use No  . Drug use: No  . Sexual activity: Not on file   Other Topics Concern  . Not on file   Social History Narrative  . No narrative on file    Review of Systems: Constitutional: Negative Skin: Negative HENT: Negative  Eyes: Negative  Neck: Negative Respiratory: Negative Cardiovascular: Negative Gastrointestinal: Negative Genitourinary: Negative  Musculoskeletal: Negative   Neurological: Negative for Hematological: Negative  Psychiatric/Behavioral: Negative    Objective:   Vitals:   01/13/17 1309 01/13/17 1315  BP: (!) 156/82 (!) 148/80  Pulse: 81   Resp: 12   Temp: 97.6 F (36.4 C)     Physical Exam: Constitutional: Patient appears well-developed and well-nourished. No distress. HENT: Normocephalic, atraumatic, External right and left ear normal. Oropharynx is clear and moist.  Eyes: Conjunctivae and EOM are normal. PERRLA, no scleral icterus. Neck: Normal ROM. Neck supple. No lymphadenopathy, No thyromegaly. CVS: RRR, S1/S2 +, no murmurs, no gallops, no rubs Pulmonary: Effort and breath sounds normal, no stridor, rhonchi, wheezes, rales.  Abdominal:  Soft. Normoactive BS,, no distension, tenderness, rebound or guarding.  Musculoskeletal: Normal range of motion. No edema and no tenderness.  Neuro: Alert.Normal muscle tone coordination. Non-focal Skin: Skin is warm and dry. No rash noted. Not diaphoretic. No erythema. No pallor. Psychiatric: Normal mood and affect. Behavior, judgment, thought content normal.  Lab Results  Component Value Date   WBC 7.6 10/08/2016   HGB 12.1 (L) 10/08/2016   HCT 36.1 (L) 10/08/2016   MCV 86.6 10/08/2016   PLT 251 10/08/2016   Lab Results  Component Value Date   CREATININE 0.90 10/08/2016   BUN 14 10/08/2016   NA 137 10/08/2016   K 4.3  10/08/2016   CL 103 10/08/2016   CO2 26 10/08/2016    Lab Results  Component Value Date   HGBA1C 9.0 01/13/2017   Lipid Panel     Component Value Date/Time   CHOL 102 (L) 10/08/2016 1411   TRIG 149 10/08/2016 1411   HDL 27 (L) 10/08/2016 1411   CHOLHDL 3.8 10/08/2016 1411   VLDL 30 10/08/2016 1411   LDLCALC 45 10/08/2016 1411        Assessment and plan:   1. Type 2 diabetes mellitus with complication, unspecified long term insulin use status (HCC)  - COMPLETE METABOLIC PANEL WITH GFR - CBC with Differential - POCT glycosylated hemoglobin (Hb A1C) - Lipid panel - TSH - Ambulatory referral to Ophthalmology - Insulin Pen Needle (NOVOFINE) 10 each; Inject 10 each into the skin as needed (tid).  2. Congestive heart failure, unspecified congestive heart failure chronicity, unspecified congestive heart failure type (HCC)  - furosemide (LASIX) 40 MG tablet; Take 1 tablet (40 mg total) by mouth daily.  Dispense: 30 tablet; Refill: 6  3. HYPERTENSION, BENIGN ESSENTIAL  - lisinopril (PRINIVIL,ZESTRIL) 20 MG tablet; Take 1 tablet (20 mg total) by mouth 2 (two) times daily.  Dispense: 60 tablet; Refill: 6   Return in about 3 months (around 04/13/2017).  The patient was given clear instructions to go to ER or return to medical center if symptoms don't improve, worsen or new problems develop. The patient verbalized understanding.    Micheline Chapman FNP  01/13/2017, 1:52 PM

## 2017-01-14 MED FILL — !LANTUS 100 UNITS/ML VIAL: 100 | 93 days supply | Qty: 10 | Fill #0

## 2017-02-08 ENCOUNTER — Emergency Department
Admission: EM | Admit: 2017-02-08 | Discharge: 2017-02-08 | Disposition: A | Discharge status: Home / Self Care | Payer: Self-pay | Attending: Emergency Medicine | Admitting: Emergency Medicine

## 2017-02-08 DIAGNOSIS — I5042 Chronic combined systolic (congestive) and diastolic (congestive) heart failure: Secondary | ICD-10-CM | POA: Insufficient documentation

## 2017-02-08 DIAGNOSIS — R110 Nausea: Secondary | ICD-10-CM

## 2017-02-08 DIAGNOSIS — Z7982 Long term (current) use of aspirin: Secondary | ICD-10-CM | POA: Insufficient documentation

## 2017-02-08 DIAGNOSIS — Z79899 Other long term (current) drug therapy: Secondary | ICD-10-CM | POA: Insufficient documentation

## 2017-02-08 DIAGNOSIS — R112 Nausea with vomiting, unspecified: Secondary | ICD-10-CM | POA: Insufficient documentation

## 2017-02-08 DIAGNOSIS — R55 Syncope and collapse: Secondary | ICD-10-CM | POA: Insufficient documentation

## 2017-02-08 DIAGNOSIS — F17290 Nicotine dependence, other tobacco product, uncomplicated: Secondary | ICD-10-CM | POA: Insufficient documentation

## 2017-02-08 DIAGNOSIS — I110 Hypertensive heart disease with heart failure: Secondary | ICD-10-CM | POA: Insufficient documentation

## 2017-02-08 DIAGNOSIS — Z85038 Personal history of other malignant neoplasm of large intestine: Secondary | ICD-10-CM | POA: Insufficient documentation

## 2017-02-08 DIAGNOSIS — E119 Type 2 diabetes mellitus without complications: Secondary | ICD-10-CM | POA: Insufficient documentation

## 2017-02-08 DIAGNOSIS — Z794 Long term (current) use of insulin: Secondary | ICD-10-CM | POA: Insufficient documentation

## 2017-02-08 LAB — BASIC METABOLIC PANEL
ANION GAP: 8 (ref 5–15)
BUN: 15 mg/dL (ref 6–20)
CALCIUM: 8.2 mg/dL — AB (ref 8.9–10.3)
CO2: 26 mmol/L (ref 22–32)
CREATININE: 1.09 mg/dL (ref 0.61–1.24)
Chloride: 100 mmol/L — ABNORMAL LOW (ref 101–111)
Glucose, Bld: 248 mg/dL — ABNORMAL HIGH (ref 65–99)
Potassium: 3.4 mmol/L — ABNORMAL LOW (ref 3.5–5.1)
SODIUM: 134 mmol/L — AB (ref 135–145)

## 2017-02-08 LAB — CBC
HCT: 34.1 % — ABNORMAL LOW (ref 40.0–52.0)
HEMOGLOBIN: 11.9 g/dL — AB (ref 13.0–18.0)
MCH: 29.5 pg (ref 26.0–34.0)
MCHC: 34.8 g/dL (ref 32.0–36.0)
MCV: 84.6 fL (ref 80.0–100.0)
PLATELETS: 185 10*3/uL (ref 150–440)
RBC: 4.03 MIL/uL — AB (ref 4.40–5.90)
RDW: 13.5 % (ref 11.5–14.5)
WBC: 9.6 10*3/uL (ref 3.8–10.6)

## 2017-02-08 LAB — GLUCOSE, CAPILLARY: GLUCOSE-CAPILLARY: 226 mg/dL — AB (ref 65–99)

## 2017-02-08 LAB — TROPONIN I: Troponin I: <0.03 ng/mL (ref <0.03)

## 2017-02-08 NOTE — Discharge Instructions (Signed)

## 2017-02-08 NOTE — ED Provider Notes (Signed)
[facility] Emergency Department Provider Note  ____________________________________________   First MD Initiated Contact with Patient 02/08/17 1459     (approximate)  I have reviewed the triage vital signs and the nursing notes.   HISTORY  Chief Complaint Loss of Consciousness    HPI TAESHAWN Hart is a 51 y.o. male with a history as listed below who presents for evaluation of a syncopal episode.  He reports that he has gradually been worsening since yesterday including mild nausea, one episode of vomiting, slightly decreased appetite, and decreased energy level.  His vomiting episode was during the night, and he says that he has not had anything to eat or drink today but he did take his regular medications including his diuretics.  He reports that he started to feel lightheaded, dizzy, and very warm, and he knew he was going to pass out so he laid himself down on the floor and then briefly lost consciousness.  He was not postictal afterwards and there was no observed seizure-like activity and he reports no injuries.  He is back to his baseline mental status.    He received a 519mL NS bolus by EMS and says he feels much better after the fluids.  He denies fever/chills, URI symptoms, chest pain, shortness of breath, cough, abdominal pain, dysuria.  The onset of the symptoms was acute, severe, but is essentially resolved at this time.   Past Medical History:  Diagnosis Date  . CHF (congestive heart failure) ([place])   . Colon cancer ([place])   . Diabetes mellitus   . HTN (hypertension)   . Hypercholesteremia   . Stroke [facility])     Patient Active Problem List   Diagnosis Date Noted  . Hyperlipidemia 07/09/2016  . Acute CVA (cerebrovascular accident) ([place]) 07/09/2016  . Cerebrovascular accident (CVA) due to occlusion of precerebral artery ([place])   . Stroke-like symptom 07/08/2016  . TIA (transient ischemic attack) 07/08/2016  . Chronic combined systolic  (congestive) and diastolic (congestive) heart failure 07/08/2016  . Fever 04/02/2015  . Bronchiolitis 04/02/2015  . Type 2 diabetes mellitus with hyperglycemia ([place]) 04/02/2015  . Hypokalemia 04/02/2015  . Febrile illness, acute   . Fatigue 09/28/2012  . Acute exacerbation of CHF (congestive heart failure) ([place]) 09/19/2012  . VARICOSE VEINS, LOWER EXTREMITIES 08/24/2010  . Obesity 06/29/2010  . SYSTOLIC HEART FAILURE, CHRONIC 06/01/2010  . Malignant neoplasm of colon ([place]) 05/30/2010  . HYPERLIPIDEMIA 05/30/2010  . HYPERTENSION, BENIGN ESSENTIAL 05/30/2010  . CARDIOMYOPATHY 05/13/2010  . CHF 05/13/2010  . Type 2 diabetes mellitus ([place]) 12/30/1998    Past Surgical History:  Procedure Laterality Date  . APPENDECTOMY    . CHOLECYSTECTOMY    . COLON SURGERY      Prior to Admission medications   Medication Sig Start Date End Date Taking? Authorizing Provider  aspirin 81 MG EC tablet Take 1 tablet (81 mg total) by mouth daily. 10/15/16  Yes Micheline Chapman, NP  atorvastatin (LIPITOR) 40 MG tablet Take 1 tablet (40 mg total) by mouth daily at 6 PM. 01/13/17  Yes Micheline Chapman, NP  carvedilol (COREG) 25 MG tablet Take 1 tablet (25 mg total) by mouth 2 (two) times daily with a meal. 01/13/17  Yes Micheline Chapman, NP  docusate sodium (COLACE) 100 MG capsule Take 100 mg by mouth daily.   Yes Historical Provider, MD  furosemide (LASIX) 40 MG tablet Take 1 tablet (40 mg total) by mouth daily. 01/13/17  Yes Micheline Chapman,  NP  Insulin Glargine (LANTUS SOLOSTAR) 100 UNIT/ML Solostar Pen Inject 10 Units into the skin daily at 10 pm. 01/13/17  Yes Micheline Chapman, NP  insulin NPH-regular Human (NOVOLIN 70/30) (70-30) 100 UNIT/ML injection INJECT 10 UNITS INTO THE SKIN 2 TIMES DAILY WITH A MEAL. MAY USE [employer] RELION BRAND. 01/13/17  Yes Micheline Chapman, NP  lisinopril (PRINIVIL,ZESTRIL) 20 MG tablet Take 1 tablet (20 mg total) by mouth 2 (two) times daily. 01/13/17  Yes Micheline Chapman,  NP  metFORMIN (GLUCOPHAGE) 1000 MG tablet Take 1 tablet (1,000 mg total) by mouth 2 (two) times daily with a meal. 01/13/17  Yes Micheline Chapman, NP  spironolactone (ALDACTONE) 25 MG tablet Take 1 tablet (25 mg total) by mouth daily. 01/13/17  Yes Micheline Chapman, NP  Insulin Pen Needle (CAREFINE PEN NEEDLES) 31G X 6 MM MISC Check glucose fasting in am and before meals. 01/13/17   Micheline Chapman, NP  Insulin Syringe-Needle U-100 (INSULIN SYRINGE .5CC/31GX5/16") 31G X 5/16" 0.5 ML MISC Use as per directions. 10/15/16   Micheline Chapman, NP    Allergies Patient has no known allergies.  Family History  Problem Relation Age of Onset  . Diabetes    . Cancer    . Hypertension    . Hypertension Mother   . Diabetes Mother   . Cancer Mother   . Hypertension Father     Social History Social History  Substance Use Topics  . Smoking status: Never Smoker  . Smokeless tobacco: Current User    Types: Snuff  . Alcohol use No    Review of Systems Constitutional: No fever/chills.  Mild general malaise since yesterday Eyes: No visual changes. ENT: No sore throat. Cardiovascular: Denies chest pain.  Brief syncopal episode.   Respiratory: Denies shortness of breath. Gastrointestinal: No abdominal pain.  Intermittent nausea and one episode of emesis since yesterday.  No diarrhea Genitourinary: Negative for dysuria. Musculoskeletal: Negative for back pain. Skin: Negative for rash. Neurological: Negative for headaches, focal weakness or numbness.  No seizure-like activity, no postictal state.  10-point ROS otherwise negative.  ____________________________________________   PHYSICAL EXAM:  VITAL SIGNS: ED Triage Vitals  Enc Vitals Group     BP 02/08/17 1421 126/67     Pulse Rate 02/08/17 1421 79     Resp 02/08/17 1421 16     Temp 02/08/17 1418 99.1 F (37.3 C)     Temp Source 02/08/17 1418 Oral     SpO2 02/08/17 1421 99 %     Weight 02/08/17 1418 235 lb (106.6 kg)     Height  02/08/17 1418 6\' 1"  (1.854 m)     Head Circumference --      Peak Flow --      Pain Score 02/08/17 1418 0     Pain Loc --      Pain Edu? --      Excl. in [place]? --     Constitutional: Alert and oriented. Well appearing and in no acute distress. Eyes: Conjunctivae are normal. PERRL. EOMI. Head: Atraumatic. Nose: No congestion/rhinnorhea. Mouth/Throat: Mucous membranes are moist. Neck: No stridor.  No meningeal signs.   Cardiovascular: Normal rate, regular rhythm. Good peripheral circulation. Grossly normal heart sounds. Respiratory: Normal respiratory effort.  No retractions. Lungs CTAB. Gastrointestinal: Soft and nontender. No distention.  Musculoskeletal: No lower extremity tenderness nor edema. No gross deformities of extremities. Neurologic:  Normal speech and language. No gross focal neurologic deficits are appreciated.  Skin:  Skin is pale, warm, dry and intact. No rash noted. Psychiatric: Mood and affect are normal. Speech and behavior are normal.  ____________________________________________   LABS (all labs ordered are listed, but only abnormal results are displayed)  Labs Reviewed  BASIC METABOLIC PANEL - Abnormal; Notable for the following:       Result Value   Sodium 134 (*)    Potassium 3.4 (*)    Chloride 100 (*)    Glucose, Bld 248 (*)    Calcium 8.2 (*)    All other components within normal limits  CBC - Abnormal; Notable for the following:    RBC 4.03 (*)    Hemoglobin 11.9 (*)    HCT 34.1 (*)    All other components within normal limits  GLUCOSE, CAPILLARY - Abnormal; Notable for the following:    Glucose-Capillary 226 (*)    All other components within normal limits  URINALYSIS, COMPLETE (UACMP) WITH MICROSCOPIC  TROPONIN I  CBG MONITORING, ED   ____________________________________________  EKG  ED ECG REPORT I, Fairy Ashlock, the attending physician, personally viewed and interpreted this ECG.  Date: 02/08/2017 EKG Time: 14:19 Rate:  79 Rhythm: normal sinus rhythm QRS Axis: LAD Intervals: normal ST/T Wave abnormalities: Non-specific ST segment / T-wave changes, but no evidence of acute ischemia.  T-wave inversions in leads aVL, V6 Conduction Disturbances: none Narrative Interpretation: unremarkable  ____________________________________________  RADIOLOGY   No results found.  ____________________________________________   PROCEDURES  Procedure(s) performed:   Procedures   Critical Care performed: No ____________________________________________   INITIAL IMPRESSION / ASSESSMENT AND PLAN / ED COURSE  Pertinent labs & imaging results that were available during my care of the patient were reviewed by me and considered in my medical decision making (see chart for details).  The patient is well-appearing and in no acute distress with normal vital signs.  He states he feels much better after some IV fluid.  I suspect he was slightly over diuresed in the setting of a mild GI illness and no by mouth intake today.  He does not qualify for the symphysis of syncope rule due to his history of CHF but otherwise his workup and evaluation is reassuring.  I will give him a meal and continue to monitor him and if he remains asymptomatic think it is appropriate for him to be discharged home.  There is no indication that he suffered from any abnormal cardiac arrhythmia or other acute or emergent medical condition that would require hospitalization.  The patient and his wife understand and agree with this plan.   Clinical Course as of Feb 09 1756  Sat Feb 08, 2017  1708 The patient states that he feels much better and ate a meal.  He states he is ready to go home and I think that is appropriate.  His vital signs are stable and I gave my usual and customary return precautions.  [CF]    Clinical Course User Index [CF] Hinda Kehr, MD    ____________________________________________  FINAL CLINICAL IMPRESSION(S) / ED  DIAGNOSES  Final diagnoses:  Syncope and collapse  Nausea     MEDICATIONS GIVEN DURING THIS VISIT:  Medications - No data to display   NEW OUTPATIENT MEDICATIONS STARTED DURING THIS VISIT:  Discharge Medication List as of 02/08/2017  5:12 PM      Discharge Medication List as of 02/08/2017  5:12 PM      Discharge Medication List as of 02/08/2017  5:12 PM  Note:  This document was prepared using Dragon voice recognition software and may include unintentional dictation errors.    Hinda Kehr, MD 02/08/17 [phone]

## 2017-02-08 NOTE — ED Notes (Signed)
This RN to bedside at this time. Pt given a meal tray per MD order, given diet coke per request. Pt is visualized in NAD. This RN introduced self to patient, pt denies any further needs at this time. Will continue to monitor for further patient needs.

## 2017-02-08 NOTE — ED Triage Notes (Signed)
Pt arrived via EMS from home after syncopal episode at home. Pt was found by son who had to move him around to get him aroused.  Pt states he started feeling sick yesterday with nausea and vomited x 1. He also reports cough which has been non-productive.

## 2017-02-08 NOTE — ED Notes (Signed)
This RN to bedside at this time. Pt requesting urinal to provide urine specimen, denies further needs at this time. Will return to bedside to collect specimen. Will continue to monitor for further patient needs.

## 2017-02-11 ENCOUNTER — Telehealth: Payer: Self-pay

## 2017-02-11 NOTE — Telephone Encounter (Signed)
Spoke with patient, advised to try some saline nasal spray to help with sinus congestion.

## 2017-03-04 MED FILL — SPIRONOLACTONE 25 MG TABLET: 25 | 30 days supply | Qty: 30 | Fill #1

## 2017-03-04 MED FILL — ?LISINOPRIL 20 MG TABLET: 20 | 30 days supply | Qty: 60 | Fill #3

## 2017-03-04 MED FILL — metFORMIN HCL 1000 MG TABS: 1000 | 30 days supply | Qty: 60 | Fill #1

## 2017-03-04 MED FILL — ?CARVEDILOL 25 MG TABLET: 25 | 30 days supply | Qty: 60 | Fill #1

## 2017-03-04 MED FILL — ATORVASTATIN 40 MG TABLET: 40 | 30 days supply | Qty: 30 | Fill #1

## 2017-03-04 MED FILL — !NOVOLIN 70/30 100 UNITS/ML: (70-30) 100 | 30 days supply | Qty: 10 | Fill #1

## 2017-03-04 MED FILL — FUROSEMIDE 40 MG TABLET: 40 | 30 days supply | Qty: 30 | Fill #1

## 2017-04-07 ENCOUNTER — Encounter: Payer: Self-pay | Admitting: Family Medicine

## 2017-04-07 ENCOUNTER — Ambulatory Visit (INDEPENDENT_AMBULATORY_CARE_PROVIDER_SITE_OTHER): Payer: Self-pay | Admitting: Family Medicine

## 2017-04-07 VITALS — BP 148/80 | HR 78 | Temp 97.5°F | Resp 18 | Ht 73.0 in | Wt 242.0 lb

## 2017-04-07 DIAGNOSIS — Z23 Encounter for immunization: Secondary | ICD-10-CM

## 2017-04-07 DIAGNOSIS — I6320 Cerebral infarction due to unspecified occlusion or stenosis of unspecified precerebral arteries: Secondary | ICD-10-CM

## 2017-04-07 DIAGNOSIS — I5022 Chronic systolic (congestive) heart failure: Secondary | ICD-10-CM

## 2017-04-07 DIAGNOSIS — E119 Type 2 diabetes mellitus without complications: Secondary | ICD-10-CM

## 2017-04-07 DIAGNOSIS — Z794 Long term (current) use of insulin: Secondary | ICD-10-CM

## 2017-04-07 LAB — POCT URINALYSIS DIP (DEVICE)
BILIRUBIN URINE: NEGATIVE
GLUCOSE, UA: 100 mg/dL — AB
KETONES UR: NEGATIVE mg/dL
LEUKOCYTES UA: NEGATIVE
Nitrite: NEGATIVE
PROTEIN: 100 mg/dL — AB
Specific Gravity, Urine: 1.02 (ref 1.005–1.030)
Urobilinogen, UA: 0.2 mg/dL (ref 0.0–1.0)
pH: 5 (ref 5.0–8.0)

## 2017-04-07 LAB — POCT GLYCOSYLATED HEMOGLOBIN (HGB A1C): HEMOGLOBIN A1C: 8.6

## 2017-04-07 NOTE — Progress Notes (Signed)
Patient ID: Jared Hart, male    DOB: [birth year], 51 y.o.   MRN: [id number]  PCP: Molli Barrows, FNP  Chief Complaint  Patient presents with  . Follow-up    3 MONTH ON DIABETES    Subjective:  HPI EZRAH Hart is a 51 y.o. male who presents for a 3 month diabetes follow-up. Medical problems include: TIA, Cardiomyopathy, Systolic Heart Failure, Hypertension, and T2DM-insulin dependent.  Aydrian reports routine monitoring of blood sugars. He reports readings consistently between 135 and on really bad days readings as high as 300. His last A1C 9.0. He reports routine physical activity and attends the gym twice per week with 1 hour workouts. Denies hypoglycemia episodes. Reports that he snacks in between meals to prevent lows. Isreal has not had any recent cardiology or neurology follow-ups. He suffered from CVA July 2017 and he unaware of any follow-up with neuro outside of the hospital setting. Denies any known deficits. He request PNA vaccine today and would like a referral for a diabetic eye exam.  Social History   Social History  . Marital status: Married    Spouse name: N/A  . Number of children: N/A  . Years of education: N/A   Occupational History  . Not on file.   Social History Main Topics  . Smoking status: Never Smoker  . Smokeless tobacco: Current User    Types: Snuff  . Alcohol use No  . Drug use: No  . Sexual activity: Not on file   Other Topics Concern  . Not on file   Social History Narrative  . No narrative on file    Family History  Problem Relation Age of Onset  . Diabetes    . Cancer    . Hypertension    . Hypertension Mother   . Diabetes Mother   . Cancer Mother   . Hypertension Father      Review of Systems  Patient Active Problem List   Diagnosis Date Noted  . Hyperlipidemia 07/09/2016  . Acute CVA (cerebrovascular accident) ([place]) 07/09/2016  . Cerebrovascular accident (CVA) due to occlusion of precerebral artery ([place])   .  Stroke-like symptom 07/08/2016  . TIA (transient ischemic attack) 07/08/2016  . Chronic combined systolic (congestive) and diastolic (congestive) heart failure 07/08/2016  . Fever 04/02/2015  . Bronchiolitis 04/02/2015  . Type 2 diabetes mellitus with hyperglycemia ([place]) 04/02/2015  . Hypokalemia 04/02/2015  . Febrile illness, acute   . Fatigue 09/28/2012  . Acute exacerbation of CHF (congestive heart failure) ([place]) 09/19/2012  . VARICOSE VEINS, LOWER EXTREMITIES 08/24/2010  . Obesity 06/29/2010  . SYSTOLIC HEART FAILURE, CHRONIC 06/01/2010  . Malignant neoplasm of colon ([place]) 05/30/2010  . HYPERLIPIDEMIA 05/30/2010  . HYPERTENSION, BENIGN ESSENTIAL 05/30/2010  . CARDIOMYOPATHY 05/13/2010  . CHF 05/13/2010  . Type 2 diabetes mellitus ([place]) 12/30/1998    No Known Allergies  Prior to Admission medications   Medication Sig Start Date End Date Taking? Authorizing Provider  aspirin 81 MG EC tablet Take 1 tablet (81 mg total) by mouth daily. 10/15/16  Yes Micheline Chapman, NP  atorvastatin (LIPITOR) 40 MG tablet Take 1 tablet (40 mg total) by mouth daily at 6 PM. 01/13/17  Yes Micheline Chapman, NP  carvedilol (COREG) 25 MG tablet Take 1 tablet (25 mg total) by mouth 2 (two) times daily with a meal. 01/13/17  Yes Micheline Chapman, NP  docusate sodium (COLACE) 100 MG capsule Take 100 mg by mouth daily.   Yes  Historical Provider, MD  furosemide (LASIX) 40 MG tablet Take 1 tablet (40 mg total) by mouth daily. 01/13/17  Yes Micheline Chapman, NP  Insulin Glargine (LANTUS SOLOSTAR) 100 UNIT/ML Solostar Pen Inject 10 Units into the skin daily at 10 pm. 01/13/17  Yes Micheline Chapman, NP  insulin NPH-regular Human (NOVOLIN 70/30) (70-30) 100 UNIT/ML injection INJECT 10 UNITS INTO THE SKIN 2 TIMES DAILY WITH A MEAL. MAY USE [employer] RELION BRAND. 01/13/17  Yes Micheline Chapman, NP  Insulin Pen Needle (CAREFINE PEN NEEDLES) 31G X 6 MM MISC Check glucose fasting in am and before meals. 01/13/17  Yes  Micheline Chapman, NP  Insulin Syringe-Needle U-100 (INSULIN SYRINGE .5CC/31GX5/16") 31G X 5/16" 0.5 ML MISC Use as per directions. 10/15/16  Yes Micheline Chapman, NP  lisinopril (PRINIVIL,ZESTRIL) 20 MG tablet Take 1 tablet (20 mg total) by mouth 2 (two) times daily. 01/13/17  Yes Micheline Chapman, NP  metFORMIN (GLUCOPHAGE) 1000 MG tablet Take 1 tablet (1,000 mg total) by mouth 2 (two) times daily with a meal. 01/13/17  Yes Micheline Chapman, NP  spironolactone (ALDACTONE) 25 MG tablet Take 1 tablet (25 mg total) by mouth daily. 01/13/17  Yes Micheline Chapman, NP    Past Medical, Surgical Family and Social History reviewed and updated.    Objective:   Today's Vitals   04/07/17 1319 04/07/17 1413  BP: (!) 155/80 (!) 148/80  Pulse: 78   Resp: 18   Temp: 97.5 F (36.4 C)   TempSrc: Oral   SpO2: 95%   Weight: 242 lb (109.8 kg)   Height: 6\' 1"  (1.854 m)     Wt Readings from Last 3 Encounters:  04/07/17 242 lb (109.8 kg)  02/08/17 235 lb (106.6 kg)  01/13/17 237 lb (107.5 kg)   Lab Results  Component Value Date   HGBA1C 8.6 04/07/2017   Physical Exam  Constitutional: He is oriented to person, place, and time. He appears well-developed and well-nourished.  HENT:  Head: Normocephalic and atraumatic.  Eyes: Conjunctivae and EOM are normal. Pupils are equal, round, and reactive to light.  Neck: Normal range of motion. Neck supple. No thyromegaly present.  Cardiovascular: Normal rate, regular rhythm, normal heart sounds and intact distal pulses.   No murmur heard. Pulmonary/Chest: Effort normal and breath sounds normal.  Musculoskeletal: Normal range of motion.  Neurological: He is alert and oriented to person, place, and time. He exhibits normal muscle tone.  Skin: Skin is warm and dry.  Psychiatric: He has a normal mood and affect. His behavior is normal. Judgment and thought content normal.      Assessment & Plan:  1. Heart failure, unspecified heart failure chronicity,  unspecified heart failure type - Ambulatory referral to Cardiology  2. Type 2 diabetes mellitus without complication, with long-term current use of insulin (HCC) - POCT glycosylated hemoglobin (Hb A1C)-improved 8.6, 3 month goal  8.1 -Continue current insulin regimen  -Continue physical activity and small frequent meals  - Ambulatory referral to Ophthalmology  3. Cerebrovascular accident (CVA) due to occlusion of precerebral artery ([place]) -Ambulatory referral to Neurology    RTC: 3 months Will check lipid panel, CBC, CMP, and urine microalbuminuria next visit     Carroll Sage. Kenton Kingfisher, MSN, [facility] [address] [place], [place] [address] [phone]

## 2017-04-08 ENCOUNTER — Emergency Department (HOSPITAL_COMMUNITY)
Admission: EM | Admit: 2017-04-08 | Discharge: 2017-04-08 | Disposition: A | Discharge status: Home / Self Care | Payer: Self-pay | Attending: Dermatology | Admitting: Dermatology

## 2017-04-08 ENCOUNTER — Encounter (HOSPITAL_COMMUNITY): Payer: Self-pay

## 2017-04-08 DIAGNOSIS — Z5321 Procedure and treatment not carried out due to patient leaving prior to being seen by health care provider: Secondary | ICD-10-CM | POA: Insufficient documentation

## 2017-04-08 DIAGNOSIS — M79601 Pain in right arm: Secondary | ICD-10-CM | POA: Insufficient documentation

## 2017-04-08 NOTE — ED Notes (Signed)
Pt ambulatory out of emergency dept with family. Pt states he does not want to wait any longer and wound follow up with PCP tomorrow. PT verbalizes understanding of risk of leaving prior to being seen by MD.

## 2017-04-08 NOTE — ED Triage Notes (Signed)
Pt states he had stroke last year effecting left side; pt states he went to see cardiologist on yesterday and was given a shot in right arm of unknown med; pt was told he needed med because he has hx of stroke; pt presents a&ox 4 on arrival; spouse present; pt presents with swollen and redness to right upper arm with warm to touch; pt c/o of pain at 3/10 at rest but 10/10 when touched;

## 2017-04-09 ENCOUNTER — Ambulatory Visit (INDEPENDENT_AMBULATORY_CARE_PROVIDER_SITE_OTHER): Payer: Self-pay | Admitting: Family Medicine

## 2017-04-09 ENCOUNTER — Encounter: Payer: Self-pay | Admitting: Family Medicine

## 2017-04-09 VITALS — BP 144/82 | HR 85 | Temp 97.6°F | Resp 16 | Ht 73.0 in | Wt 242.0 lb

## 2017-04-09 DIAGNOSIS — T7840XA Allergy, unspecified, initial encounter: Secondary | ICD-10-CM

## 2017-04-09 MED ORDER — METHYLPREDNISOLONE ACETATE 80 MG/ML IJ SUSP: 80.0000 mg | Freq: Once | INTRAMUSCULAR | Status: DC

## 2017-04-09 MED ORDER — DEXAMETHASONE SODIUM PHOSPHATE 100 MG/10ML IJ SOLN
5.0000 mg | Freq: Once | INTRAMUSCULAR | Status: AC
  Administered 2017-04-09: 5 mg via INTRAMUSCULAR

## 2017-04-09 NOTE — Patient Instructions (Addendum)
At bedtime take a dose of Benadryl 12.5 mg.  Tomorrow, take 40 (pills) mg prednisone with breakfast and continue taking 40 mg (2 pills) for a total of 5 days.  Take Doxycyline 100 mg twice daily for 10 days.    Anaphylactic Reaction An anaphylactic reaction (anaphylaxis) is a sudden allergic reaction that is very bad (severe). It also affects more than one part of the body. This condition can be life-threatening. If you have an anaphylactic reaction, you need to get medical help right away. You may need to stay in the hospital. Your doctor may teach you how to use an allergy kit (anaphylaxis kit) and how to give yourself an allergy shot (epinephrine injection). You can give yourself an allergy shot with what is commonly called an auto-injector "pen." Symptoms of an anaphylactic reaction may include:  A stuffy nose (nasal congestion).  Headache.  Tingling in your mouth.  A flushed face.  An itchy, red rash.  Swelling of your eyes, lips, face, or tongue.  Swelling of the back of your mouth and your throat.  Breathing loudly (wheezing).  A hoarse voice.  Itchy, red, swollen areas of skin (hives).  Dizziness or light-headedness.  Passing out (fainting).  Feeling worried or nervous (anxiety).  Feeling confused.  Pain in your belly (abdomen) or chest.  Trouble with breathing, talking, or swallowing.  A tight feeling in your chest or throat.  Fast or uneven heartbeats (palpitations).  Throwing up (vomiting).  Watery poop (diarrhea). Follow these instructions at home: Safety   Always keep an auto-injector pen or your allergy kit with you. These could save your life. Use them as told by your doctor.  Do not drive until your doctor says that it is safe.  Make sure that you, the people who live with you, and your employer know:  How to use your allergy kit.  How to use an auto-injector pen to give you an allergy shot.  If you used your auto-injector pen:  Get  more medicine for it right away. This is important in case you have another reaction.  Get help right away.  Wear a bracelet or necklace that says you have an allergy, if your doctor tells you to do this.  Learn the signs of a very bad allergic reaction.  Work with your doctors to make a plan for what to do if you have a very bad allergic reaction. Being prepared is important. General instructions   Take over-the-counter and prescription medicines only as told by your doctor.  If you have itchy, red, swollen areas of skin or a rash:  Use over-the-counter medicine (antihistamine) as told by your doctor.  Put cold, wet cloths (cold compresses) on your skin.  Take baths or showers in cool water. Avoid hot water.  If you had tests done, it is up to you to get your test results. Ask your doctor when your results will be ready.  Tell any doctors who care for you that you have an allergy.  Keep all follow-up visits as told by your doctor. This is important. How is this prevented?  Avoid things (allergens) that gave you a very bad allergic reaction before.  If you have a food allergy and you go to a restaurant, tell your server about your allergy. If you are not sure if your meal was made with food that you are allergic to, ask your server before you eat it. Contact a doctor if:  You have symptoms of an allergic reaction.  You may notice them soon after being around whatever it is that you are allergic to. Symptoms may include:  A rash.  A headache.  Sneezing or a runny nose.  Swelling.  Feeling sick to your stomach.  Watery poop. Get help right away if:  You had to use your auto-injector pen. You must go to the emergency room even if the medicine seems to be working.  You have any of these:  A tight feeling in your chest or your throat.  Loud breathing.  Trouble with breathing.  Itchy, red, swollen areas of skin.  Red skin or itching all over your body.  Swelling  in your lips, tongue, or the back of your throat.  You have throwing up that gets very bad.  You have watery poop that gets very bad.  You pass out or feel like you might pass out. These symptoms may be an emergency. Do not wait to see if the symptoms will go away. Use your auto-injector pen or allergy kit as you have been told. Get medical help right away. Call your local emergency services (911 in the [place] Do not drive yourself to the hospital. Summary  An anaphylactic reaction (anaphylaxis) is a sudden allergic reaction that is very bad (severe).  This condition can be life-threatening. If you have an anaphylactic reaction, you need to get medical help right away.  Your doctor may teach you how to use an allergy kit (anaphylaxis kit) and how to give yourself an allergy shot (epinephrine injection) with an auto-injector "pen."  Always keep an auto-injector pen or your allergy kit with you. These could save your life. Use them as told by your doctor.  If you had to use your auto-injector pen, you must go to the emergency room even if the medicine seems to be working. This information is not intended to replace advice given to you by your health care provider. Make sure you discuss any questions you have with your health care provider. Document Released: 06/03/2008 Document Revised: 08/09/2016 Document Reviewed: 08/09/2016 Elsevier Interactive Patient Education  2017 Else[employer] Inc[employer]  Cellulitis, Adult Cellulitis is a skin infection. The infected area is usually red and sore. This condition occurs most often in the arms and lower legs. It is very important to get treated for this condition. Follow these instructions at home:  Take over-the-counter and prescription medicines only as told by your doctor.  If you were prescribed an antibiotic medicine, take it as told by your doctor. Do not stop taking the antibiotic even if you start to feel better.  Drink enough fluid to keep your pee  (urine) clear or pale yellow.  Do not touch or rub the infected area.  Raise (elevate) the infected area above the level of your heart while you are sitting or lying down.  Place warm or cold wet cloths (warm or cold compresses) on the infected area. Do this as told by your doctor.  Keep all follow-up visits as told by your doctor. This is important. These visits let your doctor make sure your infection is not getting worse. Contact a doctor if:  You have a fever.  Your symptoms do not get better after 1-2 days of treatment.  Your bone or joint under the infected area starts to hurt after the skin has healed.  Your infection comes back. This can happen in the same area or another area.  You have a swollen bump in the infected area.  You have new symptoms.  You feel ill and also have muscle aches and pains. Get help right away if:  Your symptoms get worse.  You feel very sleepy.  You throw up (vomit) or have watery poop (diarrhea) for a long time.  There are red streaks coming from the infected area.  Your red area gets larger.  Your red area turns darker. This information is not intended to replace advice given to you by your health care provider. Make sure you discuss any questions you have with your health care provider. Document Released: 06/03/2008 Document Revised: 05/23/2016 Document Reviewed: 10/25/2015 Elsevier Interactive Patient Education  2017 [employer].

## 2017-04-09 NOTE — Progress Notes (Signed)
Patient ID: Jared Hart, male    DOB: 1966/03/06, 50 y.o.   MRN: [id number]  PCP: Molli Barrows, FNP  Chief Complaint  Patient presents with  . Allergic Reaction    to pneumonia shot / right arm swollen and red    Subjective:  HPI Jared Hart is a 51 y.o. male presents for evaluation of what he feels is an allergic reaction after receipt of pneumococcal vaccine on 04/07/2017. Patient reports soreness and a localized pain after receiving the pneumovax vaccination. Upon awakening the next morning he noticed increased swelling, redness, and pain. He denies shortness or breath or airway obstruction. Denies itching and characterizes pain as throbbing. He's taken ibuprofen only.  Social History   Social History  . Marital status: Married    Spouse name: N/A  . Number of children: N/A  . Years of education: N/A   Occupational History  . Not on file.   Social History Main Topics  . Smoking status: Never Smoker  . Smokeless tobacco: Current User    Types: Snuff  . Alcohol use No  . Drug use: No  . Sexual activity: Not on file   Other Topics Concern  . Not on file   Social History Narrative  . No narrative on file    Family History  Problem Relation Age of Onset  . Diabetes    . Cancer    . Hypertension    . Hypertension Mother   . Diabetes Mother   . Cancer Mother   . Hypertension Father    Review of Systems See HPI  Patient Active Problem List   Diagnosis Date Noted  . Hyperlipidemia 07/09/2016  . Acute CVA (cerebrovascular accident) ([place]) 07/09/2016  . Cerebrovascular accident (CVA) due to occlusion of precerebral artery ([place])   . Stroke-like symptom 07/08/2016  . TIA (transient ischemic attack) 07/08/2016  . Chronic combined systolic (congestive) and diastolic (congestive) heart failure 07/08/2016  . Fever 04/02/2015  . Bronchiolitis 04/02/2015  . Type 2 diabetes mellitus with hyperglycemia ([place]) 04/02/2015  . Hypokalemia 04/02/2015  . Febrile  illness, acute   . Fatigue 09/28/2012  . Acute exacerbation of CHF (congestive heart failure) ([place]) 09/19/2012  . VARICOSE VEINS, LOWER EXTREMITIES 08/24/2010  . Obesity 06/29/2010  . SYSTOLIC HEART FAILURE, CHRONIC 06/01/2010  . Malignant neoplasm of colon ([place]) 05/30/2010  . HYPERLIPIDEMIA 05/30/2010  . HYPERTENSION, BENIGN ESSENTIAL 05/30/2010  . CARDIOMYOPATHY 05/13/2010  . CHF 05/13/2010  . Type 2 diabetes mellitus ([place]) 12/30/1998    No Known Allergies  Prior to Admission medications   Medication Sig Start Date End Date Taking? Authorizing Provider  aspirin 81 MG EC tablet Take 1 tablet (81 mg total) by mouth daily. 10/15/16  Yes Micheline Chapman, NP  atorvastatin (LIPITOR) 40 MG tablet Take 1 tablet (40 mg total) by mouth daily at 6 PM. 01/13/17  Yes Micheline Chapman, NP  carvedilol (COREG) 25 MG tablet Take 1 tablet (25 mg total) by mouth 2 (two) times daily with a meal. 01/13/17  Yes Micheline Chapman, NP  docusate sodium (COLACE) 100 MG capsule Take 100 mg by mouth daily.   Yes Historical Provider, MD  furosemide (LASIX) 40 MG tablet Take 1 tablet (40 mg total) by mouth daily. 01/13/17  Yes Micheline Chapman, NP  Insulin Glargine (LANTUS SOLOSTAR) 100 UNIT/ML Solostar Pen Inject 10 Units into the skin daily at 10 pm. 01/13/17  Yes Micheline Chapman, NP  insulin NPH-regular Human (NOVOLIN 70/30) (70-30)  100 UNIT/ML injection INJECT 10 UNITS INTO THE SKIN 2 TIMES DAILY WITH A MEAL. MAY USE [employer] RELION BRAND. 01/13/17  Yes Micheline Chapman, NP  Insulin Pen Needle (CAREFINE PEN NEEDLES) 31G X 6 MM MISC Check glucose fasting in am and before meals. 01/13/17  Yes Micheline Chapman, NP  Insulin Syringe-Needle U-100 (INSULIN SYRINGE .5CC/31GX5/16") 31G X 5/16" 0.5 ML MISC Use as per directions. 10/15/16  Yes Micheline Chapman, NP  lisinopril (PRINIVIL,ZESTRIL) 20 MG tablet Take 1 tablet (20 mg total) by mouth 2 (two) times daily. 01/13/17  Yes Micheline Chapman, NP  metFORMIN  (GLUCOPHAGE) 1000 MG tablet Take 1 tablet (1,000 mg total) by mouth 2 (two) times daily with a meal. 01/13/17  Yes Micheline Chapman, NP  spironolactone (ALDACTONE) 25 MG tablet Take 1 tablet (25 mg total) by mouth daily. 01/13/17  Yes Micheline Chapman, NP    Past Medical, Surgical Family and Social History reviewed and updated.    Objective:   Today's Vitals   04/09/17 1140  BP: (!) 144/82  Pulse: 85  Resp: 16  Temp: 97.6 F (36.4 C)  TempSrc: Oral  SpO2: 99%  Weight: 242 lb (109.8 kg)  Height: 6\' 1"  (1.854 m)    Wt Readings from Last 3 Encounters:  04/09/17 242 lb (109.8 kg)  04/07/17 242 lb (109.8 kg)  02/08/17 235 lb (106.6 kg)   Physical Exam  Constitutional: He is oriented to person, place, and time. He appears well-developed and well-nourished.  HENT:  Head: Normocephalic and atraumatic.  Nose: Nose normal.  Mouth/Throat: Oropharynx is clear and moist.  Eyes: Conjunctivae are normal. Pupils are equal, round, and reactive to light.  Neck: Neck supple.  Cardiovascular: Normal rate, regular rhythm and normal heart sounds.   Pulmonary/Chest: Effort normal and breath sounds normal.  Neurological: He is alert and oriented to person, place, and time.  Skin: Rash noted. Rash is macular. There is erythema.     Psychiatric: He has a normal mood and affect. His behavior is normal. Judgment and thought content normal.      Assessment & Plan:  1. Allergic reaction to drug, initial encounter - dexamethasone (DECADRON) injection 5 mg; Inject 0.5 mLs (5 mg total) into the muscle once. -At bedtime take a dose of Benadryl 12.5 mg. -Tomorrow, take 40 (pills) mg prednisone with breakfast and continue taking 40 mg (2 pills) for a total of 5 days. -Take Doxycyline 100 mg twice daily for 10 days, suspect mild cellulitis present as elbow is very warm and erythremic.  RTC: 2 days for reevaluation of arm  Carroll Sage. Kenton Kingfisher, MSN, [facility] [address] [place], [place] [address] [phone]

## 2017-04-10 ENCOUNTER — Other Ambulatory Visit: Payer: Self-pay | Admitting: Family Medicine

## 2017-04-10 MED ORDER — PREDNISONE 20 MG PO TABS: 40.0000 mg | ORAL_TABLET | Freq: Every day | ORAL | 0 refills | Status: DC

## 2017-04-10 MED ORDER — DOXYCYCLINE HYCLATE 100 MG PO CAPS: 100.0000 mg | ORAL_CAPSULE | Freq: Two times a day (BID) | ORAL | 0 refills | Status: DC

## 2017-04-10 MED FILL — BD INSUL SYR 0.5 ML 31GX15/: 31G X 15/64 | 30 days supply | Qty: 100 | Fill #0

## 2017-04-10 MED FILL — ?DOXYCYCLINE HYCLATE 100 MG: 100 | 10 days supply | Qty: 20 | Fill #0

## 2017-04-10 MED FILL — SPIRONOLACTONE 25 MG TABLET: 25 | 30 days supply | Qty: 30 | Fill #2

## 2017-04-10 MED FILL — LISINOPRIL 20 MG TABLET: 20 | 30 days supply | Qty: 60 | Fill #4

## 2017-04-10 MED FILL — FUROSEMIDE 40 MG TABLET: 40 | 30 days supply | Qty: 30 | Fill #2

## 2017-04-10 MED FILL — ?ATORVASTATIN 40MG TABLET: 40 | 30 days supply | Qty: 30 | Fill #2

## 2017-04-10 MED FILL — ?PREDNISONE 20 MG TABLET: 20 | 5 days supply | Qty: 10 | Fill #0

## 2017-04-10 MED FILL — !NOVOLIN 70/30 100 UNITS/ML: (70-30) 100 | 42 days supply | Qty: 10 | Fill #2

## 2017-04-10 MED FILL — ?METFORMIN HCL 1,000 MG TAB: 1000 | 30 days supply | Qty: 60 | Fill #2

## 2017-04-10 MED FILL — !LANTUS 100 UNITS/ML VIAL: 100 | 28 days supply | Qty: 10 | Fill #1

## 2017-04-10 MED FILL — CARVEDILOL 25 MG TABLET: 25 | 30 days supply | Qty: 60 | Fill #2

## 2017-04-28 ENCOUNTER — Other Ambulatory Visit: Payer: Self-pay | Admitting: *Deleted

## 2017-04-28 ENCOUNTER — Ambulatory Visit: Payer: Self-pay | Admitting: Physician Assistant

## 2017-04-28 MED ORDER — INSULIN NPH ISOPHANE & REGULAR (70-30) 100 UNIT/ML ~~LOC~~ SUSP: 10.0000 [IU] | Freq: Two times a day (BID) | SUBCUTANEOUS | 3 refills | Status: DC

## 2017-04-28 MED ORDER — INSULIN GLARGINE 100 UNIT/ML SOLOSTAR PEN: 10.0000 [IU] | PEN_INJECTOR | Freq: Every day | SUBCUTANEOUS | 3 refills | Status: DC

## 2017-04-28 NOTE — Telephone Encounter (Signed)
PRINTED FOR PASS PROGRAM 

## 2017-05-08 ENCOUNTER — Encounter: Payer: Self-pay | Admitting: Physician Assistant

## 2017-05-27 MED FILL — ?ATORVASTATIN 40MG TABLET: 40 | 30 days supply | Qty: 30 | Fill #1

## 2017-05-27 MED FILL — ?METFORMIN HCL 1,000 MG TAB: 1000 | 30 days supply | Qty: 60 | Fill #3

## 2017-05-27 MED FILL — ?SPIRONOLACTONE 25 MG TABLE: 25 | 30 days supply | Qty: 30 | Fill #3

## 2017-05-27 MED FILL — ?LISINOPRIL 20 MG TABLET: 20 | 30 days supply | Qty: 60 | Fill #5

## 2017-05-29 MED FILL — FUROSEMIDE 40 MG TABLET: 40 | 30 days supply | Qty: 30 | Fill #3

## 2017-06-30 MED FILL — ?SPIRONOLACTONE 25 MG TABLE: 25 | 30 days supply | Qty: 30 | Fill #4

## 2017-06-30 MED FILL — ?LISINOPRIL 20 MG TABLET: 20 | 30 days supply | Qty: 60 | Fill #6

## 2017-06-30 MED FILL — LANTUS 100 UNITS/ML VIAL: 100 | 28 days supply | Qty: 10 | Fill #2

## 2017-06-30 MED FILL — !NOVOLIN 70/30 100 UNITS/ML: (70-30) 100 | 42 days supply | Qty: 10 | Fill #3

## 2017-06-30 MED FILL — ?METFORMIN HCL 1,000 MG TAB: 1000 | 30 days supply | Qty: 60 | Fill #4

## 2017-06-30 MED FILL — ?FUROSEMIDE 40 MG TABLET: 40 | 30 days supply | Qty: 30 | Fill #4

## 2017-06-30 MED FILL — ?ATORVASTATIN 40MG TABLET: 40 | 30 days supply | Qty: 30 | Fill #2

## 2017-07-03 ENCOUNTER — Ambulatory Visit: Payer: Self-pay | Admitting: Neurology

## 2017-07-07 ENCOUNTER — Encounter: Payer: Self-pay | Admitting: Neurology

## 2017-07-09 ENCOUNTER — Encounter (HOSPITAL_COMMUNITY): Payer: Self-pay | Admitting: Emergency Medicine

## 2017-07-09 DIAGNOSIS — Z85038 Personal history of other malignant neoplasm of large intestine: Secondary | ICD-10-CM | POA: Insufficient documentation

## 2017-07-09 DIAGNOSIS — F17290 Nicotine dependence, other tobacco product, uncomplicated: Secondary | ICD-10-CM | POA: Insufficient documentation

## 2017-07-09 DIAGNOSIS — Z7982 Long term (current) use of aspirin: Secondary | ICD-10-CM | POA: Insufficient documentation

## 2017-07-09 DIAGNOSIS — Z794 Long term (current) use of insulin: Secondary | ICD-10-CM | POA: Insufficient documentation

## 2017-07-09 DIAGNOSIS — I110 Hypertensive heart disease with heart failure: Secondary | ICD-10-CM | POA: Insufficient documentation

## 2017-07-09 DIAGNOSIS — J399 Disease of upper respiratory tract, unspecified: Secondary | ICD-10-CM | POA: Insufficient documentation

## 2017-07-09 DIAGNOSIS — I509 Heart failure, unspecified: Secondary | ICD-10-CM | POA: Insufficient documentation

## 2017-07-09 DIAGNOSIS — Z8673 Personal history of transient ischemic attack (TIA), and cerebral infarction without residual deficits: Secondary | ICD-10-CM | POA: Insufficient documentation

## 2017-07-09 DIAGNOSIS — Z79899 Other long term (current) drug therapy: Secondary | ICD-10-CM | POA: Insufficient documentation

## 2017-07-09 DIAGNOSIS — E119 Type 2 diabetes mellitus without complications: Secondary | ICD-10-CM | POA: Insufficient documentation

## 2017-07-09 DIAGNOSIS — Z9049 Acquired absence of other specified parts of digestive tract: Secondary | ICD-10-CM | POA: Insufficient documentation

## 2017-07-09 LAB — COMPREHENSIVE METABOLIC PANEL
ALT: 10 U/L — AB (ref 17–63)
AST: 18 U/L (ref 15–41)
Albumin: 4 g/dL (ref 3.5–5.0)
Alkaline Phosphatase: 82 U/L (ref 38–126)
Anion gap: 9 (ref 5–15)
BUN: 23 mg/dL — ABNORMAL HIGH (ref 6–20)
CHLORIDE: 101 mmol/L (ref 101–111)
CO2: 21 mmol/L — AB (ref 22–32)
Calcium: 9.1 mg/dL (ref 8.9–10.3)
Creatinine, Ser: 1.14 mg/dL (ref 0.61–1.24)
GFR calc non Af Amer: >60 mL/min (ref >60)
Glucose, Bld: 226 mg/dL — ABNORMAL HIGH (ref 65–99)
POTASSIUM: 4.1 mmol/L (ref 3.5–5.1)
SODIUM: 131 mmol/L — AB (ref 135–145)
Total Bilirubin: 1 mg/dL (ref 0.3–1.2)
Total Protein: 7.2 g/dL (ref 6.5–8.1)

## 2017-07-09 LAB — CBC
HEMATOCRIT: 35 % — AB (ref 39.0–52.0)
Hemoglobin: 12 g/dL — ABNORMAL LOW (ref 13.0–17.0)
MCH: 28.9 pg (ref 26.0–34.0)
MCHC: 34.3 g/dL (ref 30.0–36.0)
MCV: 84.3 fL (ref 78.0–100.0)
PLATELETS: 206 10*3/uL (ref 150–400)
RBC: 4.15 MIL/uL — ABNORMAL LOW (ref 4.22–5.81)
RDW: 12.8 % (ref 11.5–15.5)
WBC: 11.9 10*3/uL — AB (ref 4.0–10.5)

## 2017-07-09 LAB — I-STAT TROPONIN, ED: TROPONIN I, POC: 0.04 ng/mL (ref 0.00–0.08)

## 2017-07-09 LAB — URINALYSIS, ROUTINE W REFLEX MICROSCOPIC
BACTERIA UA: NONE SEEN
BILIRUBIN URINE: NEGATIVE
Glucose, UA: 150 mg/dL — AB
KETONES UR: NEGATIVE mg/dL
Leukocytes, UA: NEGATIVE
Nitrite: NEGATIVE
PROTEIN: 100 mg/dL — AB
SQUAMOUS EPITHELIAL / LPF: NONE SEEN
Specific Gravity, Urine: 1.014 (ref 1.005–1.030)
pH: 5 (ref 5.0–8.0)

## 2017-07-09 LAB — LIPASE, BLOOD: LIPASE: 49 U/L (ref 11–51)

## 2017-07-09 MED ORDER — ACETAMINOPHEN 325 MG PO TABS
650.0000 mg | ORAL_TABLET | Freq: Once | ORAL | Status: AC
  Administered 2017-07-09: 650 mg via ORAL

## 2017-07-09 MED ORDER — ACETAMINOPHEN 325 MG PO TABS
ORAL_TABLET | ORAL | Status: AC
  Filled 2017-07-09: qty 2

## 2017-07-09 NOTE — ED Notes (Signed)
Pt presents to ED for assessment after feeling run down last night.  Pt then woke up this morning to emesis, and has been having intermittent emesis ever since.  Pt c/o headache, lower left back pain.  Denies abdominal pain, denies chest pain, denies sob, denies dizziness, c/o darker urine than normal.  Pt states oral intake has been minimized at home recently.

## 2017-07-10 ENCOUNTER — Emergency Department (HOSPITAL_COMMUNITY): Payer: Self-pay

## 2017-07-10 ENCOUNTER — Emergency Department (HOSPITAL_COMMUNITY)
Admission: EM | Admit: 2017-07-10 | Discharge: 2017-07-10 | Disposition: A | Discharge status: Home / Self Care | Payer: Self-pay | Attending: Emergency Medicine | Admitting: Emergency Medicine

## 2017-07-10 DIAGNOSIS — J069 Acute upper respiratory infection, unspecified: Secondary | ICD-10-CM

## 2017-07-10 MED ORDER — SODIUM CHLORIDE 0.9 % IV BOLUS (SEPSIS)
1000.0000 mL | Freq: Once | INTRAVENOUS | Status: AC
  Administered 2017-07-10: 1000 mL via INTRAVENOUS

## 2017-07-10 MED ORDER — BENZONATATE 100 MG PO CAPS: 200.0000 mg | ORAL_CAPSULE | Freq: Two times a day (BID) | ORAL | 0 refills | Status: DC | PRN

## 2017-07-10 NOTE — ED Provider Notes (Signed)
[facility] DEPT Provider Note   CSN: [id number] Arrival date & time: 07/09/17  2210     History   Chief Complaint Chief Complaint  Patient presents with  . Emesis    HPI Jared Hart is a 51 y.o. male.  Patient presents to the emergency department with chief complaint of cough. He reports having a cough times the past several days. He reports feeling generally fatigued. He also complains of some generalized body aches. He states that occasionally he has coughing fits that cause him to vomit. He reports that he has had fever at home to 101. He is noted to be febrile to 101.4 here. He denies any sore throat, chest pain, shortness of breath, abdominal pain, dysuria. He has tried OTC medications with some relief. He denies any other associated symptoms.   The history is provided by the patient. No language interpreter was used.    Past Medical History:  Diagnosis Date  . CHF (congestive heart failure) ([place])   . Colon cancer ([place])   . Diabetes mellitus   . HTN (hypertension)   . Hypercholesteremia   . Stroke [facility])     Patient Active Problem List   Diagnosis Date Noted  . Hyperlipidemia 07/09/2016  . Acute CVA (cerebrovascular accident) ([place]) 07/09/2016  . Cerebrovascular accident (CVA) due to occlusion of precerebral artery ([place])   . Stroke-like symptom 07/08/2016  . TIA (transient ischemic attack) 07/08/2016  . Chronic combined systolic (congestive) and diastolic (congestive) heart failure ([place]) 07/08/2016  . Fever 04/02/2015  . Bronchiolitis 04/02/2015  . Type 2 diabetes mellitus with hyperglycemia ([place]) 04/02/2015  . Hypokalemia 04/02/2015  . Febrile illness, acute   . Fatigue 09/28/2012  . Acute exacerbation of CHF (congestive heart failure) ([place]) 09/19/2012  . VARICOSE VEINS, LOWER EXTREMITIES 08/24/2010  . Obesity 06/29/2010  . SYSTOLIC HEART FAILURE, CHRONIC 06/01/2010  . Malignant neoplasm of colon ([place]) 05/30/2010  . HYPERLIPIDEMIA 05/30/2010  .  HYPERTENSION, BENIGN ESSENTIAL 05/30/2010  . CARDIOMYOPATHY 05/13/2010  . CHF 05/13/2010  . Type 2 diabetes mellitus ([place]) 12/30/1998    Past Surgical History:  Procedure Laterality Date  . APPENDECTOMY    . CHOLECYSTECTOMY    . COLON SURGERY         Home Medications    Prior to Admission medications   Medication Sig Start Date End Date Taking? Authorizing Provider  aspirin 81 MG EC tablet Take 1 tablet (81 mg total) by mouth daily. 10/15/16   Micheline Chapman, NP  atorvastatin (LIPITOR) 40 MG tablet Take 1 tablet (40 mg total) by mouth daily at 6 PM. 01/13/17   Micheline Chapman, NP  BD INSULIN SYRINGE ULTRAFINE 31G X 15/64" 0.3 ML MISC CHECK GLUCOSE FASTING IN AM AND BEFORE MEALS. 04/10/17   Scot Jun, FNP  carvedilol (COREG) 25 MG tablet Take 1 tablet (25 mg total) by mouth 2 (two) times daily with a meal. 01/13/17   Micheline Chapman, NP  docusate sodium (COLACE) 100 MG capsule Take 100 mg by mouth daily.    [provider]  doxycycline (VIBRAMYCIN) 100 MG capsule Take 1 capsule (100 mg total) by mouth 2 (two) times daily. 04/10/17   Scot Jun, FNP  furosemide (LASIX) 40 MG tablet Take 1 tablet (40 mg total) by mouth daily. 01/13/17   Micheline Chapman, NP  Insulin Glargine (LANTUS SOLOSTAR) 100 UNIT/ML Solostar Pen Inject 10 Units into the skin daily at 10 pm. 04/28/17   Tresa Garter, MD  insulin NPH-regular Human (HUMULIN 70/30) (70-30) 100 UNIT/ML injection Inject 10 Units into the skin 2 (two) times daily with a meal. 04/28/17   Jegede, Olugbemiga E, MD  insulin NPH-regular Human (NOVOLIN 70/30) (70-30) 100 UNIT/ML injection INJECT 10 UNITS INTO THE SKIN 2 TIMES DAILY WITH A MEAL. MAY USE [employer] RELION BRAND. 01/13/17   Micheline Chapman, NP  Insulin Syringe-Needle U-100 (INSULIN SYRINGE .5CC/31GX5/16") 31G X 5/16" 0.5 ML MISC Use as per directions. 10/15/16   Micheline Chapman, NP  lisinopril (PRINIVIL,ZESTRIL) 20 MG tablet Take 1 tablet (20  mg total) by mouth 2 (two) times daily. 01/13/17   Micheline Chapman, NP  metFORMIN (GLUCOPHAGE) 1000 MG tablet Take 1 tablet (1,000 mg total) by mouth 2 (two) times daily with a meal. 01/13/17   Micheline Chapman, NP  predniSONE (DELTASONE) 20 MG tablet Take 2 tablets (40 mg total) by mouth daily with breakfast. 04/10/17   Scot Jun, FNP  spironolactone (ALDACTONE) 25 MG tablet Take 1 tablet (25 mg total) by mouth daily. 01/13/17   Micheline Chapman, NP    Family History Family History  Problem Relation Age of Onset  . Diabetes Unknown   . Cancer Unknown   . Hypertension Unknown   . Hypertension Mother   . Diabetes Mother   . Cancer Mother   . Hypertension Father     Social History Social History  Substance Use Topics  . Smoking status: Never Smoker  . Smokeless tobacco: Current User    Types: Snuff  . Alcohol use No     Allergies   Patient has no known allergies.   Review of Systems Review of Systems  All other systems reviewed and are negative.    Physical Exam Updated Vital Signs BP 137/64   Pulse 96   Temp 99.5 F (37.5 C) (Oral)   Resp 15   SpO2 96%   Physical Exam  Constitutional: He is oriented to person, place, and time. He appears well-developed and well-nourished.  HENT:  Head: Normocephalic and atraumatic.  Eyes: Pupils are equal, round, and reactive to light. Conjunctivae and EOM are normal. Right eye exhibits no discharge. Left eye exhibits no discharge. No scleral icterus.  Neck: Normal range of motion. Neck supple. No JVD present.  Cardiovascular: Normal rate, regular rhythm and normal heart sounds.  Exam reveals no gallop and no friction rub.   No murmur heard. Pulmonary/Chest: Effort normal and breath sounds normal. No respiratory distress. He has no wheezes. He has no rales. He exhibits no tenderness.  Abdominal: Soft. He exhibits no distension and no mass. There is no tenderness. There is no rebound and no guarding.    Musculoskeletal: Normal range of motion. He exhibits no edema or tenderness.  Neurological: He is alert and oriented to person, place, and time.  Skin: Skin is warm and dry.  Psychiatric: He has a normal mood and affect. His behavior is normal. Judgment and thought content normal.  Nursing note and vitals reviewed.    ED Treatments / Results  Labs (all labs ordered are listed, but only abnormal results are displayed) Labs Reviewed  COMPREHENSIVE METABOLIC PANEL - Abnormal; Notable for the following:       Result Value   Sodium 131 (*)    CO2 21 (*)    Glucose, Bld 226 (*)    BUN 23 (*)    ALT 10 (*)    All other components within normal limits  CBC - Abnormal; Notable for  the following:    WBC 11.9 (*)    RBC 4.15 (*)    Hemoglobin 12.0 (*)    HCT 35.0 (*)    All other components within normal limits  URINALYSIS, ROUTINE W REFLEX MICROSCOPIC - Abnormal; Notable for the following:    Glucose, UA 150 (*)    Hgb urine dipstick MODERATE (*)    Protein, ur 100 (*)    All other components within normal limits  LIPASE, BLOOD  I-STAT TROPOININ, ED    EKG  EKG Interpretation  Date/Time:  Wednesday July 09 2017 22:16:27 EDT Ventricular Rate:  125 PR Interval:  142 QRS Duration: 100 QT Interval:  316 QTC Calculation: 456 R Axis:   -32 Text Interpretation:  Sinus tachycardia Left axis deviation T wave abnormality, consider lateral ischemia Abnormal ECG rate is faster, otherwise T wave changes similar to Feb 2018 Confirmed by Sherwood Gambler [phone]) on 07/10/2017 12:36:21 AM       Radiology Dg Chest 2 View  Result Date: 07/10/2017 CLINICAL DATA:  Cough and congestion EXAM: CHEST  2 VIEW COMPARISON:  07/08/2016, 04/01/2015 FINDINGS: The heart size and mediastinal contours are within normal limits. Both lungs are clear. Degenerative changes of the spine. IMPRESSION: No active cardiopulmonary disease. Electronically Signed   By: Donavan Foil M.D.   On: 07/10/2017 02:52     Procedures Procedures (including critical care time)  Medications Ordered in ED Medications  acetaminophen (TYLENOL) tablet 650 mg (650 mg Oral Given 07/09/17 2228)  sodium chloride 0.9 % bolus 1,000 mL (1,000 mLs Intravenous New Bag/Given 07/10/17 0242)     Initial Impression / Assessment and Plan / ED Course  I have reviewed the triage vital signs and the nursing notes.  Pertinent labs & imaging results that were available during my care of the patient were reviewed by me and considered in my medical decision making (see chart for details).     Pt CXR negative for acute infiltrate. Patients symptoms are consistent with URI, likely viral etiology. Discussed that antibiotics are not indicated for viral infections. Pt will be discharged with symptomatic treatment.  Verbalizes understanding and is agreeable with plan. Pt is hemodynamically stable & in NAD prior to dc.   Final Clinical Impressions(s) / ED Diagnoses   Final diagnoses:  Viral upper respiratory tract infection    New Prescriptions New Prescriptions   No medications on file     Montine Circle, Hershal Coria 07/10/17 2130    Sherwood Gambler, MD 07/10/17 1002

## 2017-07-10 NOTE — Discharge Instructions (Signed)
Take Tylenol for fever.  If your symptoms worsen, please see your doctor or return to the ER.  You need to rest and increase your fluids.

## 2017-07-14 ENCOUNTER — Encounter: Payer: Self-pay | Admitting: Family Medicine

## 2017-07-14 ENCOUNTER — Ambulatory Visit (INDEPENDENT_AMBULATORY_CARE_PROVIDER_SITE_OTHER): Payer: Self-pay | Admitting: Family Medicine

## 2017-07-14 VITALS — BP 178/90 | HR 100 | Temp 97.9°F | Resp 16 | Ht 73.0 in | Wt 241.0 lb

## 2017-07-14 DIAGNOSIS — E118 Type 2 diabetes mellitus with unspecified complications: Secondary | ICD-10-CM

## 2017-07-14 DIAGNOSIS — I10 Essential (primary) hypertension: Secondary | ICD-10-CM

## 2017-07-14 DIAGNOSIS — E785 Hyperlipidemia, unspecified: Secondary | ICD-10-CM

## 2017-07-14 LAB — POCT URINALYSIS DIP (DEVICE)
Bilirubin Urine: NEGATIVE
Glucose, UA: 500 mg/dL — AB
KETONES UR: NEGATIVE mg/dL
Leukocytes, UA: NEGATIVE
Nitrite: NEGATIVE
PH: 5.5 (ref 5.0–8.0)
PROTEIN: 100 mg/dL — AB
Specific Gravity, Urine: 1.02 (ref 1.005–1.030)
Urobilinogen, UA: 0.2 mg/dL (ref 0.0–1.0)

## 2017-07-14 LAB — CBC WITH DIFFERENTIAL/PLATELET
BASOS PCT: 0 %
Basophils Absolute: 0 cells/uL (ref 0–200)
EOS ABS: 192 {cells}/uL (ref 15–500)
Eosinophils Relative: 3 %
HEMATOCRIT: 36.4 % — AB (ref 38.5–50.0)
HEMOGLOBIN: 12 g/dL — AB (ref 13.2–17.1)
LYMPHS ABS: 1856 {cells}/uL (ref 850–3900)
LYMPHS PCT: 29 %
MCH: 29 pg (ref 27.0–33.0)
MCHC: 33 g/dL (ref 32.0–36.0)
MCV: 87.9 fL (ref 80.0–100.0)
MONO ABS: 384 {cells}/uL (ref 200–950)
MPV: 9.4 fL (ref 7.5–12.5)
Monocytes Relative: 6 %
NEUTROS ABS: 3968 {cells}/uL (ref 1500–7800)
Neutrophils Relative %: 62 %
Platelets: 293 10*3/uL (ref 140–400)
RBC: 4.14 MIL/uL — AB (ref 4.20–5.80)
RDW: 13.4 % (ref 11.0–15.0)
WBC: 6.4 10*3/uL (ref 3.8–10.8)

## 2017-07-14 LAB — POCT GLYCOSYLATED HEMOGLOBIN (HGB A1C): HEMOGLOBIN A1C: 9.5

## 2017-07-14 MED ORDER — AMLODIPINE BESYLATE 5 MG PO TABS: 5.0000 mg | ORAL_TABLET | Freq: Every day | ORAL | 1 refills | Status: DC

## 2017-07-14 MED ORDER — INSULIN GLARGINE 100 UNIT/ML SOLOSTAR PEN: 25.0000 [IU] | PEN_INJECTOR | Freq: Every day | SUBCUTANEOUS | 3 refills | Status: DC

## 2017-07-14 MED ORDER — LISINOPRIL 20 MG PO TABS: 20.0000 mg | ORAL_TABLET | Freq: Two times a day (BID) | ORAL | 6 refills | Status: DC

## 2017-07-14 MED FILL — AMLODIPINE BESYLATE 5 MG TA: 5 | 30 days supply | Qty: 30 | Fill #0

## 2017-07-14 NOTE — Patient Instructions (Addendum)
Cetirizine 10 mg one at bedtime for congestion.   Increase Lantus to 25 units at bedtime. Added amlodipine 5 mg once daily for blood pressure control. If glucose increases greater than 200 consistently please notify me here at the office. Your A1c today is 9.5 which is not at goal of less than 7. Increase exercise to at least 150 minutes weekly of vigorous exercise.   Diabetes Mellitus and Food It is important for you to manage your blood sugar (glucose) level. Your blood glucose level can be greatly affected by what you eat. Eating healthier foods in the appropriate amounts throughout the day at about the same time each day will help you control your blood glucose level. It can also help slow or prevent worsening of your diabetes mellitus. Healthy eating may even help you improve the level of your blood pressure and reach or maintain a healthy weight. General recommendations for healthful eating and cooking habits include:  Eating meals and snacks regularly. Avoid going long periods of time without eating to lose weight.  Eating a diet that consists mainly of plant-based foods, such as fruits, vegetables, nuts, legumes, and whole grains.  Using low-heat cooking methods, such as baking, instead of high-heat cooking methods, such as deep frying.  Work with your dietitian to make sure you understand how to use the Nutrition Facts information on food labels. How can food affect me? Carbohydrates Carbohydrates affect your blood glucose level more than any other type of food. Your dietitian will help you determine how many carbohydrates to eat at each meal and teach you how to count carbohydrates. Counting carbohydrates is important to keep your blood glucose at a healthy level, especially if you are using insulin or taking certain medicines for diabetes mellitus. Alcohol Alcohol can cause sudden decreases in blood glucose (hypoglycemia), especially if you use insulin or take certain medicines for  diabetes mellitus. Hypoglycemia can be a life-threatening condition. Symptoms of hypoglycemia (sleepiness, dizziness, and disorientation) are similar to symptoms of having too much alcohol. If your health care provider has given you approval to drink alcohol, do so in moderation and use the following guidelines:  Women should not have more than one drink per day, and men should not have more than two drinks per day. One drink is equal to: ? 12 oz of beer. ? 5 oz of wine. ? 1 oz of hard liquor.  Do not drink on an empty stomach.  Keep yourself hydrated. Have water, diet soda, or unsweetened iced tea.  Regular soda, juice, and other mixers might contain a lot of carbohydrates and should be counted.  What foods are not recommended? As you make food choices, it is important to remember that all foods are not the same. Some foods have fewer nutrients per serving than other foods, even though they might have the same number of calories or carbohydrates. It is difficult to get your body what it needs when you eat foods with fewer nutrients. Examples of foods that you should avoid that are high in calories and carbohydrates but low in nutrients include:  Trans fats (most processed foods list trans fats on the Nutrition Facts label).  Regular soda.  Juice.  Candy.  Sweets, such as cake, pie, doughnuts, and cookies.  Fried foods.  What foods can I eat? Eat nutrient-rich foods, which will nourish your body and keep you healthy. The food you should eat also will depend on several factors, including:  The calories you need.  The medicines you  take.  Your weight.  Your blood glucose level.  Your blood pressure level.  Your cholesterol level.  You should eat a variety of foods, including:  Protein. ? Lean cuts of meat. ? Proteins low in saturated fats, such as fish, egg whites, and beans. Avoid processed meats.  Fruits and vegetables. ? Fruits and vegetables that may help control  blood glucose levels, such as apples, mangoes, and yams.  Dairy products. ? Choose fat-free or low-fat dairy products, such as milk, yogurt, and cheese.  Grains, bread, pasta, and rice. ? Choose whole grain products, such as multigrain bread, whole oats, and brown rice. These foods may help control blood pressure.  Fats. ? Foods containing healthful fats, such as nuts, avocado, olive oil, canola oil, and fish.  Does everyone with diabetes mellitus have the same meal plan? Because every person with diabetes mellitus is different, there is not one meal plan that works for everyone. It is very important that you meet with a dietitian who will help you create a meal plan that is just right for you. This information is not intended to replace advice given to you by your health care provider. Make sure you discuss any questions you have with your health care provider. Document Released: 09/12/2005 Document Revised: 05/23/2016 Document Reviewed: 11/12/2013 Elsevier Interactive Patient Education  2017 [employer].

## 2017-07-14 NOTE — Progress Notes (Signed)
Patient ID: Jared Hart, male    DOB: [birth year], 51 y.o.   MRN: [id number]  PCP: Scot Jun, FNP  Chief Complaint  Patient presents with  . Follow-up    3 MONTH  . Hospitalization Follow-up    Subjective:  HPI Jared Hart is a 51 y.o. male presents for evaluation of diabetes and hypertension.  Medical problems include: Uncontrolled Diabetes, hypertension, hyperlipidemia, CVA, and CHF.  Hypertension  [name] reports no home monitoring of blood pressure.Reports adherence to blood pressure medications. Reports efforts to adhere to low sodium diet. He is a nonsmoker. Denies any episodes of dizziness,headaches, shortness of breath, or chest pain.  Diabetes  Reports that he is continuing to walk about 1-1.5 mile per day weekly. He drinks diet coke. Occasional sweets, twice per weeks. Eats a carbohydrate rich diet. Occasionally misses a dose of insulin. Reports adherence to oral medications. intermittently monitors blood glucose and reports some readings greater than 200.  Social History   Social History  . Marital status: Married    Spouse name: N/A  . Number of children: N/A  . Years of education: N/A   Occupational History  . Not on file.   Social History Main Topics  . Smoking status: Never Smoker  . Smokeless tobacco: Current User    Types: Snuff  . Alcohol use No  . Drug use: No  . Sexual activity: Not on file   Other Topics Concern  . Not on file   Social History Narrative  . No narrative on file    Family History  Problem Relation Age of Onset  . Diabetes Unknown   . Cancer Unknown   . Hypertension Unknown   . Hypertension Mother   . Diabetes Mother   . Cancer Mother   . Hypertension Father    Review of Systems See HPI Patient Active Problem List   Diagnosis Date Noted  . Hyperlipidemia 07/09/2016  . Acute CVA (cerebrovascular accident) ([place]) 07/09/2016  . Cerebrovascular accident (CVA) due to occlusion of precerebral artery  ([place])   . Stroke-like symptom 07/08/2016  . TIA (transient ischemic attack) 07/08/2016  . Chronic combined systolic (congestive) and diastolic (congestive) heart failure ([place]) 07/08/2016  . Fever 04/02/2015  . Bronchiolitis 04/02/2015  . Type 2 diabetes mellitus with hyperglycemia ([place]) 04/02/2015  . Hypokalemia 04/02/2015  . Febrile illness, acute   . Fatigue 09/28/2012  . Acute exacerbation of CHF (congestive heart failure) ([place]) 09/19/2012  . VARICOSE VEINS, LOWER EXTREMITIES 08/24/2010  . Obesity 06/29/2010  . SYSTOLIC HEART FAILURE, CHRONIC 06/01/2010  . Malignant neoplasm of colon ([place]) 05/30/2010  . HYPERLIPIDEMIA 05/30/2010  . HYPERTENSION, BENIGN ESSENTIAL 05/30/2010  . CARDIOMYOPATHY 05/13/2010  . CHF 05/13/2010  . Type 2 diabetes mellitus ([place]) 12/30/1998    No Known Allergies  Prior to Admission medications   Medication Sig Start Date End Date Taking? Authorizing Provider  aspirin 81 MG EC tablet Take 1 tablet (81 mg total) by mouth daily. 10/15/16  Yes Micheline Chapman, NP  atorvastatin (LIPITOR) 40 MG tablet Take 1 tablet (40 mg total) by mouth daily at 6 PM. 01/13/17  Yes Micheline Chapman, NP  BD INSULIN SYRINGE ULTRAFINE 31G X 15/64" 0.3 ML MISC CHECK GLUCOSE FASTING IN AM AND BEFORE MEALS. 04/10/17  Yes Scot Jun, FNP  carvedilol (COREG) 25 MG tablet Take 1 tablet (25 mg total) by mouth 2 (two) times daily with a meal. 01/13/17  Yes Micheline Chapman, NP  docusate sodium (COLACE) 100 MG capsule Take 100 mg by mouth daily.   Yes [provider]  furosemide (LASIX) 40 MG tablet Take 1 tablet (40 mg total) by mouth daily. 01/13/17  Yes Micheline Chapman, NP  Insulin Glargine (LANTUS SOLOSTAR) 100 UNIT/ML Solostar Pen Inject 10 Units into the skin daily at 10 pm. 04/28/17  Yes Tresa Garter, MD  insulin NPH-regular Human (HUMULIN 70/30) (70-30) 100 UNIT/ML injection Inject 10 Units into the skin 2 (two) times daily with a meal. 04/28/17  Yes  Jegede, Olugbemiga E, MD  insulin NPH-regular Human (NOVOLIN 70/30) (70-30) 100 UNIT/ML injection INJECT 10 UNITS INTO THE SKIN 2 TIMES DAILY WITH A MEAL. MAY USE [facility] RELION BRAND. 01/13/17  Yes Micheline Chapman, NP  Insulin Syringe-Needle U-100 (INSULIN SYRINGE .5CC/31GX5/16") 31G X 5/16" 0.5 ML MISC Use as per directions. 10/15/16  Yes Micheline Chapman, NP  lisinopril (PRINIVIL,ZESTRIL) 20 MG tablet Take 1 tablet (20 mg total) by mouth 2 (two) times daily. 01/13/17  Yes Micheline Chapman, NP  metFORMIN (GLUCOPHAGE) 1000 MG tablet Take 1 tablet (1,000 mg total) by mouth 2 (two) times daily with a meal. 01/13/17  Yes Micheline Chapman, NP  spironolactone (ALDACTONE) 25 MG tablet Take 1 tablet (25 mg total) by mouth daily. 01/13/17  Yes Micheline Chapman, NP  benzonatate (TESSALON) 100 MG capsule Take 2 capsules (200 mg total) by mouth 2 (two) times daily as needed for cough. Patient not taking: Reported on 07/14/2017 07/10/17   Montine Circle, PA-C    Past Medical, Surgical Family and Social History reviewed and updated.    Objective:   Today's Vitals   07/14/17 0832  BP: (!) 178/90  Pulse: 100  Resp: 16  Temp: 97.9 F (36.6 C)  TempSrc: Oral  SpO2: 100%  Weight: 241 lb (109.3 kg)  Height: 6\' 1"  (1.854 m)     Wt Readings from Last 3 Encounters:  07/14/17 241 lb (109.3 kg)  04/09/17 242 lb (109.8 kg)  04/07/17 242 lb (109.8 kg)   Physical Exam  Constitutional: He is oriented to person, place, and time. He appears well-developed and well-nourished.  HENT:  Head: Normocephalic and atraumatic.  Right Ear: External ear normal.  Left Ear: External ear normal.  Nose: Nose normal.  Mouth/Throat: Oropharynx is clear and moist.  Eyes: Pupils are equal, round, and reactive to light. Conjunctivae and EOM are normal.  Neck: Normal range of motion. Neck supple.  Cardiovascular: Normal rate, regular rhythm, normal heart sounds and intact distal pulses.   Pulmonary/Chest: Effort  normal and breath sounds normal.  Musculoskeletal: Normal range of motion.  Neurological: He is alert and oriented to person, place, and time.  Skin: Skin is warm and dry.  Psychiatric: He has a normal mood and affect. His behavior is normal. Judgment and thought content normal.    Assessment & Plan:  1. Type 2 diabetes mellitus with complication, without long-term current use of insulin ([place]), uncontrolled - POCT glycosylated hemoglobin (Hb A1C), 9.5, prior A1C 8.6 - Lipid panel - COMPLETE METABOLIC PANEL WITH GFR - CBC with Differential - Microalbumin / creatinine urine ratio -Increased Lantus 25 units at bedtime.  -Notify me if glucose is persistently greater than 200  2. Hyperlipidemia, unspecified hyperlipidemia type - Lipid panel -continue atorvastatin 40 mg once daily  3. HYPERTENSION, BENIGN ESSENTIAL, uncontrolled today. - Microalbumin / creatinine urine ratio - Continue lisinopril (PRINIVIL,ZESTRIL) 20 MG tablet;  -Adding Amlodipine 5 mg once daily to increase  blood pressure control.  RTC: 3 months for Diabetes and Hypertension follow-up   Carroll Sage. Kenton Kingfisher, MSN, FNP-C [facility]  [address] [place], [place] [id number] [phone]

## 2017-07-15 LAB — COMPLETE METABOLIC PANEL WITH GFR
ALT: 6 U/L — AB (ref 9–46)
AST: 12 U/L (ref 10–40)
Albumin: 4 g/dL (ref 3.6–5.1)
Alkaline Phosphatase: 84 U/L (ref 40–115)
BUN: 18 mg/dL (ref 7–25)
CALCIUM: 9 mg/dL (ref 8.6–10.3)
CHLORIDE: 101 mmol/L (ref 98–110)
CO2: 20 mmol/L (ref 20–31)
Creat: 1.11 mg/dL (ref 0.60–1.35)
GFR, EST NON AFRICAN AMERICAN: 78 mL/min (ref >=60)
GFR, Est African American: >89 mL/min (ref >=60)
Glucose, Bld: 308 mg/dL — ABNORMAL HIGH (ref 65–99)
POTASSIUM: 4.4 mmol/L (ref 3.5–5.3)
Sodium: 134 mmol/L — ABNORMAL LOW (ref 135–146)
Total Bilirubin: 0.3 mg/dL (ref 0.2–1.2)
Total Protein: 6.6 g/dL (ref 6.1–8.1)

## 2017-07-15 LAB — LIPID PANEL
CHOL/HDL RATIO: 5.3 ratio — AB (ref <5.0)
CHOLESTEROL: 137 mg/dL (ref <200)
HDL: 26 mg/dL — ABNORMAL LOW (ref >40)
LDL CALC: 66 mg/dL (ref <100)
TRIGLYCERIDES: 226 mg/dL — AB (ref <150)
VLDL: 45 mg/dL — AB (ref <30)

## 2017-07-15 MED FILL — $LANTUS SOLOSTAR 100 UNITS/: 100 | 36 days supply | Qty: 9 | Fill #0

## 2017-07-16 LAB — MICROALBUMIN / CREATININE URINE RATIO
Creatinine, Urine: 101 mg/dL (ref 20–370)
MICROALB UR: 50.7 mg/dL
MICROALB/CREAT RATIO: 502 ug/mg{creat} — AB (ref <30)

## 2017-07-23 ENCOUNTER — Other Ambulatory Visit: Payer: Self-pay | Admitting: Pharmacist

## 2017-07-23 MED ORDER — INSULIN PEN NEEDLE 31G X 6 MM MISC
12 refills | Status: DC
Start: 2017-07-23 — End: 2017-10-14

## 2017-07-30 ENCOUNTER — Other Ambulatory Visit: Payer: Self-pay | Admitting: Family Medicine

## 2017-07-30 MED FILL — LISINOPRIL 20 MG TAB: 20 | 30 days supply | Qty: 60 | Fill #0

## 2017-07-30 MED FILL — LANTUS 100 UNITS/ML VIAL: 100 | 28 days supply | Qty: 10 | Fill #3

## 2017-07-30 MED FILL — ?METFORMIN HCL 1,000 MG TAB: 1000 | 30 days supply | Qty: 60 | Fill #5

## 2017-07-30 MED FILL — ATORVASTATIN 40 MG TABLET: 40 | 30 days supply | Qty: 30 | Fill #0

## 2017-07-30 MED FILL — ?SPIRONOLACTONE 25 MG TABLE: 25 | 30 days supply | Qty: 30 | Fill #5

## 2017-07-30 MED FILL — !HUMULIN 70/30 VIAL: 70/30 | 30 days supply | Qty: 10 | Fill #0

## 2017-07-30 MED FILL — FUROSEMIDE 40 MG TABLET: 40 | 30 days supply | Qty: 30 | Fill #5

## 2017-09-03 ENCOUNTER — Other Ambulatory Visit: Payer: Self-pay | Admitting: Family Medicine

## 2017-09-03 MED FILL — BD INSUL SYR 0.5 ML 31GX15/: 31G X 15/64 | 30 days supply | Qty: 100 | Fill #0

## 2017-09-03 MED FILL — ?SPIRONOLACTONE 25 MG TABLE: 25 | 30 days supply | Qty: 30 | Fill #6

## 2017-09-03 MED FILL — FUROSEMIDE 40 MG TABLET: 40 | 30 days supply | Qty: 30 | Fill #6

## 2017-09-03 MED FILL — AMLODIPINE BESYLATE 5 MG TA: 5 | 30 days supply | Qty: 30 | Fill #1

## 2017-09-03 MED FILL — LISINOPRIL 20 MG TAB: 20 | 30 days supply | Qty: 60 | Fill #1

## 2017-10-14 ENCOUNTER — Encounter: Payer: Self-pay | Admitting: Family Medicine

## 2017-10-14 ENCOUNTER — Ambulatory Visit (INDEPENDENT_AMBULATORY_CARE_PROVIDER_SITE_OTHER): Payer: Self-pay | Admitting: Family Medicine

## 2017-10-14 ENCOUNTER — Ambulatory Visit: Payer: Self-pay | Admitting: Family Medicine

## 2017-10-14 VITALS — BP 160/84 | HR 96 | Temp 97.9°F | Wt 236.0 lb

## 2017-10-14 DIAGNOSIS — R809 Proteinuria, unspecified: Secondary | ICD-10-CM

## 2017-10-14 DIAGNOSIS — E7800 Pure hypercholesterolemia, unspecified: Secondary | ICD-10-CM

## 2017-10-14 DIAGNOSIS — Z23 Encounter for immunization: Secondary | ICD-10-CM

## 2017-10-14 DIAGNOSIS — I10 Essential (primary) hypertension: Secondary | ICD-10-CM

## 2017-10-14 DIAGNOSIS — E119 Type 2 diabetes mellitus without complications: Secondary | ICD-10-CM

## 2017-10-14 DIAGNOSIS — I5032 Chronic diastolic (congestive) heart failure: Secondary | ICD-10-CM

## 2017-10-14 LAB — CBC WITH DIFFERENTIAL/PLATELET
BASOS ABS: 39 {cells}/uL (ref 0–200)
Basophils Relative: 0.5 %
EOS PCT: 1.6 %
Eosinophils Absolute: 123 cells/uL (ref 15–500)
HEMATOCRIT: 37.1 % — AB (ref 38.5–50.0)
HEMOGLOBIN: 12.9 g/dL — AB (ref 13.2–17.1)
LYMPHS ABS: 2272 {cells}/uL (ref 850–3900)
MCH: 29.2 pg (ref 27.0–33.0)
MCHC: 34.8 g/dL (ref 32.0–36.0)
MCV: 83.9 fL (ref 80.0–100.0)
MPV: 9.9 fL (ref 7.5–12.5)
Monocytes Relative: 7.7 %
NEUTROS PCT: 60.7 %
Neutro Abs: 4674 cells/uL (ref 1500–7800)
Platelets: 299 10*3/uL (ref 140–400)
RBC: 4.42 10*6/uL (ref 4.20–5.80)
RDW: 12.7 % (ref 11.0–15.0)
Total Lymphocyte: 29.5 %
WBC mixed population: 593 cells/uL (ref 200–950)
WBC: 7.7 10*3/uL (ref 3.8–10.8)

## 2017-10-14 LAB — POCT GLYCOSYLATED HEMOGLOBIN (HGB A1C): HEMOGLOBIN A1C: 8.2

## 2017-10-14 LAB — POCT URINALYSIS DIP (DEVICE)
BILIRUBIN URINE: NEGATIVE
GLUCOSE, UA: 100 mg/dL — AB
KETONES UR: NEGATIVE mg/dL
Leukocytes, UA: NEGATIVE
NITRITE: NEGATIVE
PH: 5 (ref 5.0–8.0)
Specific Gravity, Urine: >=1.03 (ref 1.005–1.030)
Urobilinogen, UA: 0.2 mg/dL (ref 0.0–1.0)

## 2017-10-14 LAB — COMPLETE METABOLIC PANEL WITH GFR
AG RATIO: 1.6 (calc) (ref 1.0–2.5)
ALBUMIN MSPROF: 4.2 g/dL (ref 3.6–5.1)
ALKALINE PHOSPHATASE (APISO): 74 U/L (ref 40–115)
ALT: 5 U/L — ABNORMAL LOW (ref 9–46)
AST: 12 U/L (ref 10–40)
BUN: 15 mg/dL (ref 7–25)
CALCIUM: 9.3 mg/dL (ref 8.6–10.3)
CO2: 24 mmol/L (ref 20–32)
CREATININE: 0.99 mg/dL (ref 0.60–1.35)
Chloride: 101 mmol/L (ref 98–110)
GFR, EST NON AFRICAN AMERICAN: 89 mL/min/{1.73_m2} (ref >=60)
GFR, Est African American: 103 mL/min/{1.73_m2} (ref >=60)
GLOBULIN: 2.6 g/dL (ref 1.9–3.7)
Glucose, Bld: 195 mg/dL — ABNORMAL HIGH (ref 65–99)
POTASSIUM: 5 mmol/L (ref 3.5–5.3)
SODIUM: 135 mmol/L (ref 135–146)
Total Bilirubin: 0.3 mg/dL (ref 0.2–1.2)
Total Protein: 6.8 g/dL (ref 6.1–8.1)

## 2017-10-14 LAB — LIPID PANEL
CHOL/HDL RATIO: 5.8 (calc) — AB (ref <5.0)
Cholesterol: 193 mg/dL (ref <200)
HDL: 33 mg/dL — ABNORMAL LOW (ref >40)
NON-HDL CHOLESTEROL (CALC): 160 mg/dL — AB (ref <130)
Triglycerides: 461 mg/dL — ABNORMAL HIGH (ref <150)

## 2017-10-14 MED ORDER — SPIRONOLACTONE 25 MG PO TABS: 25.0000 mg | ORAL_TABLET | Freq: Every day | ORAL | 6 refills | Status: DC

## 2017-10-14 MED ORDER — AMLODIPINE BESYLATE 10 MG PO TABS: 10.0000 mg | ORAL_TABLET | Freq: Every day | ORAL | 2 refills | Status: DC

## 2017-10-14 MED ORDER — INSULIN NPH ISOPHANE & REGULAR (70-30) 100 UNIT/ML ~~LOC~~ SUSP: SUBCUTANEOUS | 5 refills | Status: DC

## 2017-10-14 MED ORDER — LISINOPRIL 20 MG PO TABS: 20.0000 mg | ORAL_TABLET | Freq: Two times a day (BID) | ORAL | 6 refills | Status: DC

## 2017-10-14 MED ORDER — METFORMIN HCL 1000 MG PO TABS: 1000.0000 mg | ORAL_TABLET | Freq: Two times a day (BID) | ORAL | 5 refills | Status: DC

## 2017-10-14 MED ORDER — ATORVASTATIN CALCIUM 40 MG PO TABS: ORAL_TABLET | ORAL | 2 refills | Status: DC

## 2017-10-14 MED ORDER — CLONIDINE HCL 0.1 MG PO TABS
0.1000 mg | ORAL_TABLET | Freq: Once | ORAL | Status: AC
  Administered 2017-10-14: 0.1 mg via ORAL

## 2017-10-14 MED ORDER — CARVEDILOL 25 MG PO TABS: 25.0000 mg | ORAL_TABLET | Freq: Two times a day (BID) | ORAL | 6 refills | Status: DC

## 2017-10-14 MED ORDER — INSULIN PEN NEEDLE 31G X 6 MM MISC: 12 refills | Status: DC

## 2017-10-14 MED FILL — TRUEPLUS PEN NDL 31G X 1/4: 31 G X1/4" | 30 days supply | Qty: 100 | Fill #0

## 2017-10-14 MED FILL — TRUEPLUS PEN NDL 31G X 1/4": 31 G X1/4" | 30 days supply | Qty: 100 | Fill #0

## 2017-10-14 MED FILL — LISINOPRIL 20 MG TABLET: 20 | 30 days supply | Qty: 60 | Fill #0

## 2017-10-14 MED FILL — ?CARVEDILOL 25 MG TABLET: 25 | 30 days supply | Qty: 60 | Fill #0

## 2017-10-14 MED FILL — AMLODIPINE BESYLATE 10 MG T: 10 | 30 days supply | Qty: 30 | Fill #0

## 2017-10-14 MED FILL — ?ATORVASTATIN 40MG TABLET: 40 | 30 days supply | Qty: 30 | Fill #0

## 2017-10-14 MED FILL — ?SPIRONOLACTONE 25 MG TABLE: 25 | 30 days supply | Qty: 30 | Fill #0

## 2017-10-14 MED FILL — metFORMIN HCL 1000 MG TABS: 1000 | 30 days supply | Qty: 60 | Fill #0

## 2017-10-14 MED FILL — ?HUMULIN 70/30 VIAL: (70-30) 100 | 28 days supply | Qty: 10 | Fill #0

## 2017-10-14 NOTE — Patient Instructions (Signed)
I have increased her amlodipine to 10 mg once daily. Continue all of her medications as prescribed.  If he continued to have headaches, please go to a retailer such as [facility] or [facility] and check your blood pressure. If you have any readings greater than 160/90 please follow up here in the office. I will have her return in 4 weeks for a blood pressure check only and  I will see back for an office visit in 3 months. You may take her Lantus anytime between 8 PM and 10 PM.    Hypertension Hypertension is another name for high blood pressure. High blood pressure forces your heart to work harder to pump blood. This can cause problems over time. There are two numbers in a blood pressure reading. There is a top number (systolic) over a bottom number (diastolic). It is best to have a blood pressure below 120/80. Healthy choices can help lower your blood pressure. You may need medicine to help lower your blood pressure if:  Your blood pressure cannot be lowered with healthy choices.  Your blood pressure is higher than 130/80.  Follow these instructions at home: Eating and drinking  If directed, follow the DASH eating plan. This diet includes: ? Filling half of your plate at each meal with fruits and vegetables. ? Filling one quarter of your plate at each meal with whole grains. Whole grains include whole wheat pasta, brown rice, and whole grain bread. ? Eating or drinking low-fat dairy products, such as skim milk or low-fat yogurt. ? Filling one quarter of your plate at each meal with low-fat (lean) proteins. Low-fat proteins include fish, skinless chicken, eggs, beans, and tofu. ? Avoiding fatty meat, cured and processed meat, or chicken with skin. ? Avoiding premade or processed food.  Eat less than 1,500 mg of salt (sodium) a day.  Limit alcohol use to no more than 1 drink a day for nonpregnant women and 2 drinks a day for men. One drink equals 12 oz of beer, 5 oz of wine, or 1 oz of hard  liquor. Lifestyle  Work with your doctor to stay at a healthy weight or to lose weight. Ask your doctor what the best weight is for you.  Get at least 30 minutes of exercise that causes your heart to beat faster (aerobic exercise) most days of the week. This may include walking, swimming, or biking.  Get at least 30 minutes of exercise that strengthens your muscles (resistance exercise) at least 3 days a week. This may include lifting weights or pilates.  Do not use any products that contain nicotine or tobacco. This includes cigarettes and e-cigarettes. If you need help quitting, ask your doctor.  Check your blood pressure at home as told by your doctor.  Keep all follow-up visits as told by your doctor. This is important. Medicines  Take over-the-counter and prescription medicines only as told by your doctor. Follow directions carefully.  Do not skip doses of blood pressure medicine. The medicine does not work as well if you skip doses. Skipping doses also puts you at risk for problems.  Ask your doctor about side effects or reactions to medicines that you should watch for. Contact a doctor if:  You think you are having a reaction to the medicine you are taking.  You have headaches that keep coming back (recurring).  You feel dizzy.  You have swelling in your ankles.  You have trouble with your vision. Get help right away if:  You get  a very bad headache.  You start to feel confused.  You feel weak or numb.  You feel faint.  You get very bad pain in your: ? Chest. ? Belly (abdomen).  You throw up (vomit) more than once.  You have trouble breathing. Summary  Hypertension is another name for high blood pressure.  Making healthy choices can help lower blood pressure. If your blood pressure cannot be controlled with healthy choices, you may need to take medicine. This information is not intended to replace advice given to you by your health care provider. Make sure  you discuss any questions you have with your health care provider. Document Released: 06/03/2008 Document Revised: 11/13/2016 Document Reviewed: 11/13/2016 Elsevier Interactive Patient Education  [employer].

## 2017-10-14 NOTE — Progress Notes (Signed)
Patient ID: Jared Hart, male    DOB: [birth year], 51 y.o.   MRN: [id number]  PCP: Scot Jun, FNP  Chief Complaint  Patient presents with  . Follow-up    3 MONTH    Subjective:  HPI Jared Hart is a 51 y.o. male presents for 3 month follow-up. Jared Hart suffers from heart failure, hx of CVA, uncontrolled hypertension and uncontrolled type 2 diabetes. Jared Hart was seen 3 months prior. His A1c during that visit was  9.5. He admits to inconsistently administering Lantus at bedtime. He doesn't routinely monitor his blood sugar or blood pressure. He continues to live a mostly sedentary lifestyle. Jared Hart recently lost his job and admits to eating more and unhealthy choices. He admits to inconsistently taking his antihypertensive medications. He reports increased headaches occurring at least twice weekly. Denies chest pain, no neuropathy symptoms, or  no generalized weakness. He is a nonsmoker and although continues to use smokeless tobacco. Current Body mass index is 31.14 kg/m. Social History   Social History  . Marital status: Married    Spouse name: N/A  . Number of children: N/A  . Years of education: N/A   Occupational History  . Not on file.   Social History Main Topics  . Smoking status: Never Smoker  . Smokeless tobacco: Current User    Types: Snuff  . Alcohol use No  . Drug use: No  . Sexual activity: Not on file   Other Topics Concern  . Not on file   Social History Narrative  . No narrative on file    Family History  Problem Relation Age of Onset  . Diabetes Unknown   . Cancer Unknown   . Hypertension Unknown   . Hypertension Mother   . Diabetes Mother   . Cancer Mother   . Hypertension Father    Review of Systems See HPI  Patient Active Problem List   Diagnosis Date Noted  . Hyperlipidemia 07/09/2016  . Acute CVA (cerebrovascular accident) ([place]) 07/09/2016  . Cerebrovascular accident (CVA) due to occlusion of precerebral artery ([place])   .  Stroke-like symptom 07/08/2016  . TIA (transient ischemic attack) 07/08/2016  . Chronic combined systolic (congestive) and diastolic (congestive) heart failure ([place]) 07/08/2016  . Fever 04/02/2015  . Bronchiolitis 04/02/2015  . Type 2 diabetes mellitus with hyperglycemia ([place]) 04/02/2015  . Hypokalemia 04/02/2015  . Febrile illness, acute   . Fatigue 09/28/2012  . Acute exacerbation of CHF (congestive heart failure) ([place]) 09/19/2012  . VARICOSE VEINS, LOWER EXTREMITIES 08/24/2010  . Obesity 06/29/2010  . SYSTOLIC HEART FAILURE, CHRONIC 06/01/2010  . Malignant neoplasm of colon ([place]) 05/30/2010  . HYPERLIPIDEMIA 05/30/2010  . HYPERTENSION, BENIGN ESSENTIAL 05/30/2010  . CARDIOMYOPATHY 05/13/2010  . CHF 05/13/2010  . Type 2 diabetes mellitus ([place]) 12/30/1998    No Known Allergies  Prior to Admission medications   Medication Sig Start Date End Date Taking? Authorizing Provider  amLODipine (NORVASC) 5 MG tablet Take 1 tablet (5 mg total) by mouth daily. 07/14/17  Yes Scot Jun, FNP  aspirin 81 MG EC tablet Take 1 tablet (81 mg total) by mouth daily. 10/15/16  Yes Micheline Chapman, NP  atorvastatin (LIPITOR) 40 MG tablet Take 1 tablet (40 mg total) by mouth daily at 6 PM. 01/13/17  Yes Micheline Chapman, NP  BD VEO INSULIN SYR ULTRAFINE 31G X 15/64" 0.5 ML MISC CHECK GLUCOSE FASTING IN AM AND BEFORE MEALS. 09/03/17  Yes Scot Jun, FNP  benzonatate (  TESSALON) 100 MG capsule Take 2 capsules (200 mg total) by mouth 2 (two) times daily as needed for cough. 07/10/17  Yes Montine Circle, PA-C  carvedilol (COREG) 25 MG tablet Take 1 tablet (25 mg total) by mouth 2 (two) times daily with a meal. 01/13/17  Yes Micheline Chapman, NP  docusate sodium (COLACE) 100 MG capsule Take 100 mg by mouth daily.   Yes [provider]  furosemide (LASIX) 40 MG tablet Take 1 tablet (40 mg total) by mouth daily. 01/13/17  Yes Micheline Chapman, NP  Insulin Glargine (LANTUS SOLOSTAR) 100  UNIT/ML Solostar Pen Inject 25 Units into the skin daily at 10 pm. 07/14/17  Yes Scot Jun, FNP  insulin NPH-regular Human (HUMULIN 70/30) (70-30) 100 UNIT/ML injection Inject 10 Units into the skin 2 (two) times daily with a meal. 04/28/17  Yes Jegede, Olugbemiga E, MD  Insulin Pen Needle (ULTICARE MINI PEN NEEDLES) 31G X 6 MM MISC Use as directed 07/23/17  Yes Jegede, Olugbemiga E, MD  Insulin Syringe-Needle U-100 (INSULIN SYRINGE .5CC/31GX5/16") 31G X 5/16" 0.5 ML MISC Use as per directions. 10/15/16  Yes Micheline Chapman, NP  lisinopril (PRINIVIL,ZESTRIL) 20 MG tablet Take 1 tablet (20 mg total) by mouth 2 (two) times daily. 07/14/17  Yes Scot Jun, FNP  metFORMIN (GLUCOPHAGE) 1000 MG tablet Take 1 tablet (1,000 mg total) by mouth 2 (two) times daily with a meal. 01/13/17  Yes Micheline Chapman, NP  NOVOLIN 70/30 (70-30) 100 UNIT/ML injection INJECT 10 UNITS INTO THE SKIN 2 TIMES DAILY WITH A MEAL. 07/30/17  Yes Scot Jun, FNP  spironolactone (ALDACTONE) 25 MG tablet Take 1 tablet (25 mg total) by mouth daily. 01/13/17  Yes Micheline Chapman, NP  atorvastatin (LIPITOR) 40 MG tablet TAKE 1 TABLET BY MOUTH DAILY AT 6 PM. Patient not taking: Reported on 10/14/2017 07/30/17   Scot Jun, FNP    Past Medical, Surgical Family and Social History reviewed and updated.    Objective:   Today's Vitals   10/14/17 1453  BP: (!) 177/94  Pulse: 96  Temp: 97.9 F (36.6 C)  TempSrc: Oral  SpO2: 99%  Weight: 236 lb (107 kg)    Wt Readings from Last 3 Encounters:  10/14/17 236 lb (107 kg)  07/14/17 241 lb (109.3 kg)  04/09/17 242 lb (109.8 kg)   Physical Exam  Constitutional: He is oriented to person, place, and time. He appears well-developed and well-nourished.  HENT:  Head: Normocephalic and atraumatic.  Eyes: Pupils are equal, round, and reactive to light. Conjunctivae and EOM are normal.  Neck: Normal range of motion. Neck supple.  Cardiovascular: Normal  rate, regular rhythm, normal heart sounds and intact distal pulses.  Exam reveals no gallop and no friction rub.   No murmur heard. Pulmonary/Chest: Effort normal and breath sounds normal.  Abdominal: Soft. Bowel sounds are normal.  Musculoskeletal: Normal range of motion.  Neurological: He is alert and oriented to person, place, and time.  Skin: Skin is warm and dry.  Psychiatric: He has a normal mood and affect. His behavior is normal. Judgment and thought content normal.   Assessment & Plan:  1. Controlled type 2 diabetes mellitus without complication, without long-term current use of insulin (HCC) improving, uncontrolled. A1C today 8.2. Resume current antidiabetes medication regimen. 2. Essential hypertension, uncontrolled  - lisinopril (PRINIVIL,ZESTRIL) 20 MG tablet, twice daily  -Increased amlodipine 10 mg once daily  - cloNIDine (CATAPRES) tablet 0.1 mg, once. -Return 3  weeks for BP check only and 3 months for chronic disease management  3. Pure hypercholesterolemia, repeat Lipid panel, patient fasting today 4. Proteinuria, unspecified type, currently on ACE- Microalbumin/Creatinine Ratio, Urine 5. Needs flu shot- Flu Vaccine QUAD 6+ mos PF IM (Fluarix Quad PF) 6. Chronic diastolic congestive heart failure ([place]), stable, asymptomatic    Orders Placed This Encounter  Procedures  . Flu Vaccine QUAD 6+ mos PF IM (Fluarix Quad PF)  . CBC with Differential  . COMPLETE METABOLIC PANEL WITH GFR  . Lipid panel  . Microalbumin/Creatinine Ratio, Urine  . POCT glycosylated hemoglobin (Hb A1C)  . POCT urinalysis dip (device)    Meds ordered this encounter  Medications  . amLODipine (NORVASC) 10 MG tablet    Sig: Take 1 tablet (10 mg total) by mouth daily.    Dispense:  90 tablet    Refill:  2    Order Specific Question:   Supervising Provider    Answer:   Tresa Garter [id number]  . carvedilol (COREG) 25 MG tablet    Sig: Take 1 tablet (25 mg total) by mouth 2 (two) times  daily with a meal.    Dispense:  60 tablet    Refill:  6    Order Specific Question:   Supervising Provider    Answer:   Tresa Garter [id number]  . atorvastatin (LIPITOR) 40 MG tablet    Sig: TAKE 1 TABLET BY MOUTH DAILY AT 6 PM.    Dispense:  90 tablet    Refill:  2    Order Specific Question:   Supervising Provider    Answer:   Tresa Garter [id number]  . lisinopril (PRINIVIL,ZESTRIL) 20 MG tablet    Sig: Take 1 tablet (20 mg total) by mouth 2 (two) times daily.    Dispense:  60 tablet    Refill:  6    Order Specific Question:   Supervising Provider    Answer:   Tresa Garter [id number]  . metFORMIN (GLUCOPHAGE) 1000 MG tablet    Sig: Take 1 tablet (1,000 mg total) by mouth 2 (two) times daily with a meal.    Dispense:  180 tablet    Refill:  5    Order Specific Question:   Supervising Provider    Answer:   Tresa Garter [id number]  . spironolactone (ALDACTONE) 25 MG tablet    Sig: Take 1 tablet (25 mg total) by mouth daily.    Dispense:  30 tablet    Refill:  6    Order Specific Question:   Supervising Provider    Answer:   Tresa Garter [id number]  . Insulin Pen Needle (ULTICARE MINI PEN NEEDLES) 31G X 6 MM MISC    Sig: Use as directed    Dispense:  100 each    Refill:  12    Order Specific Question:   Supervising Provider    Answer:   Tresa Garter [[id number]]  . insulin NPH-regular Human (NOVOLIN 70/30) (70-30) 100 UNIT/ML injection    Sig: INJECT 10 UNITS INTO THE SKIN 2 TIMES DAILY WITH A MEAL.    Dispense:  10 mL    Refill:  5    Order Specific Question:   Supervising Provider    Answer:   Tresa Garter [id number]  . cloNIDine (CATAPRES) tablet 0.1 mg  . gemfibrozil (LOPID) 600 MG tablet    Sig: Take 1 tablet (600 mg total) by mouth 2 (two) times daily before a meal.  Dispense:  60 tablet    Refill:  2    Order Specific Question:   Supervising Provider    Answer:   Tresa Garter [id number]      RTC: 3  months or sooner if needed.  Carroll Sage. Kenton Kingfisher, MSN, FNP-C [facility]  [address] Cower [place], [place] [address] [phone]

## 2017-10-15 LAB — MICROALBUMIN / CREATININE URINE RATIO
Creatinine, Urine: 150 mg/dL (ref 20–320)
MICROALB UR: 129.2 mg/dL
Microalb Creat Ratio: 861 mcg/mg creat — ABNORMAL HIGH (ref <30)

## 2017-10-15 MED ORDER — GEMFIBROZIL 600 MG PO TABS: 600.0000 mg | ORAL_TABLET | Freq: Two times a day (BID) | ORAL | 2 refills | Status: DC

## 2017-10-16 MED FILL — GEMFIBROZIL 600 MG TABLET: 600 | 30 days supply | Qty: 60 | Fill #0

## 2017-10-20 ENCOUNTER — Emergency Department (HOSPITAL_COMMUNITY)
Admission: EM | Admit: 2017-10-20 | Discharge: 2017-10-21 | Disposition: A | Discharge status: Home / Self Care | Payer: Self-pay | Attending: Emergency Medicine | Admitting: Emergency Medicine

## 2017-10-20 ENCOUNTER — Encounter (HOSPITAL_COMMUNITY): Payer: Self-pay | Admitting: Emergency Medicine

## 2017-10-20 ENCOUNTER — Emergency Department (HOSPITAL_COMMUNITY): Payer: Self-pay

## 2017-10-20 DIAGNOSIS — I5042 Chronic combined systolic (congestive) and diastolic (congestive) heart failure: Secondary | ICD-10-CM | POA: Insufficient documentation

## 2017-10-20 DIAGNOSIS — R42 Dizziness and giddiness: Secondary | ICD-10-CM | POA: Insufficient documentation

## 2017-10-20 DIAGNOSIS — I110 Hypertensive heart disease with heart failure: Secondary | ICD-10-CM | POA: Insufficient documentation

## 2017-10-20 DIAGNOSIS — E119 Type 2 diabetes mellitus without complications: Secondary | ICD-10-CM | POA: Insufficient documentation

## 2017-10-20 DIAGNOSIS — Z79899 Other long term (current) drug therapy: Secondary | ICD-10-CM | POA: Insufficient documentation

## 2017-10-20 DIAGNOSIS — F17220 Nicotine dependence, chewing tobacco, uncomplicated: Secondary | ICD-10-CM | POA: Insufficient documentation

## 2017-10-20 DIAGNOSIS — Z794 Long term (current) use of insulin: Secondary | ICD-10-CM | POA: Insufficient documentation

## 2017-10-20 DIAGNOSIS — Z7982 Long term (current) use of aspirin: Secondary | ICD-10-CM | POA: Insufficient documentation

## 2017-10-20 LAB — CBC
HCT: 35.6 % — ABNORMAL LOW (ref 39.0–52.0)
Hemoglobin: 12.1 g/dL — ABNORMAL LOW (ref 13.0–17.0)
MCH: 28.9 pg (ref 26.0–34.0)
MCHC: 34 g/dL (ref 30.0–36.0)
MCV: 85 fL (ref 78.0–100.0)
PLATELETS: 237 10*3/uL (ref 150–400)
RBC: 4.19 MIL/uL — ABNORMAL LOW (ref 4.22–5.81)
RDW: 12.7 % (ref 11.5–15.5)
WBC: 14.7 10*3/uL — ABNORMAL HIGH (ref 4.0–10.5)

## 2017-10-20 LAB — BASIC METABOLIC PANEL
Anion gap: 8 (ref 5–15)
BUN: 15 mg/dL (ref 6–20)
CHLORIDE: 102 mmol/L (ref 101–111)
CO2: 24 mmol/L (ref 22–32)
Calcium: 9.6 mg/dL (ref 8.9–10.3)
Creatinine, Ser: 1.01 mg/dL (ref 0.61–1.24)
GFR calc Af Amer: >60 mL/min (ref >60)
GFR calc non Af Amer: >60 mL/min (ref >60)
Glucose, Bld: 306 mg/dL — ABNORMAL HIGH (ref 65–99)
POTASSIUM: 4.6 mmol/L (ref 3.5–5.1)
SODIUM: 134 mmol/L — AB (ref 135–145)

## 2017-10-20 NOTE — ED Notes (Signed)
ED Provider at bedside. 

## 2017-10-20 NOTE — ED Triage Notes (Signed)
Per EMS, pt from home. Pt reports checking his BP in the morning, which was high. Pt started a new BP medication yesterday, unknown which one. Pt took his new medication at 8am and at 5am. This evening, pt took his medicine and about an hour later he was getting into the shower, when he felt dizzy and lightheaded, then slipped and fell with no LOC. Upon Fire arrival, BP 91/42, P 66, R 18, 92% SpO2 RA. 268 CBG. When EMS arrived, manual BP was 138/72, 66 P, 96% on RA. Pt has hx of stroke with no left sided deficits, CHF, HTN, diabetes, and hyperlipidemia.

## 2017-10-20 NOTE — ED Notes (Signed)
Patient transported to CT 

## 2017-10-20 NOTE — ED Provider Notes (Signed)
[facility] EMERGENCY DEPARTMENT Provider Note   CSN: [id number] Arrival date & time: 10/20/17  2234     History   Chief Complaint Chief Complaint  Patient presents with  . Dizziness    HPI Jared Hart is a 51 y.o. male.  HPI  This is a 51 year old male with a history of CHF, colon cancer, diabetes, hypertension, CVA who presents with dizziness and a fall. Patient reports recent increase in his amlodipine to 10 mg. Today was the first day that he took this. He states that he took his 8 AM dose and 5 PM dose. He was taking a bath when he noted "feeling drunk." He reports lightheadedness without room spinning dizziness. He went to sit down and slipped and fell. Denies syncope or loss of consciousness. Wife noted that after this he appeared diaphoretic and disoriented. Patient reported generalized weakness. No focal weakness or numbness. Per EMS report, was hypotensive at the scene with a systolic blood pressure in the 90s. Patient denied any chest pain, shortness of breath, abdominal pain, fevers, any recent illnesses. Currently he states he feels much better.  Past Medical History:  Diagnosis Date  . CHF (congestive heart failure) ([place])   . Colon cancer ([place])   . Diabetes mellitus   . HTN (hypertension)   . Hypercholesteremia   . Stroke [facility])     Patient Active Problem List   Diagnosis Date Noted  . Hyperlipidemia 07/09/2016  . Acute CVA (cerebrovascular accident) ([place]) 07/09/2016  . Cerebrovascular accident (CVA) due to occlusion of precerebral artery ([place])   . Stroke-like symptom 07/08/2016  . TIA (transient ischemic attack) 07/08/2016  . Chronic combined systolic (congestive) and diastolic (congestive) heart failure ([place]) 07/08/2016  . Fever 04/02/2015  . Bronchiolitis 04/02/2015  . Type 2 diabetes mellitus with hyperglycemia ([place]) 04/02/2015  . Hypokalemia 04/02/2015  . Febrile illness, acute   . Fatigue 09/28/2012  . Acute exacerbation of CHF  (congestive heart failure) ([place]) 09/19/2012  . VARICOSE VEINS, LOWER EXTREMITIES 08/24/2010  . Obesity 06/29/2010  . SYSTOLIC HEART FAILURE, CHRONIC 06/01/2010  . Malignant neoplasm of colon ([place]) 05/30/2010  . HYPERLIPIDEMIA 05/30/2010  . HYPERTENSION, BENIGN ESSENTIAL 05/30/2010  . CARDIOMYOPATHY 05/13/2010  . CHF 05/13/2010  . Type 2 diabetes mellitus ([place]) 12/30/1998    Past Surgical History:  Procedure Laterality Date  . APPENDECTOMY    . CHOLECYSTECTOMY    . COLON SURGERY         Home Medications    Prior to Admission medications   Medication Sig Start Date End Date Taking? Authorizing Provider  amLODipine (NORVASC) 10 MG tablet Take 1 tablet (10 mg total) by mouth daily. 10/14/17  Yes Scot Jun, FNP  aspirin 81 MG EC tablet Take 1 tablet (81 mg total) by mouth daily. 10/15/16  Yes Micheline Chapman, NP  atorvastatin (LIPITOR) 40 MG tablet TAKE 1 TABLET BY MOUTH DAILY AT 6 PM. 10/14/17  Yes Scot Jun, FNP  carvedilol (COREG) 25 MG tablet Take 1 tablet (25 mg total) by mouth 2 (two) times daily with a meal. 10/14/17  Yes Scot Jun, FNP  docusate sodium (COLACE) 100 MG capsule Take 100 mg by mouth daily.   Yes [provider]  furosemide (LASIX) 40 MG tablet Take 1 tablet (40 mg total) by mouth daily. 01/13/17  Yes Micheline Chapman, NP  gemfibrozil (LOPID) 600 MG tablet Take 1 tablet (600 mg total) by mouth 2 (two) times daily before a meal.  10/15/17  Yes Scot Jun, FNP  Insulin Glargine (LANTUS SOLOSTAR) 100 UNIT/ML Solostar Pen Inject 25 Units into the skin daily at 10 pm. 07/14/17  Yes Scot Jun, FNP  insulin NPH-regular Human (NOVOLIN 70/30) (70-30) 100 UNIT/ML injection INJECT 10 UNITS INTO THE SKIN 2 TIMES DAILY WITH A MEAL. 10/14/17  Yes Scot Jun, FNP  lisinopril (PRINIVIL,ZESTRIL) 20 MG tablet Take 1 tablet (20 mg total) by mouth 2 (two) times daily. 10/14/17  Yes Scot Jun, FNP  metFORMIN  (GLUCOPHAGE) 1000 MG tablet Take 1 tablet (1,000 mg total) by mouth 2 (two) times daily with a meal. 10/14/17  Yes Scot Jun, FNP  spironolactone (ALDACTONE) 25 MG tablet Take 1 tablet (25 mg total) by mouth daily. 10/14/17  Yes Scot Jun, FNP  BD VEO INSULIN SYR ULTRAFINE 31G X 15/64" 0.5 ML MISC CHECK GLUCOSE FASTING IN AM AND BEFORE MEALS. 09/03/17   Scot Jun, FNP  benzonatate (TESSALON) 100 MG capsule Take 2 capsules (200 mg total) by mouth 2 (two) times daily as needed for cough. Patient not taking: Reported on 10/21/2017 07/10/17   Montine Circle, PA-C  Insulin Pen Needle Flossie Buffy MINI PEN NEEDLES) 31G X 6 MM MISC Use as directed 10/14/17   Scot Jun, FNP  Insulin Syringe-Needle U-100 (INSULIN SYRINGE .5CC/31GX5/16") 31G X 5/16" 0.5 ML MISC Use as per directions. 10/15/16   Micheline Chapman, NP    Family History Family History  Problem Relation Age of Onset  . Diabetes Unknown   . Cancer Unknown   . Hypertension Unknown   . Hypertension Mother   . Diabetes Mother   . Cancer Mother   . Hypertension Father     Social History Social History  Substance Use Topics  . Smoking status: Never Smoker  . Smokeless tobacco: Current User    Types: Snuff  . Alcohol use No     Allergies   Patient has no known allergies.   Review of Systems Review of Systems  Constitutional: Positive for diaphoresis. Negative for fever.  Respiratory: Negative for shortness of breath.   Cardiovascular: Negative for chest pain.  Gastrointestinal: Negative for abdominal pain.  Neurological: Positive for dizziness and light-headedness. Negative for weakness and numbness.  Psychiatric/Behavioral: Negative for confusion.  All other systems reviewed and are negative.    Physical Exam Updated Vital Signs BP 125/76   Pulse 72   Temp 97.7 F (36.5 C) (Oral)   Resp (!) 22   Ht 6\' 1"  (1.854 m)   Wt 102.5 kg (226 lb)   SpO2 99%   BMI 29.82 kg/m   Physical  Exam  Constitutional: He is oriented to person, place, and time. He appears well-developed and well-nourished. No distress.  overweight  HENT:  Head: Normocephalic and atraumatic.  Eyes: Pupils are equal, round, and reactive to light.  Cardiovascular: Normal rate, regular rhythm and normal heart sounds.   No murmur heard. Pulmonary/Chest: Effort normal and breath sounds normal. No respiratory distress. He has no wheezes.  Abdominal: Soft. Bowel sounds are normal. There is no tenderness. There is no rebound.  Musculoskeletal: He exhibits no edema.  Neurological: He is alert and oriented to person, place, and time.  Cranial nerves II through XII intact, 5 out of 5 strength in all 4 extremities, no dysmetria to finger-nose-finger  Skin: Skin is warm and dry.  Psychiatric: He has a normal mood and affect.  Nursing note and vitals reviewed.    ED Treatments /  Results  Labs (all labs ordered are listed, but only abnormal results are displayed) Labs Reviewed  BASIC METABOLIC PANEL - Abnormal; Notable for the following:       Result Value   Sodium 134 (*)    Glucose, Bld 306 (*)    All other components within normal limits  CBC - Abnormal; Notable for the following:    WBC 14.7 (*)    RBC 4.19 (*)    Hemoglobin 12.1 (*)    HCT 35.6 (*)    All other components within normal limits  URINALYSIS, ROUTINE W REFLEX MICROSCOPIC - Abnormal; Notable for the following:    Glucose, UA >=500 (*)    Hgb urine dipstick SMALL (*)    Protein, ur 100 (*)    Bacteria, UA RARE (*)    Squamous Epithelial / LPF 0-5 (*)    All other components within normal limits  CBG MONITORING, ED    EKG  EKG Interpretation  Date/Time:  Monday October 20 2017 22:44:37 EDT Ventricular Rate:  66 PR Interval:    QRS Duration: 107 QT Interval:  436 QTC Calculation: 457 R Axis:   -21 Text Interpretation:  Sinus rhythm Incomplete left bundle branch block Low voltage, precordial leads  ----------unconfirmed---------- Slower than prior Confirmed by Thayer Jew [phone]) on 10/20/2017 11:02:06 PM       Radiology Ct Head Wo Contrast  Result Date: 10/21/2017 CLINICAL DATA:  Status post fall, with dizziness and lightheadedness. Initial encounter. EXAM: CT HEAD WITHOUT CONTRAST TECHNIQUE: Contiguous axial images were obtained from the base of the skull through the vertex without intravenous contrast. COMPARISON:  CT of the head and MRI of the brain performed 07/08/2016 FINDINGS: Brain: No evidence of acute infarction, hemorrhage, hydrocephalus, extra-axial collection or mass lesion/mass effect. A chronic infarct is noted at the right corona radiata and right basal ganglia. The posterior fossa, including the cerebellum, brainstem and fourth ventricle, is within normal limits. The third and lateral ventricles are unremarkable in appearance. No mass effect or midline shift is seen. Vascular: No hyperdense vessel or unexpected calcification. Skull: There is no evidence of fracture; multiple maxillary dental caries are noted, with underlying periapical abscesses. Sinuses/Orbits: The orbits are within normal limits. Mucosal thickening is noted at the maxillary sinuses bilaterally. The remaining paranasal sinuses and mastoid air cells are well-aerated. Other: No significant soft tissue abnormalities are seen. IMPRESSION: 1. No acute intracranial pathology seen on CT. 2. Chronic infarct at the right corona radiata and right basal ganglia. 3. Mucosal thickening at the maxillary sinuses bilaterally. 4. Multiple maxillary dental caries, with underlying periapical abscesses. Electronically Signed   By: Garald Balding M.D.   On: 10/21/2017 00:20    Procedures Procedures (including critical care time)  Medications Ordered in ED Medications - No data to display   Initial Impression / Assessment and Plan / ED Course  I have reviewed the triage vital signs and the nursing notes.  Pertinent labs  & imaging results that were available during my care of the patient were reviewed by me and considered in my medical decision making (see chart for details).     Patient presents with dizziness. He is currently asymptomatic. States that this is in the setting of recentincrease in blood pressure medication. Here he is normotensive but reports being hypotensive by EMS. Baseline blood pressures 160s over 100s. No focal abnormalities on exam. Workup is largely reassuring. He is hyperglycemic without anion gap. CT head is negative. EKG shows no evidence of arrhythmia.  Suspect he is blood pressure may have been dropped too precipitously causing him to feel dizzy. Recommend close follow-up with PCP for further adjustments of blood pressure medication. Would decreased blood pressure medications back to 5 mg.  After history, exam, and medical workup I feel the patient has been appropriately medically screened and is safe for discharge home. Pertinent diagnoses were discussed with the patient. Patient was given return precautions.  Final Clinical Impressions(s) / ED Diagnoses   Final diagnoses:  Dizziness    New Prescriptions New Prescriptions   No medications on file     Merryl Hacker, MD 10/21/17 [phone]

## 2017-10-21 ENCOUNTER — Ambulatory Visit (INDEPENDENT_AMBULATORY_CARE_PROVIDER_SITE_OTHER): Payer: Self-pay | Admitting: Family Medicine

## 2017-10-21 ENCOUNTER — Encounter: Payer: Self-pay | Admitting: Family Medicine

## 2017-10-21 VITALS — BP 127/70 | HR 70 | Temp 97.7°F | Resp 16 | Ht 73.0 in | Wt 236.0 lb

## 2017-10-21 DIAGNOSIS — I10 Essential (primary) hypertension: Secondary | ICD-10-CM

## 2017-10-21 DIAGNOSIS — N529 Male erectile dysfunction, unspecified: Secondary | ICD-10-CM

## 2017-10-21 LAB — URINALYSIS, ROUTINE W REFLEX MICROSCOPIC
BILIRUBIN URINE: NEGATIVE
Glucose, UA: >=500 mg/dL — AB
Ketones, ur: NEGATIVE mg/dL
LEUKOCYTES UA: NEGATIVE
Nitrite: NEGATIVE
PROTEIN: 100 mg/dL — AB
Specific Gravity, Urine: 1.023 (ref 1.005–1.030)
pH: 5 (ref 5.0–8.0)

## 2017-10-21 MED ORDER — AMLODIPINE BESYLATE 10 MG PO TABS: 5.0000 mg | ORAL_TABLET | Freq: Every day | ORAL | 4 refills | Status: DC

## 2017-10-21 NOTE — Discharge Instructions (Signed)
He was seen today for dizziness. This may be related to too aggressive control of your blood pressure.  You can feel dizzy when her blood pressure drops significantly. Follow-up with your primary physician tomorrow for recommendations regarding further adjustment. Your workup appears otherwise reassuring.

## 2017-10-21 NOTE — ED Notes (Signed)
Pt verbalized understanding of d/c instructions and has no further questions. Pt is stable, A&Ox4, VSS.  

## 2017-10-21 NOTE — Progress Notes (Signed)
Patient ID: JERAD DUNLAP, male    DOB: [birth year], 51 y.o.   MRN: [id number]  PCP: Scot Jun, FNP  Chief Complaint  Patient presents with  . Follow-up    Subjective:  HPI CHRSITOPHER WIK is a 51 y.o. male presents for ED follow-up.  Duey presented to the emergency department after becoming dizzy and falling in his bathroom on 10/20/2017.  He reports that 2 hours prior he had taken his newly prescribed increased dose of amlodipine 10 mg and was in the process of taking a hot shower became very dizzy and pale.  He reports he did not lose consciousness.  Spouse called EMS and upon arrival his blood pressures were in the 18H and 63J systolically.  He did not have chest pain, shortness of breath, although reported generalized weakness.  He was evaluated while in the ED and was found to have had an episode of hypotension.  His blood pressure at discharge was 125/76 and he was advised to follow-up with his PCP.  He denies any additional episodes of dizziness however feels that the episode was caused by the increase to his amlodipine.  He does not routinely monitor his blood pressure at home.  [place] also has a complaint of erectile dysfunction (ED) .  He reports previously discussing with another provider.  He is interested in starting ED medication. Complains of difficulty achieving and maintaining an erection. He has not been previously prescribed medication to treat ED in the past. Social History   Social History  . Marital status: Married    Spouse name: N/A  . Number of children: N/A  . Years of education: N/A   Occupational History  . Not on file.   Social History Main Topics  . Smoking status: Never Smoker  . Smokeless tobacco: Current User    Types: Snuff  . Alcohol use No  . Drug use: No  . Sexual activity: Not on file   Other Topics Concern  . Not on file   Social History Narrative  . No narrative on file    Family History  Problem Relation Age of Onset  .  Diabetes Unknown   . Cancer Unknown   . Hypertension Unknown   . Hypertension Mother   . Diabetes Mother   . Cancer Mother   . Hypertension Father    Review of Systems  See HPI  Patient Active Problem List   Diagnosis Date Noted  . Hyperlipidemia 07/09/2016  . Acute CVA (cerebrovascular accident) ([place]) 07/09/2016  . Cerebrovascular accident (CVA) due to occlusion of precerebral artery ([place])   . Stroke-like symptom 07/08/2016  . TIA (transient ischemic attack) 07/08/2016  . Chronic combined systolic (congestive) and diastolic (congestive) heart failure ([place]) 07/08/2016  . Fever 04/02/2015  . Bronchiolitis 04/02/2015  . Type 2 diabetes mellitus with hyperglycemia ([place]) 04/02/2015  . Hypokalemia 04/02/2015  . Febrile illness, acute   . Fatigue 09/28/2012  . Acute exacerbation of CHF (congestive heart failure) ([place]) 09/19/2012  . VARICOSE VEINS, LOWER EXTREMITIES 08/24/2010  . Obesity 06/29/2010  . SYSTOLIC HEART FAILURE, CHRONIC 06/01/2010  . Malignant neoplasm of colon ([place]) 05/30/2010  . HYPERLIPIDEMIA 05/30/2010  . HYPERTENSION, BENIGN ESSENTIAL 05/30/2010  . CARDIOMYOPATHY 05/13/2010  . CHF 05/13/2010  . Type 2 diabetes mellitus ([place]) 12/30/1998    No Known Allergies  Prior to Admission medications   Medication Sig Start Date End Date Taking? Authorizing Provider  amLODipine (NORVASC) 10 MG tablet Take 1 tablet (10 mg total)  by mouth daily. 10/14/17  Yes Scot Jun, FNP  aspirin 81 MG EC tablet Take 1 tablet (81 mg total) by mouth daily. 10/15/16  Yes Micheline Chapman, NP  atorvastatin (LIPITOR) 40 MG tablet TAKE 1 TABLET BY MOUTH DAILY AT 6 PM. 10/14/17  Yes Scot Jun, FNP  BD VEO INSULIN SYR ULTRAFINE 31G X 15/64" 0.5 ML MISC CHECK GLUCOSE FASTING IN AM AND BEFORE MEALS. 09/03/17  Yes Scot Jun, FNP  carvedilol (COREG) 25 MG tablet Take 1 tablet (25 mg total) by mouth 2 (two) times daily with a meal. 10/14/17  Yes Scot Jun, FNP   docusate sodium (COLACE) 100 MG capsule Take 100 mg by mouth daily.   Yes [provider]  furosemide (LASIX) 40 MG tablet Take 1 tablet (40 mg total) by mouth daily. 01/13/17  Yes Micheline Chapman, NP  gemfibrozil (LOPID) 600 MG tablet Take 1 tablet (600 mg total) by mouth 2 (two) times daily before a meal. 10/15/17  Yes Scot Jun, FNP  Insulin Glargine (LANTUS SOLOSTAR) 100 UNIT/ML Solostar Pen Inject 25 Units into the skin daily at 10 pm. 07/14/17  Yes Scot Jun, FNP  insulin NPH-regular Human (NOVOLIN 70/30) (70-30) 100 UNIT/ML injection INJECT 10 UNITS INTO THE SKIN 2 TIMES DAILY WITH A MEAL. 10/14/17  Yes Scot Jun, FNP  Insulin Pen Needle (ULTICARE MINI PEN NEEDLES) 31G X 6 MM MISC Use as directed 10/14/17  Yes Scot Jun, FNP  Insulin Syringe-Needle U-100 (INSULIN SYRINGE .5CC/31GX5/16") 31G X 5/16" 0.5 ML MISC Use as per directions. 10/15/16  Yes Micheline Chapman, NP  lisinopril (PRINIVIL,ZESTRIL) 20 MG tablet Take 1 tablet (20 mg total) by mouth 2 (two) times daily. 10/14/17  Yes Scot Jun, FNP  metFORMIN (GLUCOPHAGE) 1000 MG tablet Take 1 tablet (1,000 mg total) by mouth 2 (two) times daily with a meal. 10/14/17  Yes Scot Jun, FNP  spironolactone (ALDACTONE) 25 MG tablet Take 1 tablet (25 mg total) by mouth daily. 10/14/17  Yes Scot Jun, FNP    Past Medical, Surgical Family and Social History reviewed and updated.    Objective:   Today's Vitals   10/21/17 1314  BP: 127/70  Pulse: 70  Resp: 16  Temp: 97.7 F (36.5 C)  TempSrc: Oral  SpO2: 98%  Weight: 236 lb (107 kg)  Height: 6\' 1"  (1.854 m)    Wt Readings from Last 3 Encounters:  10/21/17 236 lb (107 kg)  10/20/17 226 lb (102.5 kg)  10/14/17 236 lb (107 kg)    Physical Exam  Constitutional: He is oriented to person, place, and time. He appears well-developed and well-nourished.  Eyes: Pupils are equal, round, and reactive to light.  Conjunctivae and EOM are normal.  Cardiovascular: Normal rate, regular rhythm, normal heart sounds and intact distal pulses.   Pulmonary/Chest: Effort normal and breath sounds normal.  Musculoskeletal: Normal range of motion.  Neurological: He is alert and oriented to person, place, and time. Coordination normal.  Skin: Skin is warm and dry.  Psychiatric: He has a normal mood and affect. His behavior is normal. Judgment and thought content normal.      Assessment & Plan:  1. Essential hypertension, stable today. Reduce amlodipine 5 mg once daily. Continue lisinopril 20 mg twice daily. Occasionally monitor BP at any retail store and log readings. If BP is greater than 150/90, notify me here at the office. 2. Erectile dysfunction, unspecified erectile dysfunction type.  Patient's medical history significant for uncontrolled hypertension, CVA, grade 1 diastolic heart failure, and uncontrolled type 2 diabetes.  Explained that I would not feel comfortable prescribing any medications for erectile dysfunction  without a cardiology sign off due to patient's complex medical history.  Patient verbalized understanding.   RTC:  3 months chronic disease management and 4 weeks for BP check.   Carroll Sage. Kenton Kingfisher, MSN, FNP-C [facility]  [address] Barbara [place], [place] [address] [phone]

## 2017-10-21 NOTE — Patient Instructions (Signed)
Changing amlodipine 5 mg once daily. If you experience anymore dizziness , please let me know.    Tadalafil tablets (Cialis) What is this medicine? TADALAFIL (tah DA la fil) is used to treat erection problems in men. It is also used for enlargement of the prostate gland in men, a condition called benign prostatic hyperplasia or BPH. This medicine improves urine flow and reduces BPH symptoms. This medicine can also treat both erection problems and BPH when they occur together. This medicine may be used for other purposes; ask your health care provider or pharmacist if you have questions. COMMON BRAND NAME(S): Cialis What should I tell my health care provider before I take this medicine? They need to know if you have any of these conditions: -bleeding disorders -eye or vision problems, including a rare inherited eye disease called retinitis pigmentosa -anatomical deformation of the penis, Peyronie's disease, or history of priapism (painful and prolonged erection) -heart disease, angina, a history of heart attack, irregular heart beats, or other heart problems -high or low blood pressure -history of blood diseases, like sickle cell anemia or leukemia -history of stomach bleeding -kidney disease -liver disease -stroke -an unusual or allergic reaction to tadalafil, other medicines, foods, dyes, or preservatives -pregnant or trying to get pregnant -breast-feeding How should I use this medicine? Take this medicine by mouth with a glass of water. Follow the directions on the prescription label. You may take this medicine with or without meals. When this medicine is used for erection problems, your doctor may prescribe it to be taken once daily or as needed. If you are taking the medicine as needed, you may be able to have sexual activity 30 minutes after taking it and for up to 36 hours after taking it. Whether you are taking the medicine as needed or once daily, you should not take more than one dose  per day. If you are taking this medicine for symptoms of benign prostatic hyperplasia (BPH) or to treat both BPH and an erection problem, take the dose once daily at about the same time each day. Do not take your medicine more often than directed. Talk to your pediatrician regarding the use of this medicine in children. Special care may be needed. Overdosage: If you think you have taken too much of this medicine contact a poison control center or emergency room at once. NOTE: This medicine is only for you. Do not share this medicine with others. What if I miss a dose? If you are taking this medicine as needed for erection problems, this does not apply. If you miss a dose while taking this medicine once daily for an erection problem, benign prostatic hyperplasia, or both, take it as soon as you remember, but do not take more than one dose per day. What may interact with this medicine? Do not take this medicine with any of the following medications: -nitrates like amyl nitrite, isosorbide dinitrate, isosorbide mononitrate, nitroglycerin -other medicines for erectile dysfunction like avanafil, sildenafil, vardenafil -other tadalafil products (Adcirca) -riociguat This medicine may also interact with the following medications: -certain drugs for high blood pressure -certain drugs for the treatment of HIV infection or AIDS -certain drugs used for fungal or yeast infections, like fluconazole, itraconazole, ketoconazole, and voriconazole -certain drugs used for seizures like carbamazepine, phenytoin, and phenobarbital -grapefruit juice -macrolide antibiotics like clarithromycin, erythromycin, troleandomycin -medicines for prostate problems -rifabutin, rifampin or rifapentine This list may not describe all possible interactions. Give your health care provider a list of all the  medicines, herbs, non-prescription drugs, or dietary supplements you use. Also tell them if you smoke, drink alcohol, or use  illegal drugs. Some items may interact with your medicine. What should I watch for while using this medicine? If you notice any changes in your vision while taking this drug, call your doctor or health care professional as soon as possible. Stop using this medicine and call your health care provider right away if you have a loss of sight in one or both eyes. Contact your doctor or health care professional right away if the erection lasts longer than 4 hours or if it becomes painful. This may be a sign of serious problem and must be treated right away to prevent permanent damage. If you experience symptoms of nausea, dizziness, chest pain or arm pain upon initiation of sexual activity after taking this medicine, you should refrain from further activity and call your doctor or health care professional as soon as possible. Do not drink alcohol to excess (examples, 5 glasses of wine or 5 shots of whiskey) when taking this medicine. When taken in excess, alcohol can increase your chances of getting a headache or getting dizzy, increasing your heart rate or lowering your blood pressure. Using this medicine does not protect you or your partner against HIV infection (the virus that causes AIDS) or other sexually transmitted diseases. What side effects may I notice from receiving this medicine? Side effects that you should report to your doctor or health care professional as soon as possible: -allergic reactions like skin rash, itching or hives, swelling of the face, lips, or tongue -breathing problems -changes in hearing -changes in vision -chest pain -fast, irregular heartbeat -prolonged or painful erection -seizures Side effects that usually do not require medical attention (report to your doctor or health care professional if they continue or are bothersome): -back pain -dizziness -flushing -headache -indigestion -muscle aches -nausea -stuffy or runny nose This list may not describe all possible  side effects. Call your doctor for medical advice about side effects. You may report side effects to FDA at 1-800-FDA-1088. Where should I keep my medicine? Keep out of the reach of children. Store at room temperature between 15 and 30 degrees C (59 and 86 degrees F). Throw away any unused medicine after the expiration date. NOTE: This sheet is a summary. It may not cover all possible information. If you have questions about this medicine, talk to your doctor, pharmacist, or health care provider.  2018 Elsevier/Gold Standard (2014-05-06 13:15:49) Sildenafil tablets (Viagra) What is this medicine? SILDENAFIL (sil DEN a fil) is used to treat erection problems in men. This medicine may be used for other purposes; ask your health care provider or pharmacist if you have questions. COMMON BRAND NAME(S): Viagra What should I tell my health care provider before I take this medicine? They need to know if you have any of these conditions: -bleeding disorders -eye or vision problems, including a rare inherited eye disease called retinitis pigmentosa -anatomical deformation of the penis, Peyronie's disease, or history of priapism (painful and prolonged erection) -heart disease, angina, a history of heart attack, irregular heart beats, or other heart problems -high or low blood pressure -history of blood diseases, like sickle cell anemia or leukemia -history of stomach bleeding -kidney disease -liver disease -stroke -an unusual or allergic reaction to sildenafil, other medicines, foods, dyes, or preservatives -pregnant or trying to get pregnant -breast-feeding How should I use this medicine? Take this medicine by mouth with a glass of water.  Follow the directions on the prescription label. The dose is usually taken 1 hour before sexual activity. You should not take the dose more than once per day. Do not take your medicine more often than directed. Talk to your pediatrician regarding the use of this  medicine in children. This medicine is not used in children for this condition. Overdosage: If you think you have taken too much of this medicine contact a poison control center or emergency room at once. NOTE: This medicine is only for you. Do not share this medicine with others. What if I miss a dose? This does not apply. Do not take double or extra doses. What may interact with this medicine? Do not take this medicine with any of the following medications: -cisapride -nitrates like amyl nitrite, isosorbide dinitrate, isosorbide mononitrate, nitroglycerin -riociguat This medicine may also interact with the following medications: -antiviral medicines for HIV or AIDS -bosentan -certain medicines for benign prostatic hyperplasia (BPH) -certain medicines for blood pressure -certain medicines for fungal infections like ketoconazole and itraconazole -cimetidine -erythromycin -rifampin This list may not describe all possible interactions. Give your health care provider a list of all the medicines, herbs, non-prescription drugs, or dietary supplements you use. Also tell them if you smoke, drink alcohol, or use illegal drugs. Some items may interact with your medicine. What should I watch for while using this medicine? If you notice any changes in your vision while taking this drug, call your doctor or health care professional as soon as possible. Stop using this medicine and call your health care provider right away if you have a loss of sight in one or both eyes. Contact your doctor or health care professional right away if you have an erection that lasts longer than 4 hours or if it becomes painful. This may be a sign of a serious problem and must be treated right away to prevent permanent damage. If you experience symptoms of nausea, dizziness, chest pain or arm pain upon initiation of sexual activity after taking this medicine, you should refrain from further activity and call your doctor or  health care professional as soon as possible. Do not drink alcohol to excess (examples, 5 glasses of wine or 5 shots of whiskey) when taking this medicine. When taken in excess, alcohol can increase your chances of getting a headache or getting dizzy, increasing your heart rate or lowering your blood pressure. Using this medicine does not protect you or your partner against HIV infection (the virus that causes AIDS) or other sexually transmitted diseases. What side effects may I notice from receiving this medicine? Side effects that you should report to your doctor or health care professional as soon as possible: -allergic reactions like skin rash, itching or hives, swelling of the face, lips, or tongue -breathing problems -changes in hearing -changes in vision -chest pain -fast, irregular heartbeat -prolonged or painful erection -seizures Side effects that usually do not require medical attention (report to your doctor or health care professional if they continue or are bothersome): -back pain -dizziness -flushing -headache -indigestion -muscle aches -nausea -stuffy or runny nose This list may not describe all possible side effects. Call your doctor for medical advice about side effects. You may report side effects to FDA at 1-800-FDA-1088. Where should I keep my medicine? Keep out of reach of children. Store at room temperature between 15 and 30 degrees C (59 and 86 degrees F). Throw away any unused medicine after the expiration date. NOTE: This sheet is a summary.  It may not cover all possible information. If you have questions about this medicine, talk to your doctor, pharmacist, or health care provider.  2018 Elsevier/Gold Standard (2015-11-29 12:00:25)

## 2017-10-21 NOTE — ED Notes (Signed)
Pt able to walk w/ no issues. Pt is not dizzy at this time

## 2017-11-11 ENCOUNTER — Ambulatory Visit: Payer: Self-pay | Admitting: Family Medicine

## 2017-11-11 VITALS — BP 136/84

## 2017-11-11 DIAGNOSIS — Z0130 Encounter for examination of blood pressure without abnormal findings: Secondary | ICD-10-CM

## 2017-12-02 ENCOUNTER — Other Ambulatory Visit: Payer: Self-pay | Admitting: Family Medicine

## 2017-12-02 DIAGNOSIS — I509 Heart failure, unspecified: Secondary | ICD-10-CM

## 2017-12-02 MED FILL — ?HUMULIN 70/30 VIAL: (70-30) 100 | 28 days supply | Qty: 10 | Fill #1

## 2017-12-02 MED FILL — AMLODIPINE BESYLATE 10 MG T: 10 | 30 days supply | Qty: 30 | Fill #1

## 2017-12-02 MED FILL — ?METFORMIN HCL 1,000 MG TAB: 1000 | 30 days supply | Qty: 60 | Fill #1

## 2017-12-02 MED FILL — ?CARVEDILOL 25 MG TABLET: 25 | 30 days supply | Qty: 60 | Fill #1

## 2017-12-02 MED FILL — TRUEPLUS PEN NDL 31G X 1/4: 31G X 6 MM | 30 days supply | Qty: 100 | Fill #1

## 2017-12-02 MED FILL — LISINOPRIL 20 MG TAB: 20 | 30 days supply | Qty: 60 | Fill #1

## 2017-12-02 MED FILL — ?SPIRONOLACTONE 25 MG TABLE: 25 | 30 days supply | Qty: 30 | Fill #1

## 2017-12-02 MED FILL — ?FUROSEMIDE 40 MG TABLET: 40 | 30 days supply | Qty: 30 | Fill #0

## 2017-12-02 MED FILL — BD INSUL SYR 0.5 ML 31GX15/: 31G X 15/64 | 30 days supply | Qty: 100 | Fill #0

## 2017-12-02 MED FILL — ?ATORVASTATIN 40MG TABLET: 40 | 30 days supply | Qty: 30 | Fill #1

## 2017-12-02 MED FILL — TRUEPLUS PEN NDL 31G X 1/4": 31G X 6 MM | 30 days supply | Qty: 100 | Fill #1

## 2017-12-02 MED FILL — GEMFIBROZIL 600 MG TABLET: 600 | 30 days supply | Qty: 60 | Fill #1

## 2017-12-03 ENCOUNTER — Other Ambulatory Visit: Payer: Self-pay

## 2017-12-03 DIAGNOSIS — I5042 Chronic combined systolic (congestive) and diastolic (congestive) heart failure: Secondary | ICD-10-CM

## 2017-12-03 DIAGNOSIS — I10 Essential (primary) hypertension: Secondary | ICD-10-CM

## 2017-12-03 MED ORDER — SPIRONOLACTONE 25 MG PO TABS: 25.0000 mg | ORAL_TABLET | Freq: Every day | ORAL | 6 refills | Status: DC

## 2017-12-03 MED ORDER — METFORMIN HCL 1000 MG PO TABS: 1000.0000 mg | ORAL_TABLET | Freq: Two times a day (BID) | ORAL | 5 refills | Status: DC

## 2017-12-03 MED ORDER — GEMFIBROZIL 600 MG PO TABS: 600.0000 mg | ORAL_TABLET | Freq: Two times a day (BID) | ORAL | 2 refills | Status: DC

## 2017-12-03 MED ORDER — ASPIRIN 81 MG PO TBEC: 81.0000 mg | DELAYED_RELEASE_TABLET | Freq: Every day | ORAL | 6 refills | Status: DC

## 2017-12-03 MED ORDER — INSULIN PEN NEEDLE 31G X 6 MM MISC: 12 refills | Status: AC

## 2017-12-03 MED ORDER — DOCUSATE SODIUM 100 MG PO CAPS: 100.0000 mg | ORAL_CAPSULE | Freq: Every day | ORAL | 1 refills | Status: DC

## 2017-12-03 MED ORDER — INSULIN NPH ISOPHANE & REGULAR (70-30) 100 UNIT/ML ~~LOC~~ SUSP: SUBCUTANEOUS | 5 refills | Status: DC

## 2017-12-03 MED ORDER — LISINOPRIL 20 MG PO TABS: 20.0000 mg | ORAL_TABLET | Freq: Two times a day (BID) | ORAL | 6 refills | Status: DC

## 2017-12-03 MED ORDER — AMLODIPINE BESYLATE 10 MG PO TABS: 5.0000 mg | ORAL_TABLET | Freq: Every day | ORAL | 4 refills | Status: DC

## 2017-12-03 MED ORDER — ATORVASTATIN CALCIUM 40 MG PO TABS: ORAL_TABLET | ORAL | 2 refills | Status: DC

## 2017-12-03 MED ORDER — INSULIN GLARGINE 100 UNIT/ML SOLOSTAR PEN: 25.0000 [IU] | PEN_INJECTOR | Freq: Every day | SUBCUTANEOUS | 3 refills | Status: DC

## 2017-12-03 MED ORDER — CARVEDILOL 25 MG PO TABS: 25.0000 mg | ORAL_TABLET | Freq: Two times a day (BID) | ORAL | 6 refills | Status: DC

## 2017-12-03 MED FILL — ?CARVEDILOL 25 MG TABLET: 25 | 30 days supply | Qty: 60 | Fill #3

## 2017-12-03 MED FILL — ATORVASTATIN 40 MG TABLET: 40 | 30 days supply | Qty: 30 | Fill #0

## 2017-12-03 MED FILL — !NOVOLIN 70/30 100 UNITS/ML: (70-30) 100 | 28 days supply | Qty: 10 | Fill #0

## 2017-12-03 MED FILL — $LANTUS SOLOSTAR 100 UNITS/: 100 | 36 days supply | Qty: 9 | Fill #0

## 2017-12-23 ENCOUNTER — Emergency Department (HOSPITAL_COMMUNITY)
Admission: EM | Admit: 2017-12-23 | Discharge: 2017-12-23 | Disposition: A | Discharge status: Home / Self Care | Payer: Self-pay | Attending: Emergency Medicine | Admitting: Emergency Medicine

## 2017-12-23 ENCOUNTER — Other Ambulatory Visit: Payer: Self-pay

## 2017-12-23 ENCOUNTER — Encounter (HOSPITAL_COMMUNITY): Payer: Self-pay | Admitting: Emergency Medicine

## 2017-12-23 DIAGNOSIS — I509 Heart failure, unspecified: Secondary | ICD-10-CM | POA: Insufficient documentation

## 2017-12-23 DIAGNOSIS — Z7982 Long term (current) use of aspirin: Secondary | ICD-10-CM | POA: Insufficient documentation

## 2017-12-23 DIAGNOSIS — Z794 Long term (current) use of insulin: Secondary | ICD-10-CM | POA: Insufficient documentation

## 2017-12-23 DIAGNOSIS — I110 Hypertensive heart disease with heart failure: Secondary | ICD-10-CM | POA: Insufficient documentation

## 2017-12-23 DIAGNOSIS — M545 Low back pain, unspecified: Secondary | ICD-10-CM

## 2017-12-23 DIAGNOSIS — Z79899 Other long term (current) drug therapy: Secondary | ICD-10-CM | POA: Insufficient documentation

## 2017-12-23 DIAGNOSIS — E119 Type 2 diabetes mellitus without complications: Secondary | ICD-10-CM | POA: Insufficient documentation

## 2017-12-23 MED ORDER — CYCLOBENZAPRINE HCL 10 MG PO TABS: 10.0000 mg | ORAL_TABLET | Freq: Every evening | ORAL | 0 refills | Status: DC | PRN

## 2017-12-23 NOTE — Discharge Instructions (Signed)
Please read instructions below.  You can take tylenol every 4 hours as needed for pain. Apply to your back for 20 minutes at a time.  You can also apply heat.   You can take Flexeril at bedtime as needed for muscle spasm. Talk with your PCP about any new medications and follow-up on your visit today. Return to ER if new numbness or tingling in your arms or legs, inability to urinate, inability to hold your bowels, or weakness in your extremities.
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
## 2018-01-14 ENCOUNTER — Ambulatory Visit: Payer: Self-pay | Admitting: Family Medicine

## 2018-01-23 MED FILL — ?ATORVASTATIN 40MG TABLET: 40 | 30 days supply | Qty: 30 | Fill #2

## 2018-01-23 MED FILL — SPIRONOLACTONE 25 MG TABS: 25 | 30 days supply | Qty: 30 | Fill #2

## 2018-01-23 MED FILL — ?CARVEDILOL 25 MG TABLET: 25 | 30 days supply | Qty: 60 | Fill #2

## 2018-01-23 MED FILL — GEMFIBROZIL 600 MG TAB: 600 | 30 days supply | Qty: 60 | Fill #2

## 2018-01-23 MED FILL — LISINOPRIL 20 MG TAB: 20 | 30 days supply | Qty: 60 | Fill #2

## 2018-01-23 MED FILL — ?METFORMIN HCL 1,000 MG TAB: 1000 | 30 days supply | Qty: 60 | Fill #2

## 2018-01-23 MED FILL — AMLODIPINE BESYLATE 10 MG T: 10 | 30 days supply | Qty: 30 | Fill #2

## 2018-01-23 MED FILL — ?HUMULIN 70/30 VIAL: (70-30) 100 | 28 days supply | Qty: 10 | Fill #2

## 2018-01-23 MED FILL — $LANTUS SOLOSTAR 100 UNITS/: 100 | 36 days supply | Qty: 9 | Fill #1

## 2018-01-23 MED FILL — ?FUROSEMIDE 40 MG TABLET: 40 | 30 days supply | Qty: 30 | Fill #1

## 2018-02-07 ENCOUNTER — Other Ambulatory Visit: Payer: Self-pay

## 2018-02-07 ENCOUNTER — Emergency Department: Payer: Self-pay

## 2018-02-07 ENCOUNTER — Emergency Department
Admission: EM | Admit: 2018-02-07 | Discharge: 2018-02-07 | Disposition: A | Discharge status: Home / Self Care | Payer: Self-pay | Attending: Emergency Medicine | Admitting: Emergency Medicine

## 2018-02-07 DIAGNOSIS — E119 Type 2 diabetes mellitus without complications: Secondary | ICD-10-CM | POA: Insufficient documentation

## 2018-02-07 DIAGNOSIS — J189 Pneumonia, unspecified organism: Secondary | ICD-10-CM

## 2018-02-07 DIAGNOSIS — I5042 Chronic combined systolic (congestive) and diastolic (congestive) heart failure: Secondary | ICD-10-CM | POA: Insufficient documentation

## 2018-02-07 DIAGNOSIS — Z79899 Other long term (current) drug therapy: Secondary | ICD-10-CM | POA: Insufficient documentation

## 2018-02-07 DIAGNOSIS — Z7982 Long term (current) use of aspirin: Secondary | ICD-10-CM | POA: Insufficient documentation

## 2018-02-07 DIAGNOSIS — R0789 Other chest pain: Secondary | ICD-10-CM | POA: Insufficient documentation

## 2018-02-07 DIAGNOSIS — I110 Hypertensive heart disease with heart failure: Secondary | ICD-10-CM | POA: Insufficient documentation

## 2018-02-07 DIAGNOSIS — Z794 Long term (current) use of insulin: Secondary | ICD-10-CM | POA: Insufficient documentation

## 2018-02-07 DIAGNOSIS — J181 Lobar pneumonia, unspecified organism: Secondary | ICD-10-CM | POA: Insufficient documentation

## 2018-02-07 LAB — BASIC METABOLIC PANEL
ANION GAP: 12 (ref 5–15)
BUN: 15 mg/dL (ref 6–20)
CO2: 23 mmol/L (ref 22–32)
Calcium: 9 mg/dL (ref 8.9–10.3)
Chloride: 101 mmol/L (ref 101–111)
Creatinine, Ser: 1.03 mg/dL (ref 0.61–1.24)
GFR calc Af Amer: >60 mL/min (ref >60)
GFR calc non Af Amer: >60 mL/min (ref >60)
Glucose, Bld: 234 mg/dL — ABNORMAL HIGH (ref 65–99)
POTASSIUM: 3.8 mmol/L (ref 3.5–5.1)
SODIUM: 136 mmol/L (ref 135–145)

## 2018-02-07 LAB — CBC
HEMATOCRIT: 34.7 % — AB (ref 40.0–52.0)
Hemoglobin: 12 g/dL — ABNORMAL LOW (ref 13.0–18.0)
MCH: 29.5 pg (ref 26.0–34.0)
MCHC: 34.5 g/dL (ref 32.0–36.0)
MCV: 85.4 fL (ref 80.0–100.0)
PLATELETS: 271 10*3/uL (ref 150–440)
RBC: 4.07 MIL/uL — ABNORMAL LOW (ref 4.40–5.90)
RDW: 13.3 % (ref 11.5–14.5)
WBC: 6.1 10*3/uL (ref 3.8–10.6)

## 2018-02-07 LAB — TROPONIN I
Troponin I: <0.03 ng/mL (ref <0.03)
Troponin I: <0.03 ng/mL (ref <0.03)

## 2018-02-07 MED ORDER — AZITHROMYCIN 250 MG PO TABS: 250.0000 mg | ORAL_TABLET | Freq: Every day | ORAL | 0 refills | Status: AC

## 2018-02-07 MED ORDER — IBUPROFEN 600 MG PO TABS
600.0000 mg | ORAL_TABLET | Freq: Once | ORAL | Status: AC
  Administered 2018-02-07: 600 mg via ORAL
  Filled 2018-02-07: qty 1

## 2018-02-07 MED ORDER — AZITHROMYCIN 500 MG PO TABS
500.0000 mg | ORAL_TABLET | Freq: Once | ORAL | Status: AC
  Administered 2018-02-07: 500 mg via ORAL
  Filled 2018-02-07: qty 1

## 2018-02-07 MED ORDER — GUAIFENESIN-CODEINE 100-10 MG/5ML PO SOLN: 10.0000 mL | ORAL | 0 refills | Status: DC | PRN

## 2018-02-07 NOTE — ED Provider Notes (Addendum)
[facility] Emergency Department Provider Note ____________________________________________   First MD Initiated Contact with Patient 02/07/18 2221     (approximate)  I have reviewed the triage vital signs and the nursing notes.   HISTORY  Chief Complaint Chest Pain    HPI FEDERICK Hart is a 52 y.o. male with past medical history as noted below including hypertension, CHF, DM, and stroke, who presents with chest pain for 1 day, mainly substernal but somewhat in the left shoulder, gradual onset, nonexertional, and now somewhat improved.  Patient also reports nasal congestion over the last several days, sore throat, as well as nonproductive cough and some mild shortness of breath.  He denies fevers, but his wife reports that he was somewhat diaphoretic today.  No sick contacts or other recent illness.   Past Medical History:  Diagnosis Date  . CHF (congestive heart failure) ([place])   . Colon cancer ([place])   . Diabetes mellitus   . HTN (hypertension)   . Hypercholesteremia   . Stroke [facility])     Patient Active Problem List   Diagnosis Date Noted  . Hyperlipidemia 07/09/2016  . Acute CVA (cerebrovascular accident) ([place]) 07/09/2016  . Cerebrovascular accident (CVA) due to occlusion of precerebral artery ([place])   . Stroke-like symptom 07/08/2016  . TIA (transient ischemic attack) 07/08/2016  . Chronic combined systolic (congestive) and diastolic (congestive) heart failure ([place]) 07/08/2016  . Fever 04/02/2015  . Bronchiolitis 04/02/2015  . Type 2 diabetes mellitus with hyperglycemia ([place]) 04/02/2015  . Hypokalemia 04/02/2015  . Febrile illness, acute   . Fatigue 09/28/2012  . Acute exacerbation of CHF (congestive heart failure) ([place]) 09/19/2012  . VARICOSE VEINS, LOWER EXTREMITIES 08/24/2010  . Obesity 06/29/2010  . SYSTOLIC HEART FAILURE, CHRONIC 06/01/2010  . Malignant neoplasm of colon ([place]) 05/30/2010  . HYPERLIPIDEMIA 05/30/2010  . HYPERTENSION,  BENIGN ESSENTIAL 05/30/2010  . CARDIOMYOPATHY 05/13/2010  . CHF 05/13/2010  . Type 2 diabetes mellitus ([place]) 12/30/1998    Past Surgical History:  Procedure Laterality Date  . APPENDECTOMY    . CHOLECYSTECTOMY    . COLON SURGERY      Prior to Admission medications   Medication Sig Start Date End Date Taking? Authorizing Provider  amLODipine (NORVASC) 10 MG tablet Take 0.5 tablets (5 mg total) by mouth daily. 12/03/17   Scot Jun, FNP  aspirin 81 MG EC tablet Take 1 tablet (81 mg total) by mouth daily. 12/03/17   Scot Jun, FNP  atorvastatin (LIPITOR) 40 MG tablet TAKE 1 TABLET BY MOUTH DAILY AT 6 PM. 12/03/17   Scot Jun, FNP  azithromycin (ZITHROMAX) 250 MG tablet Take 1 tablet (250 mg total) by mouth daily for 4 days. 02/08/18 02/12/18  Arta Silence, MD  BD VEO INSULIN SYR ULTRAFINE 31G X 15/64" 0.5 ML MISC USE AS DIRECTED. 12/02/17   Scot Jun, FNP  carvedilol (COREG) 25 MG tablet Take 1 tablet (25 mg total) by mouth 2 (two) times daily with a meal. 12/03/17   Scot Jun, FNP  cyclobenzaprine (FLEXERIL) 10 MG tablet Take 1 tablet (10 mg total) by mouth at bedtime as needed for muscle spasms. 12/23/17   Robinson, [place] N, PA-C  docusate sodium (COLACE) 100 MG capsule Take 1 capsule (100 mg total) by mouth daily. 12/03/17   Scot Jun, FNP  furosemide (LASIX) 40 MG tablet TAKE 1 TABLET BY MOUTH DAILY. 12/02/17   Scot Jun, FNP  gemfibrozil (LOPID) 600 MG tablet Take 1  tablet (600 mg total) by mouth 2 (two) times daily before a meal. 12/03/17   Scot Jun, FNP  guaiFENesin-codeine 100-10 MG/5ML syrup Take 10 mLs by mouth every 4 (four) hours as needed for cough. 02/07/18   Arta Silence, MD  Insulin Glargine (LANTUS SOLOSTAR) 100 UNIT/ML Solostar Pen Inject 25 Units into the skin daily at 10 pm. 12/03/17   Scot Jun, FNP  insulin NPH-regular Human (NOVOLIN 70/30) (70-30) 100 UNIT/ML injection INJECT 10 UNITS  INTO THE SKIN 2 TIMES DAILY WITH A MEAL. 12/03/17   Scot Jun, FNP  Insulin Pen Needle Flossie Buffy MINI PEN NEEDLES) 31G X 6 MM MISC Use as directed 12/03/17   Scot Jun, FNP  Insulin Syringe-Needle U-100 (INSULIN SYRINGE .5CC/31GX5/16") 31G X 5/16" 0.5 ML MISC Use as per directions. 10/15/16   Micheline Chapman, NP  lisinopril (PRINIVIL,ZESTRIL) 20 MG tablet Take 1 tablet (20 mg total) by mouth 2 (two) times daily. 12/03/17   Scot Jun, FNP  metFORMIN (GLUCOPHAGE) 1000 MG tablet Take 1 tablet (1,000 mg total) by mouth 2 (two) times daily with a meal. 12/03/17   Scot Jun, FNP  spironolactone (ALDACTONE) 25 MG tablet Take 1 tablet (25 mg total) by mouth daily. 12/03/17   Scot Jun, FNP    Allergies Patient has no known allergies.  Family History  Problem Relation Age of Onset  . Diabetes Unknown   . Cancer Unknown   . Hypertension Unknown   . Hypertension Mother   . Diabetes Mother   . Cancer Mother   . Hypertension Father     Social History Social History   Tobacco Use  . Smoking status: Never Smoker  . Smokeless tobacco: Current User    Types: Snuff  Substance Use Topics  . Alcohol use: No  . Drug use: No    Review of Systems  Constitutional: No fever.  Positive for chills and sweats. Eyes: No visual changes. ENT: Positive for sore throat. Cardiovascular: Positive for chest pain. Respiratory: Positive for mild shortness of breath. Gastrointestinal: No nausea, no vomiting.  No diarrhea.  Genitourinary: Negative for dysuria.  Musculoskeletal: Negative for back pain. Skin: Negative for rash. Neurological: Negative for headache.   ____________________________________________   PHYSICAL EXAM:  VITAL SIGNS: ED Triage Vitals  Enc Vitals Group     BP 02/07/18 1631 130/63     Pulse Rate 02/07/18 1631 96     Resp 02/07/18 1631 18     Temp 02/07/18 1631 (!) 97.3 F (36.3 C)     Temp Source 02/07/18 1631 Oral     SpO2 02/07/18  1631 97 %     Weight 02/07/18 1635 236 lb (107 kg)     Height 02/07/18 1635 6\' 1"  (1.854 m)     Head Circumference --      Peak Flow --      Pain Score 02/07/18 1634 8     Pain Loc --      Pain Edu? --      Excl. in [place]? --     Constitutional: Alert and oriented. Well appearing and in no acute distress. Eyes: Conjunctivae are normal.  Head: Atraumatic. Nose: No congestion/rhinnorhea. Mouth/Throat: Mucous membranes are moist.  Oropharynx clear with no exudates. Neck: Normal range of motion.  Cardiovascular: Normal rate, regular rhythm. Grossly normal heart sounds.  Good peripheral circulation. Respiratory: Normal respiratory effort.  No retractions. Lungs CTAB. Gastrointestinal: No distention.  Musculoskeletal: No lower extremity edema.  Extremities  warm and well perfused.  No calf or popliteal swelling or tenderness. Neurologic:  Normal speech and language. No gross focal neurologic deficits are appreciated.  Skin:  Skin is warm and dry. No rash noted. Psychiatric: Mood and affect are normal. Speech and behavior are normal.  ____________________________________________   LABS (all labs ordered are listed, but only abnormal results are displayed)  Labs Reviewed  BASIC METABOLIC PANEL - Abnormal; Notable for the following components:      Result Value   Glucose, Bld 234 (*)    All other components within normal limits  CBC - Abnormal; Notable for the following components:   RBC 4.07 (*)    Hemoglobin 12.0 (*)    HCT 34.7 (*)    All other components within normal limits  TROPONIN I  TROPONIN I   ____________________________________________  EKG  ED ECG REPORT I, Arta Silence, the attending physician, personally viewed and interpreted this ECG.  Date: 02/07/2018 EKG Time: 1630 Rate: 96 Rhythm: normal sinus rhythm QRS Axis: normal Intervals: normal ST/T Wave abnormalities: Nonspecific lateral T wave abnormalities Narrative Interpretation: no evidence of acute  ischemia; no significant change when compared to EKG dated 10/20/2017  ____________________________________________  RADIOLOGY  CXR: Left lung base opacity, concerning for possible pneumonia  ____________________________________________   PROCEDURES  Procedure(s) performed: No  Procedures  Critical Care performed: No ____________________________________________   INITIAL IMPRESSION / ASSESSMENT AND PLAN / ED COURSE  Pertinent labs & imaging results that were available during my care of the patient were reviewed by me and considered in my medical decision making (see chart for details).  52 year old male with past medical history as noted above presents with atypical chest pain today, as well as with nasal congestion, sore throat, cough and mild shortness of breath over the last several days.  Patient's wife noted that he was diaphoretic today.  I reviewed the past medical records in Epic; no recent ED visits or admissions for similar presentation.  On exam, the patient is very well-appearing, the vital signs are normal, and the remainder the exam is unremarkable.  Patient's EKG shows no ischemic changes.  Chest x-ray reveals left lower lobe infiltrate, and given the patient's cough and other associated symptoms, that this is most consistent with pneumonia clinically.  First troponin is negative, and the remainder the lab workup is unremarkable.  No clinical evidence to suggest DVT or PE.  No evidence of acute CHF exacerbation.  Plan: Repeat  Given the patient's normal vital signs, reassuring lab limited infiltrate on x-ray, there is no indication for admission.  Plan: repeat troponin given patient's increased cardiac risk, and if negative plan for discharge home with azithromycin and cough medication.    ----------------------------------------- 12:28 AM on 02/08/2018 -----------------------------------------  Repeat troponin is negative.  Patient feels well to go home.  Return  precautions given, the patient expresses understanding.    ____________________________________________   FINAL CLINICAL IMPRESSION(S) / ED DIAGNOSES  Final diagnoses:  Community acquired pneumonia of left lower lobe of lung ([place])  Atypical chest pain      NEW MEDICATIONS STARTED DURING THIS VISIT:  Discharge Medication List as of 02/07/2018 11:48 PM    START taking these medications   Details  azithromycin (ZITHROMAX) 250 MG tablet Take 1 tablet (250 mg total) by mouth daily for 4 days., Starting Sun 02/08/2018, Until Thu 02/12/2018, Print    guaiFENesin-codeine 100-10 MG/5ML syrup Take 10 mLs by mouth every 4 (four) hours as needed for cough., Starting Sat 02/07/2018, Print  Note:  This document was prepared using Dragon voice recognition software and may include unintentional dictation errors.     Arta Silence, MD 02/08/18 Jacqulyn Cane, MD 02/08/18 [phone]

## 2018-02-07 NOTE — Discharge Instructions (Signed)
Take the antibiotic starting tomorrow, and finish the full course.  Return to the emergency department for new, worsening, or persistent chest pain, difficulty breathing, fevers, weakness, or any other new or worsening symptoms that concern you.  You should follow-up with your primary care doctor within 1-2 weeks.

## 2018-02-07 NOTE — ED Triage Notes (Signed)
Pt states he began feeling bad yesterday with headache and some nasal congestion then chest pain started today, some moving up into left shoulder, wife states he felt sweaty at times

## 2018-05-06 MED FILL — FUROSEMIDE 40 MG TAB: 40 | 30 days supply | Qty: 30 | Fill #2

## 2018-05-06 MED FILL — metFORMIN HCL 1000 MG TABS: 1000 | 30 days supply | Qty: 60 | Fill #3

## 2018-05-06 MED FILL — LISINOPRIL 20 MG TAB: 20 | 30 days supply | Qty: 60 | Fill #3

## 2018-05-06 MED FILL — GEMFIBROZIL 600 MG TABS: 600 | 30 days supply | Qty: 60 | Fill #0

## 2018-05-06 MED FILL — SPIRONOLACTONE 25 MG TABLET: 25 | 30 days supply | Qty: 30 | Fill #3

## 2018-05-06 MED FILL — ATORVASTATIN CALCIUM 40 MG: 40 | 30 days supply | Qty: 30 | Fill #3

## 2018-05-06 MED FILL — AMLODIPINE BESYLATE 10 MG T: 10 | 30 days supply | Qty: 30 | Fill #3

## 2018-05-06 MED FILL — CARVEDILOL 25 MG TABLET: 25 | 30 days supply | Qty: 60 | Fill #3

## 2018-06-30 ENCOUNTER — Emergency Department (HOSPITAL_COMMUNITY)
Admission: EM | Admit: 2018-06-30 | Discharge: 2018-06-30 | Disposition: A | Discharge status: Home / Self Care | Payer: No Typology Code available for payment source | Attending: Emergency Medicine | Admitting: Emergency Medicine

## 2018-06-30 ENCOUNTER — Other Ambulatory Visit: Payer: Self-pay

## 2018-06-30 ENCOUNTER — Encounter (HOSPITAL_COMMUNITY): Payer: Self-pay | Admitting: Emergency Medicine

## 2018-06-30 ENCOUNTER — Emergency Department (HOSPITAL_COMMUNITY): Payer: No Typology Code available for payment source

## 2018-06-30 DIAGNOSIS — I5042 Chronic combined systolic (congestive) and diastolic (congestive) heart failure: Secondary | ICD-10-CM | POA: Insufficient documentation

## 2018-06-30 DIAGNOSIS — I110 Hypertensive heart disease with heart failure: Secondary | ICD-10-CM | POA: Diagnosis not present

## 2018-06-30 DIAGNOSIS — Z85038 Personal history of other malignant neoplasm of large intestine: Secondary | ICD-10-CM | POA: Insufficient documentation

## 2018-06-30 DIAGNOSIS — Z794 Long term (current) use of insulin: Secondary | ICD-10-CM | POA: Insufficient documentation

## 2018-06-30 DIAGNOSIS — Z7982 Long term (current) use of aspirin: Secondary | ICD-10-CM | POA: Insufficient documentation

## 2018-06-30 DIAGNOSIS — M79605 Pain in left leg: Secondary | ICD-10-CM | POA: Insufficient documentation

## 2018-06-30 DIAGNOSIS — E119 Type 2 diabetes mellitus without complications: Secondary | ICD-10-CM | POA: Diagnosis not present

## 2018-06-30 DIAGNOSIS — Z8673 Personal history of transient ischemic attack (TIA), and cerebral infarction without residual deficits: Secondary | ICD-10-CM | POA: Insufficient documentation

## 2018-06-30 DIAGNOSIS — Z79899 Other long term (current) drug therapy: Secondary | ICD-10-CM | POA: Diagnosis not present

## 2018-06-30 DIAGNOSIS — F17220 Nicotine dependence, chewing tobacco, uncomplicated: Secondary | ICD-10-CM | POA: Diagnosis not present

## 2018-06-30 MED ORDER — CYCLOBENZAPRINE HCL 10 MG PO TABS: 10.0000 mg | ORAL_TABLET | Freq: Two times a day (BID) | ORAL | 0 refills | Status: DC | PRN

## 2018-06-30 MED ORDER — ACETAMINOPHEN 325 MG PO TABS: 650.0000 mg | ORAL_TABLET | Freq: Four times a day (QID) | ORAL | 0 refills | Status: DC | PRN
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
## 2018-08-10 MED FILL — LISINOPRIL 20 MG TAB: 20 | 30 days supply | Qty: 60 | Fill #4

## 2018-08-10 MED FILL — FUROSEMIDE 40 MG TAB: 40 | 30 days supply | Qty: 30 | Fill #3

## 2018-08-10 MED FILL — ATORVASTATIN CALCIUM 40 MG: 40 | 30 days supply | Qty: 30 | Fill #4

## 2018-08-10 MED FILL — SPIRONOLACTONE 25 MG TABLET: 25 | 30 days supply | Qty: 30 | Fill #4

## 2018-08-10 MED FILL — AMLODIPINE BESYLATE 10 MG T: 10 | 30 days supply | Qty: 30 | Fill #4

## 2018-08-10 MED FILL — GEMFIBROZIL 600 MG TAB: 600 | 30 days supply | Qty: 60 | Fill #1

## 2018-08-10 MED FILL — metFORMIN HCL 1000 MG TABS: 1000 | 30 days supply | Qty: 60 | Fill #4

## 2018-08-10 MED FILL — CARVEDILOL 25 MG TABLET: 25 | 30 days supply | Qty: 60 | Fill #4

## 2018-10-12 ENCOUNTER — Other Ambulatory Visit: Payer: Self-pay | Admitting: Family Medicine

## 2018-10-12 MED FILL — LISINOPRIL 20 MG TAB: 20 | 30 days supply | Qty: 60 | Fill #5

## 2018-10-12 MED FILL — GEMFIBROZIL 600 MG TAB: 600 | 30 days supply | Qty: 60 | Fill #2

## 2018-10-12 MED FILL — FUROSEMIDE 40 MG TAB: 40 | 30 days supply | Qty: 30 | Fill #4

## 2018-10-12 MED FILL — SPIRONOLACTONE 25 MG TABLET: 25 | 30 days supply | Qty: 30 | Fill #5

## 2018-10-12 MED FILL — ATORVASTATIN CALCIUM 40 MG: 40 | 30 days supply | Qty: 30 | Fill #5

## 2018-10-12 MED FILL — metFORMIN HCL 1000 MG TABS: 1000 | 30 days supply | Qty: 60 | Fill #5

## 2018-10-12 MED FILL — AMLODIPINE BESYLATE 10 MG T: 10 | 30 days supply | Qty: 30 | Fill #5

## 2018-10-12 MED FILL — CARVEDILOL 25 MG TABLET: 25 | 30 days supply | Qty: 60 | Fill #5

## 2018-11-07 ENCOUNTER — Encounter: Payer: Self-pay | Admitting: Emergency Medicine

## 2018-11-07 ENCOUNTER — Other Ambulatory Visit: Payer: Self-pay

## 2018-11-07 ENCOUNTER — Emergency Department
Admission: EM | Admit: 2018-11-07 | Discharge: 2018-11-07 | Disposition: A | Discharge status: Home / Self Care | Payer: Self-pay | Attending: Emergency Medicine | Admitting: Emergency Medicine

## 2018-11-07 DIAGNOSIS — K029 Dental caries, unspecified: Secondary | ICD-10-CM | POA: Insufficient documentation

## 2018-11-07 DIAGNOSIS — Z85038 Personal history of other malignant neoplasm of large intestine: Secondary | ICD-10-CM | POA: Insufficient documentation

## 2018-11-07 DIAGNOSIS — F17220 Nicotine dependence, chewing tobacco, uncomplicated: Secondary | ICD-10-CM | POA: Insufficient documentation

## 2018-11-07 DIAGNOSIS — Z794 Long term (current) use of insulin: Secondary | ICD-10-CM | POA: Insufficient documentation

## 2018-11-07 DIAGNOSIS — K047 Periapical abscess without sinus: Secondary | ICD-10-CM | POA: Insufficient documentation

## 2018-11-07 DIAGNOSIS — I5042 Chronic combined systolic (congestive) and diastolic (congestive) heart failure: Secondary | ICD-10-CM | POA: Insufficient documentation

## 2018-11-07 DIAGNOSIS — E119 Type 2 diabetes mellitus without complications: Secondary | ICD-10-CM | POA: Insufficient documentation

## 2018-11-07 DIAGNOSIS — Z79899 Other long term (current) drug therapy: Secondary | ICD-10-CM | POA: Insufficient documentation

## 2018-11-07 DIAGNOSIS — Z7982 Long term (current) use of aspirin: Secondary | ICD-10-CM | POA: Insufficient documentation

## 2018-11-07 DIAGNOSIS — I110 Hypertensive heart disease with heart failure: Secondary | ICD-10-CM | POA: Insufficient documentation

## 2018-11-07 MED ORDER — LIDOCAINE-EPINEPHRINE 2 %-1:100000 IJ SOLN
1.7000 mL | Freq: Once | INTRAMUSCULAR | Status: DC
  Filled 2018-11-07: qty 1.7

## 2018-11-07 MED ORDER — AMOXICILLIN 500 MG PO CAPS
500.0000 mg | ORAL_CAPSULE | Freq: Once | ORAL | Status: AC
  Administered 2018-11-07: 500 mg via ORAL
  Filled 2018-11-07: qty 1

## 2018-11-07 MED ORDER — AMOXICILLIN 500 MG PO CAPS: 500.0000 mg | ORAL_CAPSULE | Freq: Three times a day (TID) | ORAL | 0 refills | Status: DC

## 2018-11-07 NOTE — Discharge Instructions (Addendum)
You are being treated for a dental/gum abscess. Take the antibiotic as directed. Rinse with warm-salty water to promote healing. Follow-up with Trish Fountain, or your dental provider for further management.

## 2018-11-07 NOTE — ED Triage Notes (Signed)
Dental abscess pain for about 2 days now. NAD.

## 2018-11-07 NOTE — ED Notes (Signed)
Patient states that he has a right upper dental abscess times two days. States that his has had similar in the past that go away on their own but this one is not improving.

## 2018-11-08 NOTE — ED Provider Notes (Signed)
[facility] Emergency Department Provider Note ____________________________________________  Time seen: 2102  I have reviewed the triage vital signs and the nursing notes.  HISTORY  Chief Complaint  Dental Pain  HPI Jared Hart is a 52 y.o. male presents today for evaluation of focal abscess to the right upper molar at the hard palate.  Patient reports a focal fluctuant pointing gum abscess that he admits to attempting the drain by putting a needle in it.  He presents today with continued pain and swelling.  He denies any difficulties with swallowing, eating, or controlling secretions.  Also denies any fevers, chills, sweats.  He denies any spontaneous purulent drainage.  Past Medical History:  Diagnosis Date  . CHF (congestive heart failure) ([place])   . Colon cancer ([place])   . Diabetes mellitus   . HTN (hypertension)   . Hypercholesteremia   . Stroke [facility])     Patient Active Problem List   Diagnosis Date Noted  . Hyperlipidemia 07/09/2016  . Acute CVA (cerebrovascular accident) ([place]) 07/09/2016  . Cerebrovascular accident (CVA) due to occlusion of precerebral artery ([place])   . Stroke-like symptom 07/08/2016  . TIA (transient ischemic attack) 07/08/2016  . Chronic combined systolic (congestive) and diastolic (congestive) heart failure ([place]) 07/08/2016  . Fever 04/02/2015  . Bronchiolitis 04/02/2015  . Type 2 diabetes mellitus with hyperglycemia ([place]) 04/02/2015  . Hypokalemia 04/02/2015  . Febrile illness, acute   . Fatigue 09/28/2012  . Acute exacerbation of CHF (congestive heart failure) ([place]) 09/19/2012  . VARICOSE VEINS, LOWER EXTREMITIES 08/24/2010  . Obesity 06/29/2010  . SYSTOLIC HEART FAILURE, CHRONIC 06/01/2010  . Malignant neoplasm of colon ([place]) 05/30/2010  . HYPERLIPIDEMIA 05/30/2010  . HYPERTENSION, BENIGN ESSENTIAL 05/30/2010  . CARDIOMYOPATHY 05/13/2010  . CHF 05/13/2010  . Type 2 diabetes mellitus ([place]) 12/30/1998    Past Surgical  History:  Procedure Laterality Date  . APPENDECTOMY    . CHOLECYSTECTOMY    . COLON SURGERY      Prior to Admission medications   Medication Sig Start Date End Date Taking? Authorizing Provider  acetaminophen (TYLENOL) 325 MG tablet Take 2 tablets (650 mg total) by mouth every 6 (six) hours as needed. 06/30/18   Khatri, Hina, PA-C  amLODipine (NORVASC) 10 MG tablet Take 0.5 tablets (5 mg total) by mouth daily. 12/03/17   Scot Jun, FNP  amoxicillin (AMOXIL) 500 MG capsule Take 1 capsule (500 mg total) by mouth 3 (three) times daily. 11/07/18   Zayvian Mcmurtry, Dannielle Karvonen, PA-C  aspirin 81 MG EC tablet Take 1 tablet (81 mg total) by mouth daily. 12/03/17   Scot Jun, FNP  atorvastatin (LIPITOR) 40 MG tablet TAKE 1 TABLET BY MOUTH DAILY AT 6 PM. 12/03/17   Scot Jun, FNP  BD VEO INSULIN SYR ULTRAFINE 31G X 15/64" 0.5 ML MISC USE AS DIRECTED. 12/02/17   Scot Jun, FNP  carvedilol (COREG) 25 MG tablet Take 1 tablet (25 mg total) by mouth 2 (two) times daily with a meal. 12/03/17   Scot Jun, FNP  cyclobenzaprine (FLEXERIL) 10 MG tablet Take 1 tablet (10 mg total) by mouth 2 (two) times daily as needed for muscle spasms. 06/30/18   Khatri, Hina, PA-C  docusate sodium (COLACE) 100 MG capsule Take 1 capsule (100 mg total) by mouth daily. 12/03/17   Scot Jun, FNP  furosemide (LASIX) 40 MG tablet TAKE 1 TABLET BY MOUTH DAILY. 12/02/17   Scot Jun, FNP  gemfibrozil (LOPID) 600  MG tablet Take 1 tablet (600 mg total) by mouth 2 (two) times daily before a meal. 12/03/17   Scot Jun, FNP  guaiFENesin-codeine 100-10 MG/5ML syrup Take 10 mLs by mouth every 4 (four) hours as needed for cough. 02/07/18   Arta Silence, MD  Insulin Glargine (LANTUS SOLOSTAR) 100 UNIT/ML Solostar Pen Inject 25 Units into the skin daily at 10 pm. 12/03/17   Scot Jun, FNP  insulin NPH-regular Human (NOVOLIN 70/30) (70-30) 100 UNIT/ML injection INJECT 10 UNITS INTO  THE SKIN 2 TIMES DAILY WITH A MEAL. 12/03/17   Scot Jun, FNP  Insulin Pen Needle Flossie Buffy MINI PEN NEEDLES) 31G X 6 MM MISC Use as directed 12/03/17   Scot Jun, FNP  Insulin Syringe-Needle U-100 (INSULIN SYRINGE .5CC/31GX5/16") 31G X 5/16" 0.5 ML MISC Use as per directions. 10/15/16   Micheline Chapman, NP  lisinopril (PRINIVIL,ZESTRIL) 20 MG tablet Take 1 tablet (20 mg total) by mouth 2 (two) times daily. 12/03/17   Scot Jun, FNP  metFORMIN (GLUCOPHAGE) 1000 MG tablet Take 1 tablet (1,000 mg total) by mouth 2 (two) times daily with a meal. 12/03/17   Scot Jun, FNP  spironolactone (ALDACTONE) 25 MG tablet Take 1 tablet (25 mg total) by mouth daily. 12/03/17   Scot Jun, FNP    Allergies Patient has no known allergies.  Family History  Problem Relation Age of Onset  . Diabetes Unknown   . Cancer Unknown   . Hypertension Unknown   . Hypertension Mother   . Diabetes Mother   . Cancer Mother   . Hypertension Father     Social History Social History   Tobacco Use  . Smoking status: Never Smoker  . Smokeless tobacco: Current User    Types: Snuff  Substance Use Topics  . Alcohol use: No  . Drug use: No    Review of Systems  Constitutional: Negative for fever. Eyes: Negative for visual changes. ENT: Negative for sore throat. Cardiovascular: Negative for chest pain. Respiratory: Negative for shortness of breath. Gastrointestinal: Negative for abdominal pain, vomiting and diarrhea. Genitourinary: Negative for dysuria. Musculoskeletal: Negative for back pain. Skin: Negative for rash. Neurological: Negative for headaches, focal weakness or numbness. ____________________________________________  PHYSICAL EXAM:  VITAL SIGNS: ED Triage Vitals  Enc Vitals Group     BP 11/07/18 1907 (!) 147/69     Pulse Rate 11/07/18 1907 (!) 107     Resp 11/07/18 1907 18     Temp 11/07/18 1907 98 F (36.7 C)     Temp Source 11/07/18 1907 Oral      SpO2 11/07/18 1907 97 %     Weight 11/07/18 1907 230 lb (104.3 kg)     Height 11/07/18 1907 6\' 1"  (1.854 m)     Head Circumference --      Peak Flow --      Pain Score 11/07/18 1909 6     Pain Loc --      Pain Edu? --      Excl. in [place]? --     Constitutional: Alert and oriented. Well appearing and in no distress. Head: Normocephalic and atraumatic. Eyes: Conjunctivae are normal. Normal extraocular movements Mouth/Throat: Mucous membranes are moist.  Poor dentition and chronically broken and decayed teeth throughout.  He does have a focal pointing fluctuant abscess collection to the medial border of his second molar in the upper mandible. Neck: Supple. No thyromegaly. Hematological/Lymphatic/Immunological: No cervical lymphadenopathy. Cardiovascular: Normal rate, regular rhythm. Normal  distal pulses. Respiratory: Normal respiratory effort. No wheezes/rales/rhonchi. Skin:  Skin is warm, dry and intact. No rash noted. ____________________________________________  PROCEDURES  Amoxicillin 500 mg PO  .Marland KitchenIncision and Drainage Date/Time: 11/08/2018 12:10 AM Performed by: Melvenia Needles, PA-C Authorized by: Melvenia Needles, PA-C   Consent:    Consent obtained:  Verbal   Consent given by:  Patient   Risks discussed:  Bleeding, pain and incomplete drainage   Alternatives discussed:  Alternative treatment Location:    Type:  Abscess   Location:  Mouth   Mouth location:  Palate Anesthesia (see MAR for exact dosages):    Anesthesia method:  Local infiltration   Local anesthetic:  Lidocaine 1% WITH epi Procedure type:    Complexity:  Simple Procedure details:    Needle aspiration: no     Incision types:  Stab incision   Incision depth:  Submucosal   Scalpel blade:  11   Drainage:  Purulent   Drainage amount:  Moderate   Wound treatment:  Wound left open   Packing materials:  None Post-procedure details:    Patient tolerance of procedure:  Tolerated well, no  immediate complications  ____________________________________________  INITIAL IMPRESSION / ASSESSMENT AND PLAN / ED COURSE  Patient with ED evaluation of a focal abscess to the right medial upper mandible.  Patient has poor dentition throughout and the focal abscesses I &D with a simple stab incision.  Patient is treated empirically with amoxicillin.  He is encouraged to gargle with warm salty water to promote healing.  He will follow-up with Trish Fountain or his dental provider for definitive management. ____________________________________________  FINAL CLINICAL IMPRESSION(S) / ED DIAGNOSES  Final diagnoses:  Dental abscess  Dental caries      Melvenia Needles, PA-C 11/08/18 0012    Lavonia Drafts, MD 11/08/18 1759

## 2019-01-06 ENCOUNTER — Ambulatory Visit (INDEPENDENT_AMBULATORY_CARE_PROVIDER_SITE_OTHER): Payer: Self-pay | Admitting: Family Medicine

## 2019-01-06 ENCOUNTER — Other Ambulatory Visit: Payer: Self-pay | Admitting: Family Medicine

## 2019-01-06 ENCOUNTER — Encounter: Payer: Self-pay | Admitting: Family Medicine

## 2019-01-06 VITALS — BP 150/80 | HR 84 | Temp 98.0°F | Ht 73.0 in | Wt 239.0 lb

## 2019-01-06 DIAGNOSIS — Z0000 Encounter for general adult medical examination without abnormal findings: Secondary | ICD-10-CM

## 2019-01-06 DIAGNOSIS — I5042 Chronic combined systolic (congestive) and diastolic (congestive) heart failure: Secondary | ICD-10-CM

## 2019-01-06 DIAGNOSIS — R8290 Unspecified abnormal findings in urine: Secondary | ICD-10-CM

## 2019-01-06 DIAGNOSIS — Z23 Encounter for immunization: Secondary | ICD-10-CM

## 2019-01-06 DIAGNOSIS — E785 Hyperlipidemia, unspecified: Secondary | ICD-10-CM

## 2019-01-06 DIAGNOSIS — K5900 Constipation, unspecified: Secondary | ICD-10-CM

## 2019-01-06 DIAGNOSIS — I10 Essential (primary) hypertension: Secondary | ICD-10-CM

## 2019-01-06 DIAGNOSIS — E781 Pure hyperglyceridemia: Secondary | ICD-10-CM

## 2019-01-06 DIAGNOSIS — E118 Type 2 diabetes mellitus with unspecified complications: Secondary | ICD-10-CM

## 2019-01-06 DIAGNOSIS — Z09 Encounter for follow-up examination after completed treatment for conditions other than malignant neoplasm: Secondary | ICD-10-CM

## 2019-01-06 LAB — POCT URINALYSIS DIP (MANUAL ENTRY)
Bilirubin, UA: NEGATIVE
Glucose, UA: 500 mg/dL — AB
Ketones, POC UA: NEGATIVE mg/dL
Leukocytes, UA: NEGATIVE
Nitrite, UA: NEGATIVE
Protein Ur, POC: >=300 mg/dL — AB
Spec Grav, UA: >=1.03 — AB (ref 1.010–1.025)
Urobilinogen, UA: 0.2 E.U./dL
pH, UA: 6 (ref 5.0–8.0)

## 2019-01-06 LAB — POCT GLYCOSYLATED HEMOGLOBIN (HGB A1C): Hemoglobin A1C: 9.7 % — AB (ref 4.0–5.6)

## 2019-01-06 LAB — GLUCOSE, POCT (MANUAL RESULT ENTRY): POC Glucose: 250 mg/dl — AB (ref 70–99)

## 2019-01-06 MED ORDER — INSULIN GLARGINE 100 UNIT/ML SOLOSTAR PEN: 25.0000 [IU] | PEN_INJECTOR | Freq: Every day | SUBCUTANEOUS | 6 refills | Status: DC

## 2019-01-06 MED ORDER — CARVEDILOL 25 MG PO TABS: 25.0000 mg | ORAL_TABLET | Freq: Two times a day (BID) | ORAL | 6 refills | Status: DC

## 2019-01-06 MED ORDER — INSULIN NPH ISOPHANE & REGULAR (70-30) 100 UNIT/ML ~~LOC~~ SUSP: SUBCUTANEOUS | 6 refills | Status: DC

## 2019-01-06 MED ORDER — FUROSEMIDE 40 MG PO TABS: 40.0000 mg | ORAL_TABLET | Freq: Every day | ORAL | 6 refills | Status: DC

## 2019-01-06 MED ORDER — AMLODIPINE BESYLATE 10 MG PO TABS: 5.0000 mg | ORAL_TABLET | Freq: Every day | ORAL | 6 refills | Status: DC

## 2019-01-06 MED ORDER — ASPIRIN 81 MG PO TBEC: 81.0000 mg | DELAYED_RELEASE_TABLET | Freq: Every day | ORAL | 6 refills | Status: DC

## 2019-01-06 MED ORDER — CLONIDINE HCL 0.1 MG PO TABS
0.2000 mg | ORAL_TABLET | Freq: Once | ORAL | Status: AC
  Administered 2019-01-06: 0.2 mg via ORAL

## 2019-01-06 MED ORDER — SPIRONOLACTONE 25 MG PO TABS: 25.0000 mg | ORAL_TABLET | Freq: Every day | ORAL | 6 refills | Status: DC

## 2019-01-06 MED ORDER — DOCUSATE SODIUM 100 MG PO CAPS: 100.0000 mg | ORAL_CAPSULE | Freq: Every day | ORAL | 6 refills | Status: DC

## 2019-01-06 MED ORDER — METFORMIN HCL 1000 MG PO TABS: 1000.0000 mg | ORAL_TABLET | Freq: Two times a day (BID) | ORAL | 6 refills | Status: DC

## 2019-01-06 MED FILL — AMLODIPINE BESYLATE 10 MG T: 10 | 30 days supply | Qty: 15 | Fill #0

## 2019-01-06 MED FILL — FUROSEMIDE 40 MG TAB: 40 | 30 days supply | Qty: 30 | Fill #0

## 2019-01-06 MED FILL — !LANTUS SOLOSTAR 100UNITS/M: 100 | 24 days supply | Qty: 6 | Fill #0

## 2019-01-06 MED FILL — metFORMIN HCL 1000 MG TABS: 1000 | 30 days supply | Qty: 60 | Fill #0

## 2019-01-06 MED FILL — SPIRONOLACTONE 25 MG TABLET: 25 | 30 days supply | Qty: 30 | Fill #0

## 2019-01-06 MED FILL — !NOVOLIN 70/30 100 UNITS/ML: (70-30) 100 | 28 days supply | Qty: 10 | Fill #0

## 2019-01-06 MED FILL — CARVEDILOL 25 MG TABLET: 25 | 30 days supply | Qty: 60 | Fill #0
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
## 2019-01-07 LAB — LIPID PANEL
Chol/HDL Ratio: 5.1 ratio — ABNORMAL HIGH (ref 0.0–5.0)
Cholesterol, Total: 117 mg/dL (ref 100–199)
HDL: 23 mg/dL — ABNORMAL LOW (ref >39)
LDL Calculated: 55 mg/dL (ref 0–99)
Triglycerides: 197 mg/dL — ABNORMAL HIGH (ref 0–149)
VLDL Cholesterol Cal: 39 mg/dL (ref 5–40)

## 2019-01-07 LAB — COMPREHENSIVE METABOLIC PANEL
ALT: 10 IU/L (ref 0–44)
AST: 23 IU/L (ref 0–40)
Albumin/Globulin Ratio: 1.4 (ref 1.2–2.2)
Albumin: 3.4 g/dL — ABNORMAL LOW (ref 3.5–5.5)
Alkaline Phosphatase: 93 IU/L (ref 39–117)
BUN/Creatinine Ratio: 8 — ABNORMAL LOW (ref 9–20)
BUN: 7 mg/dL (ref 6–24)
Bilirubin Total: 0.2 mg/dL (ref 0.0–1.2)
CO2: 24 mmol/L (ref 20–29)
Calcium: 9 mg/dL (ref 8.7–10.2)
Chloride: 101 mmol/L (ref 96–106)
Creatinine, Ser: 0.88 mg/dL (ref 0.76–1.27)
GFR calc Af Amer: 116 mL/min/{1.73_m2} (ref >59)
GFR calc non Af Amer: 100 mL/min/{1.73_m2} (ref >59)
Globulin, Total: 2.5 g/dL (ref 1.5–4.5)
Glucose: 297 mg/dL — ABNORMAL HIGH (ref 65–99)
Potassium: 4.7 mmol/L (ref 3.5–5.2)
Sodium: 137 mmol/L (ref 134–144)
Total Protein: 5.9 g/dL — ABNORMAL LOW (ref 6.0–8.5)

## 2019-01-07 LAB — CBC WITH DIFFERENTIAL/PLATELET
Basophils Absolute: 0 10*3/uL (ref 0.0–0.2)
Basos: 0 %
EOS (ABSOLUTE): 0.2 10*3/uL (ref 0.0–0.4)
Eos: 2 %
Hematocrit: 34.5 % — ABNORMAL LOW (ref 37.5–51.0)
Hemoglobin: 11.8 g/dL — ABNORMAL LOW (ref 13.0–17.7)
Immature Grans (Abs): 0 10*3/uL (ref 0.0–0.1)
Immature Granulocytes: 1 %
Lymphocytes Absolute: 1.8 10*3/uL (ref 0.7–3.1)
Lymphs: 28 %
MCH: 28.7 pg (ref 26.6–33.0)
MCHC: 34.2 g/dL (ref 31.5–35.7)
MCV: 84 fL (ref 79–97)
Monocytes Absolute: 0.4 10*3/uL (ref 0.1–0.9)
Monocytes: 7 %
Neutrophils Absolute: 3.9 10*3/uL (ref 1.4–7.0)
Neutrophils: 62 %
Platelets: 295 10*3/uL (ref 150–450)
RBC: 4.11 x10E6/uL — ABNORMAL LOW (ref 4.14–5.80)
RDW: 12.5 % (ref 11.6–15.4)
WBC: 6.3 10*3/uL (ref 3.4–10.8)

## 2019-01-07 LAB — TSH: TSH: 0.969 u[IU]/mL (ref 0.450–4.500)

## 2019-01-08 ENCOUNTER — Telehealth: Payer: Self-pay

## 2019-01-08 LAB — URINE CULTURE: Organism ID, Bacteria: NO GROWTH

## 2019-01-08 NOTE — Telephone Encounter (Signed)
-----   Message from Jared Hart, [place] sent at 01/07/2019  7:51 PM EST ----- Regarding: "Blood pressure re-check" Please put blood pressure re-check into patient's chart, and inform me once complete, then I will close his chart. Thank you.

## 2019-01-08 NOTE — Telephone Encounter (Signed)
Blood pressure is in chart

## 2019-01-13 ENCOUNTER — Ambulatory Visit: Payer: Self-pay | Admitting: Family Medicine

## 2019-01-13 VITALS — BP 150/70 | HR 74 | Ht 73.0 in | Wt 239.0 lb

## 2019-01-13 DIAGNOSIS — Z0130 Encounter for examination of blood pressure without abnormal findings: Secondary | ICD-10-CM

## 2019-02-13 ENCOUNTER — Encounter: Payer: Self-pay | Admitting: Emergency Medicine

## 2019-02-13 DIAGNOSIS — Z7982 Long term (current) use of aspirin: Secondary | ICD-10-CM | POA: Insufficient documentation

## 2019-02-13 DIAGNOSIS — Z794 Long term (current) use of insulin: Secondary | ICD-10-CM | POA: Insufficient documentation

## 2019-02-13 DIAGNOSIS — I5022 Chronic systolic (congestive) heart failure: Secondary | ICD-10-CM | POA: Insufficient documentation

## 2019-02-13 DIAGNOSIS — I110 Hypertensive heart disease with heart failure: Secondary | ICD-10-CM | POA: Insufficient documentation

## 2019-02-13 DIAGNOSIS — Z79899 Other long term (current) drug therapy: Secondary | ICD-10-CM | POA: Insufficient documentation

## 2019-02-13 DIAGNOSIS — F17220 Nicotine dependence, chewing tobacco, uncomplicated: Secondary | ICD-10-CM | POA: Insufficient documentation

## 2019-02-13 DIAGNOSIS — E119 Type 2 diabetes mellitus without complications: Secondary | ICD-10-CM | POA: Insufficient documentation

## 2019-02-13 DIAGNOSIS — L02416 Cutaneous abscess of left lower limb: Secondary | ICD-10-CM | POA: Insufficient documentation

## 2019-02-13 NOTE — ED Triage Notes (Signed)
Pt with raised reddened area to the left hip area. Pt denies drainage at this time. Pt c/o pain in the left hip and down leg.

## 2019-02-14 ENCOUNTER — Emergency Department
Admission: EM | Admit: 2019-02-14 | Discharge: 2019-02-14 | Disposition: A | Discharge status: Home / Self Care | Payer: Self-pay | Attending: Emergency Medicine | Admitting: Emergency Medicine

## 2019-02-14 ENCOUNTER — Other Ambulatory Visit: Payer: Self-pay

## 2019-02-14 DIAGNOSIS — L0291 Cutaneous abscess, unspecified: Secondary | ICD-10-CM

## 2019-02-14 LAB — COMPREHENSIVE METABOLIC PANEL
ALBUMIN: 3.3 g/dL — AB (ref 3.5–5.0)
ALT: 7 U/L (ref 0–44)
AST: 13 U/L — ABNORMAL LOW (ref 15–41)
Alkaline Phosphatase: 99 U/L (ref 38–126)
Anion gap: 6 (ref 5–15)
BUN: 19 mg/dL (ref 6–20)
CHLORIDE: 102 mmol/L (ref 98–111)
CO2: 25 mmol/L (ref 22–32)
CREATININE: 0.79 mg/dL (ref 0.61–1.24)
Calcium: 8.9 mg/dL (ref 8.9–10.3)
GFR calc Af Amer: >60 mL/min (ref >60)
GFR calc non Af Amer: >60 mL/min (ref >60)
GLUCOSE: 296 mg/dL — AB (ref 70–99)
Potassium: 3.8 mmol/L (ref 3.5–5.1)
SODIUM: 133 mmol/L — AB (ref 135–145)
Total Bilirubin: 0.5 mg/dL (ref 0.3–1.2)
Total Protein: 6.9 g/dL (ref 6.5–8.1)

## 2019-02-14 LAB — CBC WITH DIFFERENTIAL/PLATELET
ABS IMMATURE GRANULOCYTES: 0.08 10*3/uL — AB (ref 0.00–0.07)
BASOS ABS: 0 10*3/uL (ref 0.0–0.1)
Basophils Relative: 1 %
Eosinophils Absolute: 0.2 10*3/uL (ref 0.0–0.5)
Eosinophils Relative: 3 %
HCT: 30 % — ABNORMAL LOW (ref 39.0–52.0)
HEMOGLOBIN: 10.1 g/dL — AB (ref 13.0–17.0)
IMMATURE GRANULOCYTES: 1 %
LYMPHS PCT: 32 %
Lymphs Abs: 2.7 10*3/uL (ref 0.7–4.0)
MCH: 28.5 pg (ref 26.0–34.0)
MCHC: 33.7 g/dL (ref 30.0–36.0)
MCV: 84.5 fL (ref 80.0–100.0)
Monocytes Absolute: 0.7 10*3/uL (ref 0.1–1.0)
Monocytes Relative: 8 %
NEUTROS ABS: 4.8 10*3/uL (ref 1.7–7.7)
NEUTROS PCT: 55 %
NRBC: 0 % (ref 0.0–0.2)
Platelets: 313 10*3/uL (ref 150–400)
RBC: 3.55 MIL/uL — AB (ref 4.22–5.81)
RDW: 12.7 % (ref 11.5–15.5)
WBC: 8.6 10*3/uL (ref 4.0–10.5)

## 2019-02-14 MED ORDER — DOXYCYCLINE HYCLATE 100 MG PO CAPS: 100.0000 mg | ORAL_CAPSULE | Freq: Two times a day (BID) | ORAL | 0 refills | Status: AC

## 2019-02-14 MED ORDER — OXYCODONE-ACETAMINOPHEN 5-325 MG PO TABS
1.0000 | ORAL_TABLET | Freq: Once | ORAL | Status: AC
  Administered 2019-02-14: 1 via ORAL

## 2019-02-14 MED ORDER — DOXYCYCLINE HYCLATE 100 MG PO CAPS: 100.0000 mg | ORAL_CAPSULE | Freq: Two times a day (BID) | ORAL | 0 refills | Status: DC

## 2019-02-14 MED ORDER — OXYCODONE-ACETAMINOPHEN 5-325 MG PO TABS
ORAL_TABLET | ORAL | Status: AC
  Filled 2019-02-14: qty 1

## 2019-02-14 MED ORDER — DOXYCYCLINE HYCLATE 100 MG PO TABS: 100.0000 mg | ORAL_TABLET | Freq: Once | ORAL | Status: DC
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
## 2019-02-14 NOTE — Discharge Instructions (Signed)
As we discussed, at this point there is nothing drainable to open up in the abscess on your left hip.  Please take the full course of prescribed antibiotics and follow-up with your doctor next week.  If you develop any worsening symptoms that concern you, please return to the emergency department.

## 2019-02-14 NOTE — ED Notes (Signed)
Pt says he noticed an area to his left groin/hip area about 1 week ago that was sore and irritating; now area has increased; tender to area; redness and warmth to area; denies drainage; denies fever; denies nausea/vomiting; pt says he had a similar area a few months ago to his suprapubic area that resolved on it's own;

## 2019-02-15 ENCOUNTER — Encounter: Payer: Self-pay | Admitting: Emergency Medicine

## 2019-02-15 ENCOUNTER — Emergency Department
Admission: EM | Admit: 2019-02-15 | Discharge: 2019-02-15 | Disposition: A | Discharge status: Home / Self Care | Payer: Self-pay | Attending: Emergency Medicine | Admitting: Emergency Medicine

## 2019-02-15 ENCOUNTER — Other Ambulatory Visit: Payer: Self-pay

## 2019-02-15 DIAGNOSIS — I5042 Chronic combined systolic (congestive) and diastolic (congestive) heart failure: Secondary | ICD-10-CM | POA: Insufficient documentation

## 2019-02-15 DIAGNOSIS — L03116 Cellulitis of left lower limb: Secondary | ICD-10-CM | POA: Insufficient documentation

## 2019-02-15 DIAGNOSIS — I110 Hypertensive heart disease with heart failure: Secondary | ICD-10-CM | POA: Insufficient documentation

## 2019-02-15 DIAGNOSIS — L02416 Cutaneous abscess of left lower limb: Secondary | ICD-10-CM | POA: Insufficient documentation

## 2019-02-15 DIAGNOSIS — F17228 Nicotine dependence, chewing tobacco, with other nicotine-induced disorders: Secondary | ICD-10-CM | POA: Insufficient documentation

## 2019-02-15 DIAGNOSIS — Z7982 Long term (current) use of aspirin: Secondary | ICD-10-CM | POA: Insufficient documentation

## 2019-02-15 DIAGNOSIS — Z5320 Procedure and treatment not carried out because of patient's decision for unspecified reasons: Secondary | ICD-10-CM | POA: Insufficient documentation

## 2019-02-15 DIAGNOSIS — Z794 Long term (current) use of insulin: Secondary | ICD-10-CM | POA: Insufficient documentation

## 2019-02-15 DIAGNOSIS — Z8673 Personal history of transient ischemic attack (TIA), and cerebral infarction without residual deficits: Secondary | ICD-10-CM | POA: Insufficient documentation

## 2019-02-15 DIAGNOSIS — Z85038 Personal history of other malignant neoplasm of large intestine: Secondary | ICD-10-CM | POA: Insufficient documentation

## 2019-02-15 DIAGNOSIS — L0291 Cutaneous abscess, unspecified: Secondary | ICD-10-CM

## 2019-02-15 DIAGNOSIS — Z79899 Other long term (current) drug therapy: Secondary | ICD-10-CM | POA: Insufficient documentation

## 2019-02-15 DIAGNOSIS — E119 Type 2 diabetes mellitus without complications: Secondary | ICD-10-CM | POA: Insufficient documentation

## 2019-02-15 LAB — CBC WITH DIFFERENTIAL/PLATELET
Abs Immature Granulocytes: 0.11 10*3/uL — ABNORMAL HIGH (ref 0.00–0.07)
BASOS ABS: 0.1 10*3/uL (ref 0.0–0.1)
BASOS PCT: 1 %
EOS ABS: 0.2 10*3/uL (ref 0.0–0.5)
EOS PCT: 2 %
HCT: 31.1 % — ABNORMAL LOW (ref 39.0–52.0)
Hemoglobin: 10.8 g/dL — ABNORMAL LOW (ref 13.0–17.0)
IMMATURE GRANULOCYTES: 1 %
LYMPHS ABS: 2.4 10*3/uL (ref 0.7–4.0)
Lymphocytes Relative: 28 %
MCH: 29 pg (ref 26.0–34.0)
MCHC: 34.7 g/dL (ref 30.0–36.0)
MCV: 83.4 fL (ref 80.0–100.0)
MONOS PCT: 7 %
Monocytes Absolute: 0.6 10*3/uL (ref 0.1–1.0)
NEUTROS PCT: 61 %
NRBC: 0 % (ref 0.0–0.2)
Neutro Abs: 5.1 10*3/uL (ref 1.7–7.7)
PLATELETS: 399 10*3/uL (ref 150–400)
RBC: 3.73 MIL/uL — AB (ref 4.22–5.81)
RDW: 12.9 % (ref 11.5–15.5)
WBC: 8.4 10*3/uL (ref 4.0–10.5)

## 2019-02-15 LAB — BASIC METABOLIC PANEL
ANION GAP: 7 (ref 5–15)
BUN: 14 mg/dL (ref 6–20)
CALCIUM: 8.6 mg/dL — AB (ref 8.9–10.3)
CO2: 23 mmol/L (ref 22–32)
Chloride: 103 mmol/L (ref 98–111)
Creatinine, Ser: 1.06 mg/dL (ref 0.61–1.24)
GLUCOSE: 309 mg/dL — AB (ref 70–99)
Potassium: 3.5 mmol/L (ref 3.5–5.1)
Sodium: 133 mmol/L — ABNORMAL LOW (ref 135–145)

## 2019-02-15 LAB — LACTIC ACID, PLASMA: Lactic Acid, Venous: 2.7 mmol/L (ref 0.5–1.9)

## 2019-02-15 MED ORDER — CEPHALEXIN 500 MG PO CAPS
500.0000 mg | ORAL_CAPSULE | Freq: Once | ORAL | Status: AC
  Administered 2019-02-15: 500 mg via ORAL
  Filled 2019-02-15: qty 1

## 2019-02-15 MED ORDER — OXYCODONE-ACETAMINOPHEN 5-325 MG PO TABS: 1.0000 | ORAL_TABLET | ORAL | 0 refills | Status: DC | PRN

## 2019-02-15 MED ORDER — CEPHALEXIN 500 MG PO CAPS: 500.0000 mg | ORAL_CAPSULE | Freq: Three times a day (TID) | ORAL | 0 refills | Status: DC

## 2019-02-15 MED ORDER — OXYCODONE-ACETAMINOPHEN 5-325 MG PO TABS
1.0000 | ORAL_TABLET | Freq: Once | ORAL | Status: AC
  Administered 2019-02-15: 1 via ORAL
  Filled 2019-02-15: qty 1

## 2019-02-15 MED FILL — DOXYCYCLINE HYCLATE 100 MG: 100 | 10 days supply | Qty: 20 | Fill #0

## 2019-02-15 NOTE — ED Notes (Signed)
Dr. Burlene Arnt made aware of lactic acid of 2.7

## 2019-02-15 NOTE — ED Notes (Signed)
One set of Blood Cultures was sent to the Lab.

## 2019-02-15 NOTE — ED Triage Notes (Signed)
Pt arrived to the ED accompanied by his wife for complaints of pain and swelling on his left hip area secondary to an abscess. Pt reports that he was seen in this ED 3 day ago for the same and was prescribed antibiotics without relieve. Pt is AOx4 in no apparent distress.
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
## 2019-02-15 NOTE — Discharge Instructions (Signed)
1.  You are leaving [place].  I am concerned that your infection will worsen and make you very sick.  Please note that you are welcome to return at any time if you change your mind. 2.  Continue Doxycycline.  Add Keflex 500 mg 3 times daily x7 days. 3.  You may continue Ibuprofen and also take Percocet as needed for pain. 4.  Return to the ER at any time or for worsening symptoms, fever, persistent vomiting or other concerns.

## 2019-02-18 ENCOUNTER — Telehealth: Payer: Self-pay

## 2019-02-18 ENCOUNTER — Other Ambulatory Visit: Payer: Self-pay | Admitting: Family Medicine

## 2019-02-18 DIAGNOSIS — E781 Pure hyperglyceridemia: Secondary | ICD-10-CM

## 2019-02-18 MED ORDER — GEMFIBROZIL 600 MG PO TABS: 600.0000 mg | ORAL_TABLET | Freq: Two times a day (BID) | ORAL | 3 refills | Status: DC

## 2019-02-18 MED FILL — AMLODIPINE BESYLATE 10 MG T: 10 | 30 days supply | Qty: 15 | Fill #1

## 2019-02-18 MED FILL — SPIRONOLACTONE 25 MG TABLET: 25 | 30 days supply | Qty: 30 | Fill #1

## 2019-02-18 MED FILL — GEMFIBROZIL 600 MG TAB: 600 | 30 days supply | Qty: 60 | Fill #0

## 2019-02-18 MED FILL — !NOVOLIN 70/30 100 UNITS/ML: (70-30) 100 | 28 days supply | Qty: 10 | Fill #1

## 2019-02-18 MED FILL — FUROSEMIDE 40 MG TAB: 40 | 30 days supply | Qty: 30 | Fill #1

## 2019-02-18 MED FILL — CARVEDILOL 25 MG TABLET: 25 | 30 days supply | Qty: 60 | Fill #1

## 2019-02-18 MED FILL — !LANTUS SOLOSTAR 100UNITS/M: 100 | 24 days supply | Qty: 6 | Fill #1

## 2019-02-18 MED FILL — metFORMIN HCL 1000 MG TABS: 1000 | 30 days supply | Qty: 60 | Fill #1

## 2019-02-18 NOTE — Telephone Encounter (Signed)
-----   Message from Azzie Glatter, [place] sent at 02/18/2019 11:40 AM EST ----- Regarding: "Refills" Rx for Lopid sent to pharmacy today. He will discontinue Atorvastatin. Please inform patient.

## 2019-02-18 NOTE — Telephone Encounter (Signed)
Patient notified and will pick up results

## 2019-04-01 ENCOUNTER — Other Ambulatory Visit: Payer: Self-pay | Admitting: Family Medicine

## 2019-04-01 MED FILL — AMLODIPINE BESYLATE 10 MG T: 10 | 30 days supply | Qty: 15 | Fill #2

## 2019-04-01 MED FILL — FUROSEMIDE 40 MG TAB: 40 | 30 days supply | Qty: 30 | Fill #2

## 2019-04-01 MED FILL — GEMFIBROZIL 600 MG TAB: 600 | 30 days supply | Qty: 60 | Fill #1

## 2019-04-01 MED FILL — SPIRONOLACTONE 25 MG TABLET: 25 | 30 days supply | Qty: 30 | Fill #2

## 2019-04-01 MED FILL — CARVEDILOL 25 MG TABLET: 25 | 30 days supply | Qty: 60 | Fill #2

## 2019-04-02 MED FILL — metFORMIN HCL 1000 MG TABS: 1000 | 30 days supply | Qty: 60 | Fill #2

## 2019-04-03 MED FILL — !LANTUS SOLOSTAR 100UNITS/M: 100 | 24 days supply | Qty: 6 | Fill #2

## 2019-04-03 MED FILL — metFORMIN HCL 1000 MG TABS: 1000 | 30 days supply | Qty: 60 | Fill #3

## 2019-04-03 MED FILL — NOVOLIN 70/30 100 UNITS/ML: (70-30) 100 | 28 days supply | Qty: 10 | Fill #2

## 2019-04-07 ENCOUNTER — Ambulatory Visit: Payer: Self-pay | Admitting: Family Medicine

## 2019-05-01 MED FILL — GEMFIBROZIL 600 MG TAB: 600 | 30 days supply | Qty: 60 | Fill #2

## 2019-05-01 MED FILL — ?AMLODIPINE BESYLATE 10 MG: 10 | 30 days supply | Qty: 15 | Fill #3

## 2019-05-01 MED FILL — ?FUROSEMIDE 40 MG TABLET: 40 | 30 days supply | Qty: 30 | Fill #3

## 2019-05-01 MED FILL — ?METFORMIN HCL 1000 MG TAB: 1000 | 30 days supply | Qty: 60 | Fill #4

## 2019-05-01 MED FILL — ?CARVEDILOL 25 MG TABLET: 25 | 30 days supply | Qty: 60 | Fill #3

## 2019-05-01 MED FILL — ?SPIRONOLACTON 25MG TABLET: 25 | 30 days supply | Qty: 30 | Fill #3

## 2019-05-17 ENCOUNTER — Ambulatory Visit (INDEPENDENT_AMBULATORY_CARE_PROVIDER_SITE_OTHER): Payer: Self-pay | Admitting: Family Medicine

## 2019-05-17 ENCOUNTER — Other Ambulatory Visit: Payer: Self-pay

## 2019-05-17 ENCOUNTER — Encounter: Payer: Self-pay | Admitting: Family Medicine

## 2019-05-17 VITALS — BP 144/86 | HR 78 | Temp 98.0°F | Ht 73.0 in | Wt 232.0 lb

## 2019-05-17 DIAGNOSIS — Z09 Encounter for follow-up examination after completed treatment for conditions other than malignant neoplasm: Secondary | ICD-10-CM

## 2019-05-17 DIAGNOSIS — K5900 Constipation, unspecified: Secondary | ICD-10-CM | POA: Insufficient documentation

## 2019-05-17 DIAGNOSIS — E118 Type 2 diabetes mellitus with unspecified complications: Secondary | ICD-10-CM

## 2019-05-17 DIAGNOSIS — R8290 Unspecified abnormal findings in urine: Secondary | ICD-10-CM

## 2019-05-17 DIAGNOSIS — I10 Essential (primary) hypertension: Secondary | ICD-10-CM

## 2019-05-17 DIAGNOSIS — R7309 Other abnormal glucose: Secondary | ICD-10-CM | POA: Insufficient documentation

## 2019-05-17 LAB — POCT URINALYSIS DIP (MANUAL ENTRY)
Bilirubin, UA: NEGATIVE
Glucose, UA: 500 mg/dL — AB
Ketones, POC UA: NEGATIVE mg/dL
Leukocytes, UA: NEGATIVE
Nitrite, UA: NEGATIVE
Protein Ur, POC: 100 mg/dL — AB
Spec Grav, UA: 1.01 (ref 1.010–1.025)
Urobilinogen, UA: 0.2 E.U./dL
pH, UA: 5 (ref 5.0–8.0)

## 2019-05-17 LAB — POCT GLYCOSYLATED HEMOGLOBIN (HGB A1C): Hemoglobin A1C: 11.9 % — AB (ref 4.0–5.6)

## 2019-05-17 LAB — GLUCOSE, POCT (MANUAL RESULT ENTRY)
POC Glucose: 381 mg/dl — AB (ref 70–99)
POC Glucose: 363 mg/dl — AB (ref 70–99)

## 2019-05-17 MED ORDER — INSULIN GLARGINE 100 UNIT/ML SOLOSTAR PEN: 30.0000 [IU] | PEN_INJECTOR | Freq: Every day | SUBCUTANEOUS | 6 refills | Status: DC

## 2019-05-17 MED ORDER — BLOOD GLUCOSE METER KIT: PACK | 0 refills | Status: AC

## 2019-05-17 MED ORDER — POLYETHYLENE GLYCOL 3350 17 GM/SCOOP PO POWD: 17.0000 g | Freq: Every day | ORAL | 3 refills | Status: DC

## 2019-05-17 MED ORDER — GLIPIZIDE 10 MG PO TABS: 10.0000 mg | ORAL_TABLET | Freq: Two times a day (BID) | ORAL | 3 refills | Status: DC

## 2019-05-17 MED ORDER — INSULIN LISPRO 100 UNIT/ML ~~LOC~~ SOLN
20.0000 [IU] | Freq: Once | SUBCUTANEOUS | Status: AC
  Administered 2019-05-17: 20 [IU] via SUBCUTANEOUS

## 2019-05-17 MED FILL — POLYETHYLENE GLYCOL 3350 PO: 17 | 30 days supply | Qty: 510 | Fill #0

## 2019-05-17 MED FILL — TRUEplus LANCETS 28G MISC: 25 days supply | Qty: 100 | Fill #0

## 2019-05-17 MED FILL — !TRUE METRIX BLOOD GLUCOSE: 30 days supply | Qty: 1 | Fill #0

## 2019-05-17 MED FILL — !LANTUS SOLOSTAR 100UNITS/M: 100 | 15 days supply | Qty: 15 | Fill #0

## 2019-05-17 MED FILL — TRUE METRIX TEST STRIP: 30 days supply | Qty: 100 | Fill #0

## 2019-05-17 MED FILL — glipiZIDE 10 MG TABS: 10 | 30 days supply | Qty: 60 | Fill #0

## 2019-05-17 NOTE — Patient Instructions (Signed)
Constipation, Adult Constipation is when a person:  Poops (has a bowel movement) fewer times in a week than normal.  Has a hard time pooping.  Has poop that is dry, hard, or bigger than normal. Follow these instructions at home: Eating and drinking   Eat foods that have a lot of fiber, such as: ? Fresh fruits and vegetables. ? Whole grains. ? Beans.  Eat less of foods that are high in fat, low in fiber, or overly processed, such as: ? [place] fries. ? Hamburgers. ? Cookies. ? Candy. ? Soda.  Drink enough fluid to keep your pee (urine) clear or pale yellow. General instructions  Exercise regularly or as told by your doctor.  Go to the restroom when you feel like you need to poop. Do not hold it in.  Take over-the-counter and prescription medicines only as told by your doctor. These include any fiber supplements.  Do pelvic floor retraining exercises, such as: ? Doing deep breathing while relaxing your lower belly (abdomen). ? Relaxing your pelvic floor while pooping.  Watch your condition for any changes.  Keep all follow-up visits as told by your doctor. This is important. Contact a doctor if:  You have pain that gets worse.  You have a fever.  You have not pooped for 4 days.  You throw up (vomit).  You are not hungry.  You lose weight.  You are bleeding from the anus.  You have thin, pencil-like poop (stool). Get help right away if:  You have a fever, and your symptoms suddenly get worse.  You leak poop or have blood in your poop.  Your belly feels hard or bigger than normal (is bloated).  You have very bad belly pain.  You feel dizzy or you faint. This information is not intended to replace advice given to you by your health care provider. Make sure you discuss any questions you have with your health care provider. Document Released: 06/03/2008 Document Revised: 07/05/2016 Document Reviewed: 06/05/2016 Elsevier Interactive Patient Education   2019 Elsevier Inc. Polyethylene Glycol powder What is this medicine? POLYETHYLENE GLYCOL 3350 (pol ee ETH i leen; GLYE col) powder is a laxative used to treat constipation. It increases the amount of water in the stool. Bowel movements become easier and more frequent. This medicine may be used for other purposes; ask your health care provider or pharmacist if you have questions. COMMON BRAND NAME(S): Sharlyn Bologna, GlycoLax, Healthylax, MiraLax, Smooth LAX, Vita Health What should I tell my health care provider before I take this medicine? They need to know if you have any of these conditions: -a history of blockage of the stomach or intestine -current abdomen distension or pain -difficulty swallowing -diverticulitis, ulcerative colitis, or other chronic bowel disease -phenylketonuria -an unusual or allergic reaction to polyethylene glycol, other medicines, dyes, or preservatives -pregnant or trying to get pregnant -breast-feeding How should I use this medicine? Take this medicine by mouth. The bottle has a measuring cap that is marked with a line. Pour the powder into the cap up to the marked line (the dose is about 1 heaping tablespoon). Add the powder in the cap to a full glass (4 to 8 ounces or 120 to 240 mL) of water, juice, soda, coffee or tea. Mix the powder well. Ensure that the powder is fully dissolved. Do not drink if there are any clumps. Drink the solution. Take exactly as directed. Do not take your medicine more often than directed. Talk to your pediatrician regarding the use  of this medicine in children. Special care may be needed. Overdosage: If you think you have taken too much of this medicine contact a poison control center or emergency room at once. NOTE: This medicine is only for you. Do not share this medicine with others. What if I miss a dose? If you miss a dose, take it as soon as you can. If it is almost time for your next dose, take only that dose. Do not take  double or extra doses. What may interact with this medicine? Interactions are not expected. This list may not describe all possible interactions. Give your health care provider a list of all the medicines, herbs, non-prescription drugs, or dietary supplements you use. Also tell them if you smoke, drink alcohol, or use illegal drugs. Some items may interact with your medicine. What should I watch for while using this medicine? Do not use for more than 2 weeks without advice from your doctor or health care professional. It can take 2 to 4 days to have a bowel movement and to experience improvement in constipation. See your health care professional for any changes in bowel habits, including constipation, that are severe or last longer than three weeks. Always take this medicine with plenty of water. What side effects may I notice from receiving this medicine? Side effects that you should report to your doctor or health care professional as soon as possible: -diarrhea -difficulty breathing -itching of the skin, hives, or skin rash -severe bloating, pain, or distension of the stomach -vomiting Side effects that usually do not require medical attention (report to your doctor or health care professional if they continue or are bothersome): -bloating or gas -lower abdominal discomfort or cramps -nausea This list may not describe all possible side effects. Call your doctor for medical advice about side effects. You may report side effects to FDA at 1-800-FDA-1088. Where should I keep my medicine? Keep out of the reach of children. Store between 15 and 30 degrees C (59 and 86 degrees F). Throw away any unused medicine after the expiration date. NOTE: This sheet is a summary. It may not cover all possible information. If you have questions about this medicine, talk to your doctor, pharmacist, or health care provider.  2019 Elsevier/Gold Standard (2018-06-04 10:42:01)
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
## 2019-05-19 LAB — URINE CULTURE: Organism ID, Bacteria: NO GROWTH

## 2019-06-08 MED FILL — GEMFIBROZIL 600 MG TAB: 600 | 30 days supply | Qty: 60 | Fill #3

## 2019-06-08 MED FILL — ?FUROSEMIDE 40 MG TABLET: 40 | 30 days supply | Qty: 30 | Fill #4

## 2019-06-08 MED FILL — ?CARVEDILOL 25 MG TABLET: 25 | 30 days supply | Qty: 60 | Fill #4

## 2019-06-08 MED FILL — ?SPIRONOLACTON 25MG TABLET: 25 | 30 days supply | Qty: 30 | Fill #4

## 2019-06-08 MED FILL — metFORMIN HCL 1000 MG TABS: 1000 | 30 days supply | Qty: 60 | Fill #5

## 2019-06-08 MED FILL — ?AMLODIPINE BESYLATE 10 MG: 10 | 30 days supply | Qty: 15 | Fill #4

## 2019-06-10 MED FILL — NOVOLIN 70/30 100 UNITS/ML: (70-30) 100 | 28 days supply | Qty: 10 | Fill #3

## 2019-06-17 ENCOUNTER — Telehealth: Payer: Self-pay

## 2019-06-17 NOTE — Telephone Encounter (Signed)
Patient negative for screening and will be coming in office

## 2019-06-18 ENCOUNTER — Ambulatory Visit (INDEPENDENT_AMBULATORY_CARE_PROVIDER_SITE_OTHER): Payer: Self-pay | Admitting: Family Medicine

## 2019-06-18 ENCOUNTER — Other Ambulatory Visit: Payer: Self-pay

## 2019-06-18 VITALS — BP 142/80 | HR 78 | Temp 98.3°F | Ht 73.0 in | Wt 232.0 lb

## 2019-06-18 DIAGNOSIS — I10 Essential (primary) hypertension: Secondary | ICD-10-CM

## 2019-06-18 DIAGNOSIS — Z09 Encounter for follow-up examination after completed treatment for conditions other than malignant neoplasm: Secondary | ICD-10-CM

## 2019-06-18 DIAGNOSIS — R7309 Other abnormal glucose: Secondary | ICD-10-CM

## 2019-06-18 DIAGNOSIS — E118 Type 2 diabetes mellitus with unspecified complications: Secondary | ICD-10-CM

## 2019-06-18 LAB — POCT URINALYSIS DIP (MANUAL ENTRY)
Bilirubin, UA: NEGATIVE
Glucose, UA: NEGATIVE mg/dL
Ketones, POC UA: NEGATIVE mg/dL
Leukocytes, UA: NEGATIVE
Nitrite, UA: NEGATIVE
Protein Ur, POC: 100 mg/dL — AB
Spec Grav, UA: 1.015 (ref 1.010–1.025)
Urobilinogen, UA: 0.2 E.U./dL
pH, UA: 5.5 (ref 5.0–8.0)

## 2019-06-18 LAB — GLUCOSE, POCT (MANUAL RESULT ENTRY): POC Glucose: 124 mg/dl — AB (ref 70–99)
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
## 2019-06-28 ENCOUNTER — Telehealth: Payer: Self-pay | Admitting: Family Medicine

## 2019-06-28 ENCOUNTER — Telehealth: Payer: Self-pay

## 2019-06-28 DIAGNOSIS — I10 Essential (primary) hypertension: Secondary | ICD-10-CM

## 2019-06-28 MED ORDER — FUROSEMIDE 40 MG PO TABS: 40.0000 mg | ORAL_TABLET | Freq: Every day | ORAL | 6 refills | Status: DC

## 2019-06-28 NOTE — Telephone Encounter (Signed)
Patient notified

## 2019-06-28 NOTE — Telephone Encounter (Signed)
Tried to contact patient no answer 

## 2019-06-28 NOTE — Addendum Note (Signed)
Addended by: Jefferson Fuel on: 06/28/2019 03:50 PM   Modules accepted: Orders

## 2019-06-28 NOTE — Telephone Encounter (Signed)
cay 

## 2019-07-15 ENCOUNTER — Other Ambulatory Visit: Payer: Self-pay

## 2019-07-15 DIAGNOSIS — E781 Pure hyperglyceridemia: Secondary | ICD-10-CM

## 2019-07-15 MED ORDER — GEMFIBROZIL 600 MG PO TABS: 600.0000 mg | ORAL_TABLET | Freq: Two times a day (BID) | ORAL | 3 refills | Status: DC

## 2019-07-15 MED FILL — metFORMIN HCL 1000 MG TABS: 1000 | 30 days supply | Qty: 60 | Fill #6

## 2019-07-15 MED FILL — ?AMLODIPINE BESYLATE 10 MG: 10 | 30 days supply | Qty: 15 | Fill #5

## 2019-07-15 MED FILL — GEMFIBROZIL 600 MG TAB: 600 | 30 days supply | Qty: 60 | Fill #0

## 2019-07-15 MED FILL — ?SPIRONOLACTON 25MG TABLET: 25 | 30 days supply | Qty: 30 | Fill #5

## 2019-07-15 MED FILL — ?CARVEDILOL 25 MG TABLET: 25 | 30 days supply | Qty: 60 | Fill #5

## 2019-07-15 MED FILL — FUROSEMIDE 40 MG TAB: 40 | 30 days supply | Qty: 30 | Fill #0

## 2019-07-15 NOTE — Telephone Encounter (Signed)
Medication sent to pharmacy  

## 2019-08-18 ENCOUNTER — Other Ambulatory Visit: Payer: Self-pay | Admitting: Family Medicine

## 2019-08-18 DIAGNOSIS — E118 Type 2 diabetes mellitus with unspecified complications: Secondary | ICD-10-CM

## 2019-08-18 MED FILL — FUROSEMIDE 40 MG TAB: 40 | 30 days supply | Qty: 30 | Fill #1

## 2019-08-18 MED FILL — ?AMLODIPINE BESYLATE 10 MG: 10 | 30 days supply | Qty: 15 | Fill #6

## 2019-08-18 MED FILL — ?CARVEDILOL 25 MG TABLET: 25 | 30 days supply | Qty: 60 | Fill #6

## 2019-08-18 MED FILL — metFORMIN HCL 1000 MG TABS: 1000 | 30 days supply | Qty: 60 | Fill #0

## 2019-08-18 MED FILL — GEMFIBROZIL 600 MG TAB: 600 | 30 days supply | Qty: 60 | Fill #1

## 2019-08-18 MED FILL — SPIRONOLACTONE 25 MG TABLET: 25 | 30 days supply | Qty: 30 | Fill #6

## 2019-09-20 ENCOUNTER — Other Ambulatory Visit: Payer: Self-pay

## 2019-09-20 ENCOUNTER — Encounter: Payer: Self-pay | Admitting: Family Medicine

## 2019-09-20 ENCOUNTER — Ambulatory Visit (INDEPENDENT_AMBULATORY_CARE_PROVIDER_SITE_OTHER): Payer: Self-pay | Admitting: Family Medicine

## 2019-09-20 VITALS — BP 148/79 | HR 73 | Temp 97.9°F | Ht 73.0 in | Wt 242.2 lb

## 2019-09-20 DIAGNOSIS — Z09 Encounter for follow-up examination after completed treatment for conditions other than malignant neoplasm: Secondary | ICD-10-CM

## 2019-09-20 DIAGNOSIS — I10 Essential (primary) hypertension: Secondary | ICD-10-CM

## 2019-09-20 DIAGNOSIS — E118 Type 2 diabetes mellitus with unspecified complications: Secondary | ICD-10-CM

## 2019-09-20 DIAGNOSIS — K5900 Constipation, unspecified: Secondary | ICD-10-CM

## 2019-09-20 DIAGNOSIS — G8929 Other chronic pain: Secondary | ICD-10-CM | POA: Insufficient documentation

## 2019-09-20 DIAGNOSIS — M79672 Pain in left foot: Secondary | ICD-10-CM

## 2019-09-20 LAB — POCT URINALYSIS DIPSTICK
Bilirubin, UA: NEGATIVE
Blood, UA: NEGATIVE
Glucose, UA: NEGATIVE
Ketones, UA: NEGATIVE
Leukocytes, UA: NEGATIVE
Nitrite, UA: NEGATIVE
Protein, UA: POSITIVE — AB
Spec Grav, UA: >=1.03 — AB (ref 1.010–1.025)
Urobilinogen, UA: 0.2 E.U./dL
pH, UA: 5.5 (ref 5.0–8.0)

## 2019-09-20 LAB — POCT GLYCOSYLATED HEMOGLOBIN (HGB A1C)
HbA1c POC (<> result, manual entry): 7.7 % (ref 4.0–5.6)
HbA1c, POC (controlled diabetic range): 7.7 % — AB (ref 0.0–7.0)
HbA1c, POC (prediabetic range): 7.7 % — AB (ref 5.7–6.4)
Hemoglobin A1C: 7.7 % — AB (ref 4.0–5.6)

## 2019-09-20 LAB — POCT GLUCOSE (DEVICE FOR HOME USE)
Glucose Fasting, POC: 137 mg/dL — AB (ref 70–99)
POC Glucose: 137 mg/dl — AB (ref 70–99)

## 2019-09-20 MED ORDER — GABAPENTIN 100 MG PO CAPS: 100.0000 mg | ORAL_CAPSULE | Freq: Two times a day (BID) | ORAL | 3 refills | Status: DC

## 2019-09-20 MED FILL — GABAPENTIN 100 MG CAP: 100 | 30 days supply | Qty: 60 | Fill #0
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
## 2019-09-22 ENCOUNTER — Other Ambulatory Visit: Payer: Self-pay | Admitting: Family Medicine

## 2019-09-22 DIAGNOSIS — I10 Essential (primary) hypertension: Secondary | ICD-10-CM

## 2019-09-22 MED FILL — metFORMIN HCL 1000 MG TABS: 1000 | 30 days supply | Qty: 60 | Fill #1

## 2019-09-22 MED FILL — FUROSEMIDE 40 MG TAB: 40 | 30 days supply | Qty: 30 | Fill #2

## 2019-09-22 MED FILL — ?CARVEDILOL 25 MG TABLET: 25 | 30 days supply | Qty: 60 | Fill #0

## 2019-09-22 MED FILL — GEMFIBROZIL 600 MG TAB: 600 | 30 days supply | Qty: 60 | Fill #2

## 2019-09-22 MED FILL — AMLODIPINE BESYLATE 10 MG T: 10 | 30 days supply | Qty: 15 | Fill #7

## 2019-09-22 MED FILL — ?SPIRONOLACTONE 25 MG TABLE: 25 | 30 days supply | Qty: 30 | Fill #0
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
## 2019-10-20 MED FILL — ?FUROSEMIDE 40 MG TABLET: 40 | 30 days supply | Qty: 30 | Fill #3

## 2019-10-20 MED FILL — ?AMLODIPINE BESYLATE 10 MG: 10 | 30 days supply | Qty: 15 | Fill #8

## 2019-10-20 MED FILL — ?SPIRONOLACTONE 25 MG TABLE: 25 | 30 days supply | Qty: 30 | Fill #1

## 2019-10-20 MED FILL — metFORMIN HCL 1000 MG TABS: 1000 | 30 days supply | Qty: 60 | Fill #2

## 2019-10-20 MED FILL — GABAPENTIN 100 MG CAP: 100 | 30 days supply | Qty: 60 | Fill #1

## 2019-10-20 MED FILL — ?CARVEDILOL 25MG TABLE: 25 | 30 days supply | Qty: 60 | Fill #1

## 2019-10-20 MED FILL — GEMFIBROZIL 600 MG TAB: 600 | 30 days supply | Qty: 60 | Fill #3

## 2019-11-15 MED FILL — !NOVOLIN 70/30 100 UNITS/ML: (70-30) 100 | 28 days supply | Qty: 10 | Fill #4

## 2019-11-15 MED FILL — metFORMIN HCL 1000 MG TABS: 1000 | 30 days supply | Qty: 60 | Fill #3

## 2019-11-15 MED FILL — ?FUROSEMIDE 40 MG TABLET: 40 | 30 days supply | Qty: 30 | Fill #4

## 2019-11-30 ENCOUNTER — Other Ambulatory Visit: Payer: Self-pay | Admitting: Family Medicine

## 2019-11-30 DIAGNOSIS — E781 Pure hyperglyceridemia: Secondary | ICD-10-CM

## 2019-11-30 MED FILL — ?AMLODIPINE BESYLATE 10 MG: 10 | 30 days supply | Qty: 15 | Fill #9

## 2019-11-30 MED FILL — ?SPIRONOLACTONE 25 MG TABLE: 25 | 30 days supply | Qty: 30 | Fill #2

## 2019-11-30 MED FILL — ?FUROSEMIDE 40 MG TABLET: 40 | 30 days supply | Qty: 30 | Fill #5

## 2019-11-30 MED FILL — GABAPENTIN 100 MG CAP: 100 | 30 days supply | Qty: 60 | Fill #2

## 2019-11-30 MED FILL — GEMFIBROZIL 600 MG TAB: 600 | 30 days supply | Qty: 60 | Fill #0

## 2019-12-15 MED FILL — ?HUMULIN 70/30 VIAL: (70-30) 100 | 28 days supply | Qty: 10 | Fill #5

## 2019-12-31 DIAGNOSIS — R809 Proteinuria, unspecified: Secondary | ICD-10-CM

## 2019-12-31 HISTORY — DX: Proteinuria, unspecified: R80.9

## 2020-01-05 MED FILL — GEMFIBROZIL 600 MG TAB: 600 | 30 days supply | Qty: 60 | Fill #1

## 2020-01-05 MED FILL — ?SPIRONOLACTONE 25 MG TABLE: 25 | 30 days supply | Qty: 30 | Fill #3

## 2020-01-05 MED FILL — ?HUMULIN 70/30 VIAL: (70-30) 100 | 28 days supply | Qty: 10 | Fill #6

## 2020-01-05 MED FILL — GABAPENTIN 100 MG CAP: 100 | 30 days supply | Qty: 60 | Fill #3

## 2020-01-05 MED FILL — metFORMIN HCL 1000 MG TABS: 1000 | 30 days supply | Qty: 60 | Fill #4

## 2020-01-05 MED FILL — ?CARVEDILOL 25MG TABLE: 25 | 30 days supply | Qty: 60 | Fill #2

## 2020-01-05 MED FILL — ?FUROSEMIDE 40 MG TABLET: 40 | 30 days supply | Qty: 30 | Fill #6

## 2020-01-05 MED FILL — ?AMLODIPINE BESYLATE 10 MG: 10 | 30 days supply | Qty: 15 | Fill #10

## 2020-01-21 ENCOUNTER — Other Ambulatory Visit: Payer: Self-pay

## 2020-01-21 ENCOUNTER — Encounter: Payer: Self-pay | Admitting: Family Medicine

## 2020-01-21 ENCOUNTER — Ambulatory Visit (INDEPENDENT_AMBULATORY_CARE_PROVIDER_SITE_OTHER): Payer: Self-pay | Admitting: Family Medicine

## 2020-01-21 VITALS — BP 129/65 | HR 68 | Temp 97.6°F | Ht 73.0 in | Wt 246.0 lb

## 2020-01-21 DIAGNOSIS — Z09 Encounter for follow-up examination after completed treatment for conditions other than malignant neoplasm: Secondary | ICD-10-CM

## 2020-01-21 DIAGNOSIS — Z0000 Encounter for general adult medical examination without abnormal findings: Secondary | ICD-10-CM

## 2020-01-21 DIAGNOSIS — R809 Proteinuria, unspecified: Secondary | ICD-10-CM

## 2020-01-21 DIAGNOSIS — E785 Hyperlipidemia, unspecified: Secondary | ICD-10-CM

## 2020-01-21 DIAGNOSIS — I10 Essential (primary) hypertension: Secondary | ICD-10-CM

## 2020-01-21 DIAGNOSIS — R8290 Unspecified abnormal findings in urine: Secondary | ICD-10-CM

## 2020-01-21 DIAGNOSIS — E118 Type 2 diabetes mellitus with unspecified complications: Secondary | ICD-10-CM

## 2020-01-21 DIAGNOSIS — Z23 Encounter for immunization: Secondary | ICD-10-CM

## 2020-01-21 DIAGNOSIS — R7309 Other abnormal glucose: Secondary | ICD-10-CM

## 2020-01-21 LAB — POCT URINALYSIS DIPSTICK
Bilirubin, UA: NEGATIVE
Glucose, UA: NEGATIVE
Ketones, UA: NEGATIVE
Leukocytes, UA: NEGATIVE
Nitrite, UA: NEGATIVE
Protein, UA: POSITIVE — AB
Spec Grav, UA: 1.025 (ref 1.010–1.025)
Urobilinogen, UA: 0.2 E.U./dL
pH, UA: 5 (ref 5.0–8.0)

## 2020-01-21 LAB — POCT GLYCOSYLATED HEMOGLOBIN (HGB A1C): Hemoglobin A1C: 10.1 % — AB (ref 4.0–5.6)

## 2020-01-21 LAB — GLUCOSE, POCT (MANUAL RESULT ENTRY): POC Glucose: 221 mg/dl — AB (ref 70–99)
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
## 2020-01-22 LAB — MICROALBUMIN / CREATININE URINE RATIO
Creatinine, Urine: 71.1 mg/dL
Microalb/Creat Ratio: 845 mg/g creat — ABNORMAL HIGH (ref 0–29)
Microalbumin, Urine: 601 ug/mL

## 2020-01-23 ENCOUNTER — Encounter: Payer: Self-pay | Admitting: Family Medicine

## 2020-01-23 DIAGNOSIS — R809 Proteinuria, unspecified: Secondary | ICD-10-CM | POA: Insufficient documentation

## 2020-01-23 LAB — URINE CULTURE: Organism ID, Bacteria: NO GROWTH

## 2020-01-26 ENCOUNTER — Encounter: Payer: Self-pay | Admitting: Family Medicine

## 2020-02-08 ENCOUNTER — Other Ambulatory Visit: Payer: Self-pay | Admitting: Family Medicine

## 2020-02-08 DIAGNOSIS — M79672 Pain in left foot: Secondary | ICD-10-CM

## 2020-02-08 DIAGNOSIS — E118 Type 2 diabetes mellitus with unspecified complications: Secondary | ICD-10-CM

## 2020-02-08 DIAGNOSIS — I10 Essential (primary) hypertension: Secondary | ICD-10-CM

## 2020-02-08 DIAGNOSIS — G8929 Other chronic pain: Secondary | ICD-10-CM

## 2020-02-08 MED FILL — metFORMIN HCL 1000 MG TABS: 1000 | 30 days supply | Qty: 60 | Fill #5

## 2020-02-08 MED FILL — GABAPENTIN 100 MG CAPSULE: 100 | 30 days supply | Qty: 60 | Fill #0

## 2020-02-08 MED FILL — ?SPIRONOLACTONE 25 MG TABLE: 25 | 30 days supply | Qty: 30 | Fill #4

## 2020-02-08 MED FILL — ?CARVEDILOL 25 MG TABLET: 25 | 30 days supply | Qty: 60 | Fill #3

## 2020-02-08 MED FILL — GEMFIBROZIL 600 MG TAB: 600 | 30 days supply | Qty: 60 | Fill #2

## 2020-02-08 MED FILL — ?FUROSEMIDE 40 MG TABLET: 40 | 30 days supply | Qty: 30 | Fill #0

## 2020-02-08 MED FILL — AMLODIPINE BESYLATE 10 MG T: 10 | 30 days supply | Qty: 15 | Fill #0

## 2020-02-08 MED FILL — ?HUMULIN 70/30 VIAL: (70-30) 100 | 31 days supply | Qty: 10 | Fill #0

## 2020-03-15 MED FILL — GEMFIBROZIL 600 MG TAB: 600 | 30 days supply | Qty: 60 | Fill #3

## 2020-03-15 MED FILL — ?CARVEDILOL 25 MG TABLET: 25 | 30 days supply | Qty: 60 | Fill #4

## 2020-03-15 MED FILL — AMLODIPINE BESYLATE 10 MG T: 10 | 30 days supply | Qty: 15 | Fill #1

## 2020-03-15 MED FILL — ?HUMULIN 70/30 VIAL: (70-30) 100 | 31 days supply | Qty: 10 | Fill #1

## 2020-03-15 MED FILL — ?SPIRONOLACTONE 25 MG TABLE: 25 | 30 days supply | Qty: 30 | Fill #5

## 2020-03-15 MED FILL — ?FUROSEMIDE 40 MG TABLET: 40 | 30 days supply | Qty: 30 | Fill #1

## 2020-03-15 MED FILL — metFORMIN HCL 1000 MG TABS: 1000 | 30 days supply | Qty: 60 | Fill #6

## 2020-03-15 MED FILL — GABAPENTIN 100 MG CAPSULE: 100 | 30 days supply | Qty: 60 | Fill #1

## 2020-04-13 ENCOUNTER — Other Ambulatory Visit: Payer: Self-pay | Admitting: Family Medicine

## 2020-04-13 DIAGNOSIS — E118 Type 2 diabetes mellitus with unspecified complications: Secondary | ICD-10-CM

## 2020-04-13 DIAGNOSIS — E781 Pure hyperglyceridemia: Secondary | ICD-10-CM

## 2020-04-13 MED FILL — AMLODIPINE BESYLATE 10 MG T: 10 | 30 days supply | Qty: 15 | Fill #2

## 2020-04-13 MED FILL — ?CARVEDILOL 25 MG TABLET: 25 | 30 days supply | Qty: 60 | Fill #5

## 2020-04-13 MED FILL — GEMFIBROZIL 600 MG TAB: 600 | 30 days supply | Qty: 60 | Fill #0

## 2020-04-13 MED FILL — metFORMIN HCL 1000 MG TABS: 1000 | 30 days supply | Qty: 60 | Fill #0

## 2020-04-13 MED FILL — GABAPENTIN 100 MG CAPSULE: 100 | 30 days supply | Qty: 60 | Fill #2

## 2020-04-13 MED FILL — ?HUMULIN 70/30 VIAL: (70-30) 100 | 31 days supply | Qty: 10 | Fill #2

## 2020-04-13 MED FILL — ?FUROSEMIDE 40 MG TABLET: 40 | 30 days supply | Qty: 30 | Fill #2

## 2020-04-13 MED FILL — ?SPIRONOLACTONE 25 MG TABLE: 25 | 30 days supply | Qty: 30 | Fill #6

## 2020-04-24 ENCOUNTER — Ambulatory Visit: Payer: Self-pay | Admitting: Family Medicine

## 2020-05-15 ENCOUNTER — Other Ambulatory Visit: Payer: Self-pay | Admitting: Family Medicine

## 2020-05-15 DIAGNOSIS — I10 Essential (primary) hypertension: Secondary | ICD-10-CM

## 2020-05-15 MED FILL — AMLODIPINE BESYLATE 10 MG T: 10 | 30 days supply | Qty: 15 | Fill #3

## 2020-05-15 MED FILL — ?CARVEDILOL 25 MG TABLET: 25 | 30 days supply | Qty: 60 | Fill #6

## 2020-05-15 MED FILL — metFORMIN HCL 1000 MG TABS: 1000 | 30 days supply | Qty: 60 | Fill #1

## 2020-05-15 MED FILL — GEMFIBROZIL 600 MG TAB: 600 | 30 days supply | Qty: 60 | Fill #1

## 2020-05-15 MED FILL — ?SPIRONOLACTONE 25 MG TABLE: 25 | 30 days supply | Qty: 30 | Fill #0

## 2020-05-15 MED FILL — HUMULIN 70/30 VIAL: (70-30) 100 | 31 days supply | Qty: 10 | Fill #3

## 2020-05-15 MED FILL — GABAPENTIN 100 MG CAPSULE: 100 | 30 days supply | Qty: 60 | Fill #3

## 2020-05-15 MED FILL — FUROSEMIDE 40 MG TAB: 40 | 30 days supply | Qty: 30 | Fill #3

## 2020-06-15 MED FILL — GEMFIBROZIL 600 MG TAB: 600 | 30 days supply | Qty: 60 | Fill #2

## 2020-06-15 MED FILL — HUMULIN 70/30 VIAL: (70-30) 100 | 31 days supply | Qty: 10 | Fill #4

## 2020-06-15 MED FILL — FUROSEMIDE 40 MG TAB: 40 | 30 days supply | Qty: 30 | Fill #4

## 2020-06-15 MED FILL — metFORMIN HCL 1000 MG TABS: 1000 | 30 days supply | Qty: 60 | Fill #2

## 2020-06-15 MED FILL — AMLODIPINE BESYLATE 10 MG T: 10 | 30 days supply | Qty: 15 | Fill #4

## 2020-06-15 MED FILL — ?SPIRONOLACTONE 25 MG TABLE: 25 | 30 days supply | Qty: 30 | Fill #1

## 2020-06-16 ENCOUNTER — Other Ambulatory Visit: Payer: Self-pay

## 2020-06-16 ENCOUNTER — Telehealth: Payer: Self-pay | Admitting: Family Medicine

## 2020-06-16 DIAGNOSIS — M79672 Pain in left foot: Secondary | ICD-10-CM

## 2020-06-16 NOTE — Telephone Encounter (Signed)
Pt called having trouble getting refills on bp and heart meds. Pt needs a refill on amlodipine and carvedilol.

## 2020-06-17 ENCOUNTER — Other Ambulatory Visit: Payer: Self-pay | Admitting: Family Medicine

## 2020-06-17 MED ORDER — GABAPENTIN 100 MG PO CAPS: 100.0000 mg | ORAL_CAPSULE | Freq: Two times a day (BID) | ORAL | 6 refills | Status: DC

## 2020-06-19 ENCOUNTER — Telehealth: Payer: Self-pay | Admitting: Family Medicine

## 2020-06-19 ENCOUNTER — Other Ambulatory Visit: Payer: Self-pay | Admitting: Family Medicine

## 2020-06-19 DIAGNOSIS — I10 Essential (primary) hypertension: Secondary | ICD-10-CM

## 2020-06-19 MED ORDER — CARVEDILOL 25 MG PO TABS: 25.0000 mg | ORAL_TABLET | Freq: Two times a day (BID) | ORAL | 11 refills | Status: DC

## 2020-06-19 MED FILL — ?CARVEDILOL 25 MG TABLET: 25 | 30 days supply | Qty: 60 | Fill #0

## 2020-06-19 MED FILL — GABAPENTIN 100 MG CAPSULE: 100 | 30 days supply | Qty: 60 | Fill #0

## 2020-06-21 NOTE — Telephone Encounter (Signed)
Done

## 2020-06-26 ENCOUNTER — Other Ambulatory Visit: Payer: Self-pay | Admitting: Family Medicine

## 2020-06-26 ENCOUNTER — Encounter: Payer: Self-pay | Admitting: Family Medicine

## 2020-06-26 ENCOUNTER — Ambulatory Visit (INDEPENDENT_AMBULATORY_CARE_PROVIDER_SITE_OTHER): Payer: Self-pay | Admitting: Family Medicine

## 2020-06-26 ENCOUNTER — Other Ambulatory Visit: Payer: Self-pay

## 2020-06-26 VITALS — BP 139/68 | HR 74 | Temp 98.1°F | Ht 73.0 in | Wt 247.0 lb

## 2020-06-26 DIAGNOSIS — I10 Essential (primary) hypertension: Secondary | ICD-10-CM

## 2020-06-26 DIAGNOSIS — E118 Type 2 diabetes mellitus with unspecified complications: Secondary | ICD-10-CM

## 2020-06-26 DIAGNOSIS — Z09 Encounter for follow-up examination after completed treatment for conditions other than malignant neoplasm: Secondary | ICD-10-CM

## 2020-06-26 DIAGNOSIS — M79672 Pain in left foot: Secondary | ICD-10-CM

## 2020-06-26 DIAGNOSIS — K5900 Constipation, unspecified: Secondary | ICD-10-CM

## 2020-06-26 DIAGNOSIS — Z8673 Personal history of transient ischemic attack (TIA), and cerebral infarction without residual deficits: Secondary | ICD-10-CM

## 2020-06-26 DIAGNOSIS — R7309 Other abnormal glucose: Secondary | ICD-10-CM

## 2020-06-26 DIAGNOSIS — G8929 Other chronic pain: Secondary | ICD-10-CM

## 2020-06-26 DIAGNOSIS — E781 Pure hyperglyceridemia: Secondary | ICD-10-CM

## 2020-06-26 DIAGNOSIS — R809 Proteinuria, unspecified: Secondary | ICD-10-CM

## 2020-06-26 LAB — POCT URINALYSIS DIPSTICK
Bilirubin, UA: NEGATIVE
Glucose, UA: NEGATIVE
Ketones, UA: NEGATIVE
Leukocytes, UA: NEGATIVE
Nitrite, UA: NEGATIVE
Protein, UA: POSITIVE — AB
Spec Grav, UA: 1.02 (ref 1.010–1.025)
Urobilinogen, UA: 0.2 E.U./dL
pH, UA: 5 (ref 5.0–8.0)

## 2020-06-26 LAB — POCT GLYCOSYLATED HEMOGLOBIN (HGB A1C)
HbA1c POC (<> result, manual entry): 9 % (ref 4.0–5.6)
HbA1c, POC (controlled diabetic range): 9 % — AB (ref 0.0–7.0)
HbA1c, POC (prediabetic range): 9 % — AB (ref 5.7–6.4)
Hemoglobin A1C: 9 % — AB (ref 4.0–5.6)

## 2020-06-26 LAB — GLUCOSE, POCT (MANUAL RESULT ENTRY): POC Glucose: 291 mg/dl — AB (ref 70–99)

## 2020-06-26 MED ORDER — GEMFIBROZIL 600 MG PO TABS: 600.0000 mg | ORAL_TABLET | Freq: Two times a day (BID) | ORAL | 11 refills | Status: DC

## 2020-06-26 MED ORDER — FUROSEMIDE 40 MG PO TABS: 40.0000 mg | ORAL_TABLET | Freq: Every day | ORAL | 11 refills | Status: DC

## 2020-06-26 MED ORDER — AMLODIPINE BESYLATE 10 MG PO TABS: ORAL_TABLET | ORAL | 11 refills | Status: DC

## 2020-06-26 MED ORDER — HUMULIN 70/30 (70-30) 100 UNIT/ML ~~LOC~~ SUSP: SUBCUTANEOUS | 11 refills | Status: DC

## 2020-06-26 MED ORDER — SPIRONOLACTONE 25 MG PO TABS: 25.0000 mg | ORAL_TABLET | Freq: Every day | ORAL | 11 refills | Status: DC

## 2020-06-26 MED ORDER — LANTUS SOLOSTAR 100 UNIT/ML ~~LOC~~ SOPN
30.0000 [IU] | PEN_INJECTOR | Freq: Every day | SUBCUTANEOUS | 11 refills | Status: DC
  Filled 2021-04-18: qty 9, 30d supply, fill #0
  Filled 2021-05-25: qty 9, 30d supply, fill #1

## 2020-06-26 MED ORDER — GABAPENTIN 100 MG PO CAPS: 100.0000 mg | ORAL_CAPSULE | Freq: Two times a day (BID) | ORAL | 11 refills | Status: DC

## 2020-06-26 MED ORDER — GLIPIZIDE 10 MG PO TABS: 10.0000 mg | ORAL_TABLET | Freq: Two times a day (BID) | ORAL | 11 refills | Status: DC

## 2020-06-26 MED ORDER — POLYETHYLENE GLYCOL 3350 17 GM/SCOOP PO POWD: 17.0000 g | Freq: Every day | ORAL | 11 refills | Status: DC

## 2020-06-26 MED ORDER — METFORMIN HCL 1000 MG PO TABS: 1000.0000 mg | ORAL_TABLET | Freq: Two times a day (BID) | ORAL | 11 refills | Status: DC

## 2020-06-26 MED FILL — POLYETHYLENE GLYCOL 3350 PO: 17 | 30 days supply | Qty: 238 | Fill #0

## 2020-06-26 MED FILL — glipiZIDE 10 MG TABS: 10 | 30 days supply | Qty: 60 | Fill #0

## 2020-06-26 MED FILL — !LANTUS SOLOSTAR 100UNITS/M: 100 | 30 days supply | Qty: 9 | Fill #0
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
## 2020-06-27 LAB — CBC WITH DIFFERENTIAL/PLATELET
Basophils Absolute: 0 10*3/uL (ref 0.0–0.2)
Basos: 1 %
EOS (ABSOLUTE): 0.2 10*3/uL (ref 0.0–0.4)
Eos: 3 %
Hematocrit: 34.5 % — ABNORMAL LOW (ref 37.5–51.0)
Hemoglobin: 11.4 g/dL — ABNORMAL LOW (ref 13.0–17.7)
Immature Grans (Abs): 0.1 10*3/uL (ref 0.0–0.1)
Immature Granulocytes: 1 %
Lymphocytes Absolute: 2.1 10*3/uL (ref 0.7–3.1)
Lymphs: 32 %
MCH: 29.2 pg (ref 26.6–33.0)
MCHC: 33 g/dL (ref 31.5–35.7)
MCV: 89 fL (ref 79–97)
Monocytes Absolute: 0.5 10*3/uL (ref 0.1–0.9)
Monocytes: 7 %
Neutrophils Absolute: 3.6 10*3/uL (ref 1.4–7.0)
Neutrophils: 56 %
Platelets: 297 10*3/uL (ref 150–450)
RBC: 3.9 x10E6/uL — ABNORMAL LOW (ref 4.14–5.80)
RDW: 12.7 % (ref 11.6–15.4)
WBC: 6.5 10*3/uL (ref 3.4–10.8)

## 2020-06-27 LAB — LIPID PANEL
Chol/HDL Ratio: 5.5 ratio — ABNORMAL HIGH (ref 0.0–5.0)
Cholesterol, Total: 170 mg/dL (ref 100–199)
HDL: 31 mg/dL — ABNORMAL LOW (ref >39)
LDL Chol Calc (NIH): 91 mg/dL (ref 0–99)
Triglycerides: 287 mg/dL — ABNORMAL HIGH (ref 0–149)
VLDL Cholesterol Cal: 48 mg/dL — ABNORMAL HIGH (ref 5–40)

## 2020-06-27 LAB — MICROALBUMIN / CREATININE URINE RATIO
Creatinine, Urine: 52.1 mg/dL
Microalb/Creat Ratio: 710 mg/g creat — ABNORMAL HIGH (ref 0–29)
Microalbumin, Urine: 370.1 ug/mL

## 2020-06-27 LAB — PSA: Prostate Specific Ag, Serum: 0.4 ng/mL (ref 0.0–4.0)

## 2020-06-27 LAB — VITAMIN D 25 HYDROXY (VIT D DEFICIENCY, FRACTURES): Vit D, 25-Hydroxy: 27.9 ng/mL — ABNORMAL LOW (ref 30.0–100.0)

## 2020-06-27 LAB — TSH: TSH: 1.28 u[IU]/mL (ref 0.450–4.500)

## 2020-06-27 LAB — VITAMIN B12: Vitamin B-12: 449 pg/mL (ref 232–1245)

## 2020-07-17 MED FILL — !HUMULIN 70/30 VIAL: 70/30 | 28 days supply | Qty: 10 | Fill #5

## 2020-07-17 MED FILL — metFORMIN HCL 1000 MG TABS: 1000 | 30 days supply | Qty: 60 | Fill #3

## 2020-07-17 MED FILL — GABAPENTIN 100 MG CAPSULE: 100 | 30 days supply | Qty: 60 | Fill #1

## 2020-07-17 MED FILL — FUROSEMIDE 40 MG TAB: 40 | 30 days supply | Qty: 30 | Fill #5

## 2020-07-17 MED FILL — SPIRONOLACTONE 25 MG TABLET: 25 | 30 days supply | Qty: 30 | Fill #2

## 2020-07-17 MED FILL — GEMFIBROZIL 600 MG TAB: 600 | 30 days supply | Qty: 60 | Fill #3

## 2020-07-17 MED FILL — CARVEDILOL 25 MG TABLET: 25 | 30 days supply | Qty: 60 | Fill #1

## 2020-07-17 MED FILL — AMLODIPINE BESYLATE 10 MG T: 10 | 30 days supply | Qty: 15 | Fill #5

## 2020-08-14 MED FILL — glipiZIDE 10 MG TABS: 10 | 30 days supply | Qty: 60 | Fill #1

## 2020-08-14 MED FILL — metFORMIN HCL 1000 MG TABS: 1000 | 30 days supply | Qty: 60 | Fill #4

## 2020-08-14 MED FILL — GABAPENTIN 100 MG CAPSULE: 100 | 30 days supply | Qty: 60 | Fill #2

## 2020-08-14 MED FILL — AMLODIPINE BESYLATE 10 MG T: 10 | 30 days supply | Qty: 15 | Fill #6

## 2020-08-14 MED FILL — CARVEDILOL 25 MG TABLET: 25 | 30 days supply | Qty: 60 | Fill #2

## 2020-08-14 MED FILL — HUMULIN 70/30 VIAL: (70-30) 100 | 28 days supply | Qty: 10 | Fill #6

## 2020-08-14 MED FILL — GEMFIBROZIL 600 MG TAB: 600 | 30 days supply | Qty: 60 | Fill #0

## 2020-08-14 MED FILL — SPIRONOLACTONE 25 MG TABLET: 25 | 30 days supply | Qty: 30 | Fill #3

## 2020-08-14 MED FILL — FUROSEMIDE 40 MG TAB: 40 | 30 days supply | Qty: 30 | Fill #6

## 2020-08-14 MED FILL — LANTUS SOLOSTAR 100 UNITS/M: 100 | 30 days supply | Qty: 9 | Fill #1

## 2020-09-15 MED FILL — GEMFIBROZIL 600 MG TAB: 600 | 30 days supply | Qty: 60 | Fill #1

## 2020-09-15 MED FILL — AMLODIPINE BESYLATE 10 MG T: 10 | 30 days supply | Qty: 15 | Fill #0

## 2020-09-15 MED FILL — metFORMIN HCL 1000 MG TABS: 1000 | 30 days supply | Qty: 60 | Fill #5

## 2020-09-15 MED FILL — glipiZIDE 10 MG TABS: 10 | 30 days supply | Qty: 60 | Fill #2

## 2020-09-15 MED FILL — CARVEDILOL 25 MG TABLET: 25 | 30 days supply | Qty: 60 | Fill #3

## 2020-09-15 MED FILL — SPIRONOLACTONE 25 MG TABLET: 25 | 30 days supply | Qty: 30 | Fill #4

## 2020-09-15 MED FILL — LANTUS SOLOSTAR 100 UNITS/M: 100 | 30 days supply | Qty: 9 | Fill #2

## 2020-09-15 MED FILL — HUMULIN 70/30 VIAL: (70-30) 100 | 28 days supply | Qty: 10 | Fill #0

## 2020-09-15 MED FILL — FUROSEMIDE 40 MG TAB: 40 | 30 days supply | Qty: 30 | Fill #0

## 2020-09-15 MED FILL — GABAPENTIN 100 MG CAPSULE: 100 | 30 days supply | Qty: 60 | Fill #3

## 2020-10-17 MED FILL — glipiZIDE 10 MG TABS: 10 | 30 days supply | Qty: 60 | Fill #3

## 2020-10-17 MED FILL — SPIRONOLACTONE 25 MG TABLET: 25 | 30 days supply | Qty: 30 | Fill #5

## 2020-10-17 MED FILL — LANTUS SOLOSTAR 100 UNITS/M: 100 | 30 days supply | Qty: 9 | Fill #3

## 2020-10-17 MED FILL — AMLODIPINE BESYLATE 10 MG T: 10 | 30 days supply | Qty: 15 | Fill #1

## 2020-10-17 MED FILL — METFORMIN HCL 1000 MG TABS: 1000 | 30 days supply | Qty: 60 | Fill #6

## 2020-10-17 MED FILL — FUROSEMIDE 40 MG TAB: 40 | 30 days supply | Qty: 30 | Fill #1

## 2020-10-17 MED FILL — GABAPENTIN 100 MG CAPSULE: 100 | 30 days supply | Qty: 60 | Fill #4

## 2020-10-17 MED FILL — GEMFIBROZIL 600 MG TAB: 600 | 30 days supply | Qty: 60 | Fill #2

## 2020-10-17 MED FILL — CARVEDILOL 25 MG TABLET: 25 | 30 days supply | Qty: 60 | Fill #4

## 2020-10-17 MED FILL — HUMULIN 70/30 VIAL: (70-30) 100 | 28 days supply | Qty: 10 | Fill #1

## 2020-11-13 MED FILL — GABAPENTIN 100 MG CAPSULE: 100 | 30 days supply | Qty: 60 | Fill #5

## 2020-11-13 MED FILL — GEMFIBROZIL 600 MG TAB: 600 | 30 days supply | Qty: 60 | Fill #3

## 2020-11-13 MED FILL — FUROSEMIDE 40 MG TAB: 40 | 30 days supply | Qty: 30 | Fill #2

## 2020-11-13 MED FILL — CARVEDILOL 25 MG TABLET: 25 | 30 days supply | Qty: 60 | Fill #5

## 2020-11-13 MED FILL — glipiZIDE 10 MG TABS: 10 | 30 days supply | Qty: 60 | Fill #4

## 2020-11-13 MED FILL — METFORMIN HCL 1000 MG TABS: 1000 | 30 days supply | Qty: 60 | Fill #0

## 2020-11-13 MED FILL — SPIRONOLACTONE 25 MG TABLET: 25 | 30 days supply | Qty: 30 | Fill #6

## 2020-11-13 MED FILL — AMLODIPINE BESYLATE 10 MG T: 10 | 30 days supply | Qty: 15 | Fill #2

## 2020-11-14 MED FILL — LANTUS SOLOSTAR 100 UNITS/M: 100 | 30 days supply | Qty: 9 | Fill #4

## 2020-11-14 MED FILL — HUMULIN 70/30 VIAL: (70-30) 100 | 28 days supply | Qty: 10 | Fill #2

## 2020-12-11 ENCOUNTER — Telehealth: Payer: Self-pay | Admitting: Family Medicine

## 2020-12-11 MED FILL — FUROSEMIDE 40 MG TAB: 40 | 30 days supply | Qty: 30 | Fill #3

## 2020-12-11 MED FILL — CARVEDILOL 25 MG TABLET: 25 | 30 days supply | Qty: 60 | Fill #6

## 2020-12-11 MED FILL — GEMFIBROZIL 600 MG TAB: 600 | 30 days supply | Qty: 60 | Fill #4

## 2020-12-11 MED FILL — GABAPENTIN 100 MG CAPSULE: 100 | 30 days supply | Qty: 60 | Fill #6

## 2020-12-11 MED FILL — LANTUS SOLOSTAR 100 UNITS/M: 100 | 30 days supply | Qty: 9 | Fill #5

## 2020-12-11 MED FILL — SPIRONOLACTONE 25 MG TABLET: 25 | 30 days supply | Qty: 30 | Fill #0

## 2020-12-11 MED FILL — METFORMIN HCL 1000 MG TABS: 1000 | 30 days supply | Qty: 60 | Fill #1

## 2020-12-11 MED FILL — AMLODIPINE BESYLATE 10 MG T: 10 | 30 days supply | Qty: 15 | Fill #3

## 2020-12-11 MED FILL — glipiZIDE 10 MG TABS: 10 | 30 days supply | Qty: 60 | Fill #5

## 2020-12-11 MED FILL — HUMULIN 70/30 VIAL: (70-30) 100 | 28 days supply | Qty: 10 | Fill #3

## 2020-12-11 NOTE — Telephone Encounter (Signed)
Patient called and notified that the pharmacy would need to be contacted for requested refills. Refills already available at the pharmacy. Patient transferred to the pharmacy to request refills.

## 2020-12-11 NOTE — Telephone Encounter (Signed)
Medication:  spironolactone (ALDACTONE) 25 MG tablet [[id number]]  metFORMIN (GLUCOPHAGE) 1000 MG tablet [[id number]]  gemfibrozil (LOPID) 600 MG tablet [[id number]]  amLODipine (NORVASC) 10 MG tablet [[id number]]  glipiZIDE (GLUCOTROL) 10 MG tablet [[id number]]  furosemide (LASIX) 40 MG tablet [[id number]]  gabapentin (NEURONTIN) 100 MG capsule [[id number]]  carvedilol (COREG) 25 MG tablet [[id number]]  insulin NPH-regular Human (HUMULIN 70/30) (70-30) 100 UNIT/ML injection [[id number]]  insulin glargine (LANTUS SOLOSTAR) 100 UNIT/ML Solostar Pen [[id number]]   Has the patient contacted their pharmacy? No  (Agent: If no, request that the patient contact the pharmacy for the refill.   Preferred Pharmacy (with phone number or street name):  [facility], [place] [address]  [place] [address]  Phone: [phone] Fax: [phone]    Agent: Please be advised that RX refills may take up to 3 business days. We ask that you follow-up with your pharmacy.

## 2020-12-26 ENCOUNTER — Ambulatory Visit (INDEPENDENT_AMBULATORY_CARE_PROVIDER_SITE_OTHER): Payer: Self-pay | Admitting: Family Medicine

## 2020-12-26 ENCOUNTER — Other Ambulatory Visit: Payer: Self-pay

## 2020-12-26 ENCOUNTER — Other Ambulatory Visit: Payer: Self-pay | Admitting: Family Medicine

## 2020-12-26 ENCOUNTER — Encounter: Payer: Self-pay | Admitting: Family Medicine

## 2020-12-26 VITALS — BP 148/86 | HR 82 | Temp 97.3°F | Ht 73.0 in | Wt 256.4 lb

## 2020-12-26 DIAGNOSIS — E118 Type 2 diabetes mellitus with unspecified complications: Secondary | ICD-10-CM

## 2020-12-26 DIAGNOSIS — Z09 Encounter for follow-up examination after completed treatment for conditions other than malignant neoplasm: Secondary | ICD-10-CM

## 2020-12-26 DIAGNOSIS — R7309 Other abnormal glucose: Secondary | ICD-10-CM

## 2020-12-26 DIAGNOSIS — I10 Essential (primary) hypertension: Secondary | ICD-10-CM

## 2020-12-26 DIAGNOSIS — R531 Weakness: Secondary | ICD-10-CM

## 2020-12-26 DIAGNOSIS — Z8673 Personal history of transient ischemic attack (TIA), and cerebral infarction without residual deficits: Secondary | ICD-10-CM
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
## 2020-12-27 LAB — URINALYSIS
Bilirubin, UA: NEGATIVE
Glucose, UA: NEGATIVE
Ketones, UA: NEGATIVE
Leukocytes,UA: NEGATIVE
Nitrite, UA: NEGATIVE
RBC, UA: NEGATIVE
Specific Gravity, UA: 1.014 (ref 1.005–1.030)
Urobilinogen, Ur: 0.2 mg/dL (ref 0.2–1.0)
pH, UA: 6 (ref 5.0–7.5)

## 2020-12-27 LAB — GLUCOSE, RANDOM: Glucose: 105 mg/dL — ABNORMAL HIGH (ref 65–99)

## 2020-12-27 LAB — HEMOGLOBIN A1C
Est. average glucose Bld gHb Est-mCnc: 143 mg/dL
Hgb A1c MFr Bld: 6.6 % — ABNORMAL HIGH (ref 4.8–5.6)

## 2021-01-11 MED FILL — LANTUS SOLOSTAR 100 UNITS/M: 100 | 30 days supply | Qty: 9 | Fill #6

## 2021-01-11 MED FILL — CARVEDILOL 25 MG TABLET: 25 | 30 days supply | Qty: 60 | Fill #7

## 2021-01-11 MED FILL — HUMULIN 70/30 VIAL: (70-30) 100 | 28 days supply | Qty: 10 | Fill #4

## 2021-01-11 MED FILL — SPIRONOLACTONE 25 MG TABLET: 25 | 30 days supply | Qty: 30 | Fill #1

## 2021-01-11 MED FILL — glipiZIDE 10 MG TABS: 10 | 30 days supply | Qty: 60 | Fill #6

## 2021-01-11 MED FILL — METFORMIN HCL 1000 MG TABS: 1000 | 30 days supply | Qty: 60 | Fill #2

## 2021-01-11 MED FILL — FUROSEMIDE 40 MG TAB: 40 | 30 days supply | Qty: 30 | Fill #4

## 2021-01-11 MED FILL — GABAPENTIN 100 MG CAPSULE: 100 | 30 days supply | Qty: 60 | Fill #0

## 2021-01-11 MED FILL — AMLODIPINE BESYLATE 10 MG T: 10 | 30 days supply | Qty: 15 | Fill #4

## 2021-01-11 MED FILL — GEMFIBROZIL 600 MG TAB: 600 | 30 days supply | Qty: 60 | Fill #5

## 2021-01-12 ENCOUNTER — Other Ambulatory Visit: Payer: Self-pay | Admitting: Family Medicine

## 2021-02-08 MED FILL — CARVEDILOL 25 MG TABLET: 25 | 30 days supply | Qty: 60 | Fill #8

## 2021-02-08 MED FILL — METFORMIN HCL 1000 MG TABS: 1000 | 30 days supply | Qty: 60 | Fill #3

## 2021-02-08 MED FILL — ?HUMULIN 70/30 VIAL: (70-30) 100 | 28 days supply | Qty: 10 | Fill #5

## 2021-02-08 MED FILL — SPIRONOLACTONE 25 MG TABLET: 25 | 30 days supply | Qty: 30 | Fill #2

## 2021-02-08 MED FILL — $LANTUS SOLOSTAR 100 UNITS/: 100 | 30 days supply | Qty: 9 | Fill #7

## 2021-02-08 MED FILL — AMLODIPINE BESYLATE 10 MG T: 10 | 30 days supply | Qty: 15 | Fill #5

## 2021-02-08 MED FILL — FUROSEMIDE 40 MG TAB: 40 | 30 days supply | Qty: 30 | Fill #5

## 2021-02-08 MED FILL — GEMFIBROZIL 600 MG TAB: 600 | 30 days supply | Qty: 60 | Fill #6

## 2021-02-08 MED FILL — GABAPENTIN 100 MG CAPSULE: 100 | 30 days supply | Qty: 60 | Fill #1

## 2021-02-08 MED FILL — glipiZIDE 10 MG TABS: 10 | 30 days supply | Qty: 60 | Fill #7

## 2021-03-08 MED FILL — GEMFIBROZIL 600 MG TAB: 600 | 30 days supply | Qty: 60 | Fill #7

## 2021-03-08 MED FILL — $LANTUS SOLOSTAR 100 UNITS/: 100 | 30 days supply | Qty: 9 | Fill #8

## 2021-03-08 MED FILL — ?glipiZIDE 10MG TABLETS: 10 | 30 days supply | Qty: 60 | Fill #8

## 2021-03-08 MED FILL — ?HUMULIN 70/30 VIAL: (70-30) 100 | 28 days supply | Qty: 10 | Fill #6

## 2021-03-08 MED FILL — ?FUROSEMIDE 40 MG TABLET: 40 | 30 days supply | Qty: 30 | Fill #6

## 2021-03-08 MED FILL — ?CARVEDILOL 25 MG TABLET: 25 | 30 days supply | Qty: 60 | Fill #9

## 2021-03-08 MED FILL — ?METFORMIN HCL 1000 MG TAB: 1000 | 30 days supply | Qty: 60 | Fill #4

## 2021-03-08 MED FILL — ?SPIRONOLACTONE 25 MG TABLE: 25 | 30 days supply | Qty: 30 | Fill #3

## 2021-03-08 MED FILL — GABAPENTIN 100 MG CAPSULE: 100 | 30 days supply | Qty: 60 | Fill #2

## 2021-03-08 MED FILL — AMLODIPINE BESYLATE 10 MG T: 10 | 30 days supply | Qty: 15 | Fill #6

## 2021-03-31 ENCOUNTER — Other Ambulatory Visit: Payer: Self-pay

## 2021-04-10 ENCOUNTER — Other Ambulatory Visit: Payer: Self-pay

## 2021-04-17 ENCOUNTER — Other Ambulatory Visit: Payer: Self-pay

## 2021-04-18 ENCOUNTER — Other Ambulatory Visit: Payer: Self-pay

## 2021-04-18 MED FILL — Insulin NPH Isophane & Regular Human Inj 100 Unit/ML (70-30): SUBCUTANEOUS | 30 days supply | Qty: 10 | Fill #0 | Status: AC

## 2021-04-18 MED FILL — Gemfibrozil Tab 600 MG: ORAL | 30 days supply | Qty: 60 | Fill #0 | Status: AC

## 2021-04-18 MED FILL — Spironolactone Tab 25 MG: ORAL | 30 days supply | Qty: 30 | Fill #0 | Status: AC

## 2021-04-18 MED FILL — Gabapentin Cap 100 MG: ORAL | 30 days supply | Qty: 60 | Fill #0 | Status: AC

## 2021-04-18 MED FILL — Metformin HCl Tab 1000 MG: ORAL | 30 days supply | Qty: 60 | Fill #0 | Status: AC

## 2021-04-18 MED FILL — Carvedilol Tab 25 MG: ORAL | 30 days supply | Qty: 60 | Fill #0 | Status: AC

## 2021-04-18 MED FILL — Amlodipine Besylate Tab 10 MG (Base Equivalent): ORAL | 30 days supply | Qty: 15 | Fill #0 | Status: AC

## 2021-04-18 MED FILL — Glipizide Tab 10 MG: ORAL | 30 days supply | Qty: 60 | Fill #0 | Status: AC

## 2021-04-18 MED FILL — Furosemide Tab 40 MG: ORAL | 30 days supply | Qty: 30 | Fill #0 | Status: AC

## 2021-04-19 ENCOUNTER — Other Ambulatory Visit: Payer: Self-pay

## 2021-04-27 ENCOUNTER — Other Ambulatory Visit: Payer: Self-pay

## 2021-04-30 ENCOUNTER — Other Ambulatory Visit: Payer: Self-pay

## 2021-05-21 ENCOUNTER — Other Ambulatory Visit: Payer: Self-pay

## 2021-05-25 ENCOUNTER — Other Ambulatory Visit: Payer: Self-pay

## 2021-05-25 MED FILL — Glipizide Tab 10 MG: ORAL | 30 days supply | Qty: 60 | Fill #1 | Status: AC

## 2021-05-25 MED FILL — Spironolactone Tab 25 MG: ORAL | 30 days supply | Qty: 30 | Fill #1 | Status: AC

## 2021-05-25 MED FILL — Furosemide Tab 40 MG: ORAL | 30 days supply | Qty: 30 | Fill #1 | Status: AC

## 2021-05-25 MED FILL — Gemfibrozil Tab 600 MG: ORAL | 30 days supply | Qty: 60 | Fill #1 | Status: AC

## 2021-05-25 MED FILL — Spironolactone Tab 25 MG: ORAL | 30 days supply | Qty: 30 | Fill #1 | Status: CN

## 2021-05-25 MED FILL — Gabapentin Cap 100 MG: ORAL | 30 days supply | Qty: 60 | Fill #1 | Status: AC

## 2021-05-25 MED FILL — Insulin NPH Isophane & Regular Human Inj 100 Unit/ML (70-30): SUBCUTANEOUS | 30 days supply | Qty: 10 | Fill #1 | Status: AC

## 2021-05-25 MED FILL — Amlodipine Besylate Tab 10 MG (Base Equivalent): ORAL | 30 days supply | Qty: 15 | Fill #1 | Status: AC

## 2021-05-25 MED FILL — Carvedilol Tab 25 MG: ORAL | 30 days supply | Qty: 60 | Fill #1 | Status: AC

## 2021-05-25 MED FILL — Metformin HCl Tab 1000 MG: ORAL | 30 days supply | Qty: 60 | Fill #1 | Status: AC

## 2021-06-05 ENCOUNTER — Other Ambulatory Visit: Payer: Self-pay

## 2021-06-18 ENCOUNTER — Other Ambulatory Visit: Payer: Self-pay

## 2021-06-27 ENCOUNTER — Other Ambulatory Visit: Payer: Self-pay

## 2021-06-27 ENCOUNTER — Other Ambulatory Visit: Payer: Self-pay | Admitting: Nurse Practitioner

## 2021-06-27 DIAGNOSIS — R7309 Other abnormal glucose: Secondary | ICD-10-CM

## 2021-06-27 DIAGNOSIS — G8929 Other chronic pain: Secondary | ICD-10-CM

## 2021-06-27 DIAGNOSIS — I10 Essential (primary) hypertension: Secondary | ICD-10-CM

## 2021-06-27 DIAGNOSIS — E781 Pure hyperglyceridemia: Secondary | ICD-10-CM

## 2021-06-27 DIAGNOSIS — E118 Type 2 diabetes mellitus with unspecified complications: Secondary | ICD-10-CM

## 2021-06-28 ENCOUNTER — Telehealth: Payer: Self-pay

## 2021-06-28 NOTE — Telephone Encounter (Signed)
All med's to be refilled per pt

## 2021-06-29 ENCOUNTER — Other Ambulatory Visit: Payer: Self-pay | Admitting: Nurse Practitioner

## 2021-06-29 ENCOUNTER — Other Ambulatory Visit: Payer: Self-pay

## 2021-06-29 DIAGNOSIS — M79672 Pain in left foot: Secondary | ICD-10-CM

## 2021-06-29 DIAGNOSIS — E781 Pure hyperglyceridemia: Secondary | ICD-10-CM

## 2021-06-29 DIAGNOSIS — R7309 Other abnormal glucose: Secondary | ICD-10-CM

## 2021-06-29 DIAGNOSIS — E118 Type 2 diabetes mellitus with unspecified complications: Secondary | ICD-10-CM

## 2021-06-29 DIAGNOSIS — I10 Essential (primary) hypertension: Secondary | ICD-10-CM

## 2021-06-29 MED ORDER — CARVEDILOL 25 MG PO TABS
ORAL_TABLET | Freq: Two times a day (BID) | ORAL | 11 refills | Status: DC
  Filled 2021-06-29: qty 60, 30d supply, fill #0
  Filled 2021-08-03: qty 60, 30d supply, fill #1
  Filled 2021-09-04: qty 60, 30d supply, fill #2
  Filled 2021-10-04: qty 60, 30d supply, fill #3
  Filled 2021-11-06: qty 60, 30d supply, fill #4
  Filled 2021-12-07: qty 60, 30d supply, fill #5
  Filled 2022-01-14: qty 60, 30d supply, fill #0
  Filled 2022-02-26: qty 60, 30d supply, fill #1
  Filled 2022-04-08: qty 60, 30d supply, fill #2
  Filled 2022-05-14: qty 60, 30d supply, fill #3
  Filled 2022-06-28: qty 60, 30d supply, fill #4

## 2021-06-29 MED ORDER — LANTUS SOLOSTAR 100 UNIT/ML ~~LOC~~ SOPN
30.0000 [IU] | PEN_INJECTOR | Freq: Every day | SUBCUTANEOUS | 11 refills | Status: DC
  Filled 2021-06-29: qty 9, 30d supply, fill #0
  Filled 2021-08-03: qty 9, 30d supply, fill #1
  Filled 2021-09-04: qty 21, 70d supply, fill #2
  Filled 2021-11-06: qty 21, 70d supply, fill #3
  Filled 2022-01-14: qty 9, 30d supply, fill #0
  Filled 2022-02-26: qty 9, 30d supply, fill #1

## 2021-06-29 MED ORDER — AMLODIPINE BESYLATE 10 MG PO TABS
ORAL_TABLET | ORAL | 11 refills | Status: DC
  Filled 2021-06-29: qty 15, 30d supply, fill #0
  Filled 2021-08-03: qty 15, 30d supply, fill #1
  Filled 2021-09-04: qty 15, 30d supply, fill #2
  Filled 2021-10-04: qty 15, 30d supply, fill #3
  Filled 2021-11-06: qty 15, 30d supply, fill #4
  Filled 2021-12-07: qty 15, 30d supply, fill #5
  Filled 2022-01-14: qty 15, 30d supply, fill #0
  Filled 2022-02-26: qty 15, 30d supply, fill #1
  Filled 2022-04-08: qty 15, 30d supply, fill #2
  Filled 2022-05-14: qty 15, 30d supply, fill #3

## 2021-06-29 MED ORDER — GLIPIZIDE 10 MG PO TABS
ORAL_TABLET | Freq: Two times a day (BID) | ORAL | 11 refills | Status: DC
  Filled 2021-06-29: qty 60, 30d supply, fill #0
  Filled 2021-08-03: qty 60, 30d supply, fill #1
  Filled 2021-09-04: qty 60, 30d supply, fill #2
  Filled 2021-10-04: qty 60, 30d supply, fill #3
  Filled 2021-11-06: qty 60, 30d supply, fill #4
  Filled 2021-12-07: qty 60, 30d supply, fill #5
  Filled 2022-01-14: qty 60, 30d supply, fill #0
  Filled 2022-02-26: qty 60, 30d supply, fill #1
  Filled 2022-04-08: qty 60, 30d supply, fill #2
  Filled 2022-05-14: qty 60, 30d supply, fill #3
  Filled 2022-06-28: qty 60, 30d supply, fill #4

## 2021-06-29 MED ORDER — METFORMIN HCL 1000 MG PO TABS
ORAL_TABLET | Freq: Two times a day (BID) | ORAL | 11 refills | Status: DC
  Filled 2021-06-29: qty 60, 30d supply, fill #0
  Filled 2021-08-03: qty 60, 30d supply, fill #1
  Filled 2021-08-08 – 2021-09-04 (×2): qty 60, 30d supply, fill #2
  Filled 2021-10-04: qty 60, 30d supply, fill #3
  Filled 2021-11-06: qty 60, 30d supply, fill #4
  Filled 2021-12-07: qty 60, 30d supply, fill #5
  Filled 2022-01-14: qty 60, 30d supply, fill #0
  Filled 2022-02-26: qty 60, 30d supply, fill #1
  Filled 2022-04-08: qty 60, 30d supply, fill #2
  Filled 2022-05-14: qty 60, 30d supply, fill #3
  Filled 2022-06-28: qty 60, 30d supply, fill #4

## 2021-06-29 MED ORDER — HUMULIN 70/30 (70-30) 100 UNIT/ML ~~LOC~~ SUSP
SUBCUTANEOUS | 11 refills | Status: DC
  Filled 2021-06-29: qty 10, 28d supply, fill #0
  Filled 2021-08-03: qty 10, 28d supply, fill #1
  Filled 2021-09-04: qty 10, 28d supply, fill #2
  Filled 2021-10-04: qty 10, 28d supply, fill #3
  Filled 2021-11-06: qty 10, 28d supply, fill #4
  Filled 2021-12-07: qty 10, 28d supply, fill #5
  Filled 2022-01-14: qty 10, 28d supply, fill #0
  Filled 2022-02-26: qty 10, 28d supply, fill #1
  Filled 2022-04-08: qty 10, 28d supply, fill #2
  Filled 2022-05-14: qty 10, 28d supply, fill #3
  Filled 2022-06-28: qty 10, 28d supply, fill #4

## 2021-06-29 MED ORDER — GABAPENTIN 100 MG PO CAPS
ORAL_CAPSULE | Freq: Two times a day (BID) | ORAL | 11 refills | Status: DC
  Filled 2021-06-29: qty 60, 30d supply, fill #0
  Filled 2021-08-03: qty 60, 30d supply, fill #1
  Filled 2021-09-04: qty 60, 30d supply, fill #2
  Filled 2021-10-04: qty 60, 30d supply, fill #3
  Filled 2021-11-06: qty 60, 30d supply, fill #4
  Filled 2021-12-07: qty 60, 30d supply, fill #5
  Filled 2022-01-14: qty 60, 30d supply, fill #0
  Filled 2022-02-26: qty 60, 30d supply, fill #1
  Filled 2022-04-08: qty 60, 30d supply, fill #2
  Filled 2022-05-14: qty 60, 30d supply, fill #3
  Filled 2022-06-28: qty 60, 30d supply, fill #4

## 2021-06-29 MED ORDER — FUROSEMIDE 40 MG PO TABS
ORAL_TABLET | Freq: Every day | ORAL | 11 refills | Status: DC
  Filled 2021-06-29: qty 30, 30d supply, fill #0
  Filled 2021-08-03: qty 30, 30d supply, fill #1
  Filled 2021-09-04: qty 30, 30d supply, fill #2
  Filled 2021-10-04: qty 30, 30d supply, fill #3
  Filled 2021-11-06: qty 30, 30d supply, fill #4
  Filled 2021-12-07: qty 30, 30d supply, fill #5
  Filled 2022-01-14: qty 30, 30d supply, fill #0
  Filled 2022-02-26: qty 30, 30d supply, fill #1
  Filled 2022-04-08: qty 30, 30d supply, fill #2
  Filled 2022-05-14: qty 30, 30d supply, fill #3
  Filled 2022-06-28: qty 30, 30d supply, fill #4

## 2021-06-29 MED ORDER — GEMFIBROZIL 600 MG PO TABS
ORAL_TABLET | ORAL | 11 refills | Status: DC
Start: 2021-06-29 — End: 2022-10-14
  Filled 2021-06-29: qty 60, 30d supply, fill #0
  Filled 2021-08-03: qty 60, 30d supply, fill #1
  Filled 2021-09-04: qty 60, 30d supply, fill #2
  Filled 2021-10-04: qty 60, 30d supply, fill #3
  Filled 2021-11-06: qty 60, 30d supply, fill #4
  Filled 2021-12-07: qty 60, 30d supply, fill #5
  Filled 2022-01-14 (×2): qty 60, 30d supply, fill #0
  Filled 2022-02-26: qty 60, 30d supply, fill #1
  Filled 2022-04-08: qty 60, 30d supply, fill #2
  Filled 2022-05-14: qty 60, 30d supply, fill #3
  Filled 2022-06-28: qty 60, 30d supply, fill #4

## 2021-06-29 MED ORDER — SPIRONOLACTONE 25 MG PO TABS
ORAL_TABLET | Freq: Every day | ORAL | 11 refills | Status: DC
  Filled 2021-06-29: qty 30, 30d supply, fill #0
  Filled 2021-08-03: qty 30, 30d supply, fill #1
  Filled 2021-09-04: qty 30, 30d supply, fill #2
  Filled 2021-10-04: qty 30, 30d supply, fill #3
  Filled 2021-11-06: qty 30, 30d supply, fill #4
  Filled 2021-12-07: qty 30, 30d supply, fill #5
  Filled 2022-01-14: qty 30, 30d supply, fill #0
  Filled 2022-02-26: qty 30, 30d supply, fill #1
  Filled 2022-04-08: qty 30, 30d supply, fill #2
  Filled 2022-05-14: qty 30, 30d supply, fill #3
  Filled 2022-06-28: qty 30, 30d supply, fill #4

## 2021-07-03 ENCOUNTER — Other Ambulatory Visit: Payer: Self-pay

## 2021-07-23 ENCOUNTER — Other Ambulatory Visit: Payer: Self-pay

## 2021-08-03 ENCOUNTER — Other Ambulatory Visit: Payer: Self-pay

## 2021-08-08 ENCOUNTER — Other Ambulatory Visit: Payer: Self-pay

## 2021-08-31 ENCOUNTER — Other Ambulatory Visit: Payer: Self-pay

## 2021-08-31 ENCOUNTER — Ambulatory Visit (INDEPENDENT_AMBULATORY_CARE_PROVIDER_SITE_OTHER): Payer: Self-pay | Admitting: Nurse Practitioner

## 2021-08-31 ENCOUNTER — Encounter: Payer: Self-pay | Admitting: Nurse Practitioner

## 2021-08-31 ENCOUNTER — Ambulatory Visit: Payer: Self-pay | Admitting: Nurse Practitioner

## 2021-08-31 VITALS — BP 166/79 | HR 73 | Temp 97.6°F | Ht 73.0 in | Wt 252.0 lb

## 2021-08-31 DIAGNOSIS — Z1211 Encounter for screening for malignant neoplasm of colon: Secondary | ICD-10-CM

## 2021-08-31 DIAGNOSIS — Z0000 Encounter for general adult medical examination without abnormal findings: Secondary | ICD-10-CM

## 2021-08-31 DIAGNOSIS — E7849 Other hyperlipidemia: Secondary | ICD-10-CM

## 2021-08-31 DIAGNOSIS — J3089 Other allergic rhinitis: Secondary | ICD-10-CM

## 2021-08-31 DIAGNOSIS — E118 Type 2 diabetes mellitus with unspecified complications: Secondary | ICD-10-CM

## 2021-08-31 DIAGNOSIS — Z125 Encounter for screening for malignant neoplasm of prostate: Secondary | ICD-10-CM

## 2021-08-31 LAB — POCT URINALYSIS DIPSTICK
Bilirubin, UA: NEGATIVE
Glucose, UA: NEGATIVE
Ketones, UA: NEGATIVE
Leukocytes, UA: NEGATIVE
Nitrite, UA: NEGATIVE
Protein, UA: POSITIVE — AB
Spec Grav, UA: >=1.03 — AB (ref 1.010–1.025)
Urobilinogen, UA: 0.2 E.U./dL
pH, UA: 5.5 (ref 5.0–8.0)

## 2021-08-31 NOTE — Patient Instructions (Signed)
You were seen today in the [facility] for follow up on chronic illness. Labs were collected, results will be available via MyChart or, if abnormal, you will be contacted by clinic staff. Please continue taking medications as prescribed. Please follow up in 6 mths for reevaluation.
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
## 2021-09-01 LAB — CBC WITH DIFFERENTIAL/PLATELET
Basophils Absolute: 0 10*3/uL (ref 0.0–0.2)
Basos: 0 %
EOS (ABSOLUTE): 0.2 10*3/uL (ref 0.0–0.4)
Eos: 3 %
Hematocrit: 34.6 % — ABNORMAL LOW (ref 37.5–51.0)
Hemoglobin: 11.7 g/dL — ABNORMAL LOW (ref 13.0–17.7)
Immature Grans (Abs): 0 10*3/uL (ref 0.0–0.1)
Immature Granulocytes: 0 %
Lymphocytes Absolute: 1 10*3/uL (ref 0.7–3.1)
Lymphs: 16 %
MCH: 29 pg (ref 26.6–33.0)
MCHC: 33.8 g/dL (ref 31.5–35.7)
MCV: 86 fL (ref 79–97)
Monocytes Absolute: 0.6 10*3/uL (ref 0.1–0.9)
Monocytes: 9 %
Neutrophils Absolute: 4.3 10*3/uL (ref 1.4–7.0)
Neutrophils: 72 %
Platelets: 325 10*3/uL (ref 150–450)
RBC: 4.04 x10E6/uL — ABNORMAL LOW (ref 4.14–5.80)
RDW: 12.6 % (ref 11.6–15.4)
WBC: 6.1 10*3/uL (ref 3.4–10.8)

## 2021-09-01 LAB — COMPREHENSIVE METABOLIC PANEL
ALT: 8 IU/L (ref 0–44)
AST: 21 IU/L (ref 0–40)
Albumin/Globulin Ratio: 1.8 (ref 1.2–2.2)
Albumin: 4.6 g/dL (ref 3.8–4.9)
Alkaline Phosphatase: 93 IU/L (ref 44–121)
BUN/Creatinine Ratio: 19 (ref 9–20)
BUN: 23 mg/dL (ref 6–24)
Bilirubin Total: 0.3 mg/dL (ref 0.0–1.2)
CO2: 19 mmol/L — ABNORMAL LOW (ref 20–29)
Calcium: 10 mg/dL (ref 8.7–10.2)
Chloride: 101 mmol/L (ref 96–106)
Creatinine, Ser: 1.22 mg/dL (ref 0.76–1.27)
Globulin, Total: 2.5 g/dL (ref 1.5–4.5)
Glucose: 110 mg/dL — ABNORMAL HIGH (ref 65–99)
Potassium: 4.6 mmol/L (ref 3.5–5.2)
Sodium: 138 mmol/L (ref 134–144)
Total Protein: 7.1 g/dL (ref 6.0–8.5)
eGFR: 71 mL/min/{1.73_m2} (ref >59)

## 2021-09-01 LAB — LIPID PANEL
Chol/HDL Ratio: 5.6 ratio — ABNORMAL HIGH (ref 0.0–5.0)
Cholesterol, Total: 145 mg/dL (ref 100–199)
HDL: 26 mg/dL — ABNORMAL LOW (ref >39)
LDL Chol Calc (NIH): 84 mg/dL (ref 0–99)
Triglycerides: 203 mg/dL — ABNORMAL HIGH (ref 0–149)
VLDL Cholesterol Cal: 35 mg/dL (ref 5–40)

## 2021-09-01 LAB — HEMOGLOBIN A1C
Est. average glucose Bld gHb Est-mCnc: 174 mg/dL
Hgb A1c MFr Bld: 7.7 % — ABNORMAL HIGH (ref 4.8–5.6)

## 2021-09-01 LAB — PSA: Prostate Specific Ag, Serum: 0.3 ng/mL (ref 0.0–4.0)

## 2021-09-01 LAB — HIV ANTIBODY (ROUTINE TESTING W REFLEX): HIV Screen 4th Generation wRfx: NONREACTIVE

## 2021-09-01 LAB — MICROALBUMIN, URINE: Microalbumin, Urine: 1745.1 ug/mL

## 2021-09-04 ENCOUNTER — Other Ambulatory Visit: Payer: Self-pay

## 2021-09-05 ENCOUNTER — Other Ambulatory Visit: Payer: Self-pay

## 2021-10-04 ENCOUNTER — Other Ambulatory Visit: Payer: Self-pay

## 2021-10-05 ENCOUNTER — Other Ambulatory Visit: Payer: Self-pay

## 2021-10-25 ENCOUNTER — Other Ambulatory Visit: Payer: Self-pay

## 2021-10-31 ENCOUNTER — Other Ambulatory Visit: Payer: Self-pay

## 2021-11-06 ENCOUNTER — Other Ambulatory Visit: Payer: Self-pay

## 2021-12-07 ENCOUNTER — Other Ambulatory Visit: Payer: Self-pay

## 2022-01-14 ENCOUNTER — Other Ambulatory Visit: Payer: Self-pay

## 2022-02-26 ENCOUNTER — Other Ambulatory Visit: Payer: Self-pay

## 2022-03-01 ENCOUNTER — Other Ambulatory Visit: Payer: Self-pay

## 2022-03-01 ENCOUNTER — Encounter: Payer: Self-pay | Admitting: Nurse Practitioner

## 2022-03-01 ENCOUNTER — Ambulatory Visit (INDEPENDENT_AMBULATORY_CARE_PROVIDER_SITE_OTHER): Payer: Self-pay | Admitting: Nurse Practitioner

## 2022-03-01 VITALS — BP 179/90 | HR 76 | Temp 97.7°F | Wt 256.0 lb

## 2022-03-01 DIAGNOSIS — E7849 Other hyperlipidemia: Secondary | ICD-10-CM

## 2022-03-01 DIAGNOSIS — J069 Acute upper respiratory infection, unspecified: Secondary | ICD-10-CM

## 2022-03-01 DIAGNOSIS — J020 Streptococcal pharyngitis: Secondary | ICD-10-CM

## 2022-03-01 DIAGNOSIS — I10 Essential (primary) hypertension: Secondary | ICD-10-CM

## 2022-03-01 DIAGNOSIS — R7309 Other abnormal glucose: Secondary | ICD-10-CM

## 2022-03-01 DIAGNOSIS — E118 Type 2 diabetes mellitus with unspecified complications: Secondary | ICD-10-CM

## 2022-03-01 LAB — POCT GLYCOSYLATED HEMOGLOBIN (HGB A1C)
HbA1c POC (<> result, manual entry): 8 % (ref 4.0–5.6)
HbA1c, POC (controlled diabetic range): 8 % — AB (ref 0.0–7.0)
HbA1c, POC (prediabetic range): 8 % — AB (ref 5.7–6.4)
Hemoglobin A1C: 8 % — AB (ref 4.0–5.6)

## 2022-03-01 LAB — POCT RAPID STREP A (OFFICE): Rapid Strep A Screen: POSITIVE — AB

## 2022-03-01 LAB — POCT INFLUENZA A/B
Influenza A, POC: NEGATIVE
Influenza B, POC: NEGATIVE

## 2022-03-01 MED ORDER — LOSARTAN POTASSIUM 25 MG PO TABS
25.0000 mg | ORAL_TABLET | Freq: Every day | ORAL | 2 refills | Status: DC
  Filled 2022-03-01: qty 30, 30d supply, fill #0

## 2022-03-01 MED ORDER — AMOXICILLIN-POT CLAVULANATE 875-125 MG PO TABS
1.0000 | ORAL_TABLET | Freq: Two times a day (BID) | ORAL | 0 refills | Status: DC
  Filled 2022-03-01: qty 20, 10d supply, fill #0

## 2022-03-01 MED ORDER — ALBUTEROL SULFATE HFA 108 (90 BASE) MCG/ACT IN AERS
2.0000 | INHALATION_SPRAY | Freq: Four times a day (QID) | RESPIRATORY_TRACT | 0 refills | Status: DC | PRN
  Filled 2022-03-01: qty 18, 25d supply, fill #0

## 2022-03-01 MED ORDER — LANTUS SOLOSTAR 100 UNIT/ML ~~LOC~~ SOPN
50.0000 [IU] | PEN_INJECTOR | Freq: Every day | SUBCUTANEOUS | 11 refills | Status: DC
  Filled 2022-03-01: qty 15, 30d supply, fill #0
  Filled 2022-04-08: qty 9, 18d supply, fill #0
  Filled 2022-05-14: qty 9, 18d supply, fill #1
  Filled 2022-06-28: qty 15, 30d supply, fill #2
  Filled 2022-08-14: qty 15, 30d supply, fill #3

## 2022-03-01 MED ORDER — ALBUTEROL SULFATE HFA 108 (90 BASE) MCG/ACT IN AERS
INHALATION_SPRAY | RESPIRATORY_TRACT | 0 refills | Status: DC
  Filled 2022-03-01: qty 18, 25d supply, fill #0

## 2022-03-01 MED ORDER — PREDNISONE 20 MG PO TABS
20.0000 mg | ORAL_TABLET | Freq: Every day | ORAL | 0 refills | Status: AC
  Filled 2022-03-01: qty 5, 5d supply, fill #0

## 2022-03-01 NOTE — Addendum Note (Signed)
Addended by: Schuyler Amor on: 03/01/2022 11:56 AM ? ? Modules accepted: Orders ? ?
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
## 2022-03-01 NOTE — Patient Instructions (Signed)
You were seen today in the [facility] for upper respiratory infection and chronic illness. Labs were collected, results will be available via MyChart or, if abnormal, you will be contacted by clinic staff. You were prescribed medications, please take as directed. Please follow up in 3 mths for reevaluation of diabetes, hypertension and hyperlipidemia.  ?

## 2022-03-02 LAB — LIPID PANEL
Chol/HDL Ratio: 6.2 ratio — ABNORMAL HIGH (ref 0.0–5.0)
Cholesterol, Total: 168 mg/dL (ref 100–199)
HDL: 27 mg/dL — ABNORMAL LOW (ref >39)
LDL Chol Calc (NIH): 87 mg/dL (ref 0–99)
Triglycerides: 329 mg/dL — ABNORMAL HIGH (ref 0–149)
VLDL Cholesterol Cal: 54 mg/dL — ABNORMAL HIGH (ref 5–40)

## 2022-03-05 ENCOUNTER — Other Ambulatory Visit: Payer: Self-pay | Admitting: Nurse Practitioner

## 2022-03-05 ENCOUNTER — Other Ambulatory Visit: Payer: Self-pay

## 2022-03-05 DIAGNOSIS — E7849 Other hyperlipidemia: Secondary | ICD-10-CM

## 2022-03-05 MED ORDER — OMEGA-3-ACID ETHYL ESTERS 1 G PO CAPS
2.0000 g | ORAL_CAPSULE | Freq: Every day | ORAL | 2 refills | Status: DC
  Filled 2022-03-05: qty 60, 30d supply, fill #0

## 2022-03-12 ENCOUNTER — Other Ambulatory Visit: Payer: Self-pay

## 2022-03-19 ENCOUNTER — Encounter (HOSPITAL_COMMUNITY): Payer: Self-pay | Admitting: Emergency Medicine

## 2022-03-19 ENCOUNTER — Other Ambulatory Visit: Payer: Self-pay

## 2022-03-19 ENCOUNTER — Emergency Department (HOSPITAL_COMMUNITY)
Admission: EM | Admit: 2022-03-19 | Discharge: 2022-03-19 | Disposition: A | Discharge status: Home / Self Care | Payer: Self-pay | Attending: Emergency Medicine | Admitting: Emergency Medicine

## 2022-03-19 ENCOUNTER — Emergency Department (HOSPITAL_COMMUNITY): Payer: Self-pay

## 2022-03-19 DIAGNOSIS — Z794 Long term (current) use of insulin: Secondary | ICD-10-CM | POA: Insufficient documentation

## 2022-03-19 DIAGNOSIS — R531 Weakness: Secondary | ICD-10-CM | POA: Insufficient documentation

## 2022-03-19 DIAGNOSIS — I509 Heart failure, unspecified: Secondary | ICD-10-CM | POA: Insufficient documentation

## 2022-03-19 DIAGNOSIS — R42 Dizziness and giddiness: Secondary | ICD-10-CM | POA: Insufficient documentation

## 2022-03-19 DIAGNOSIS — Z7982 Long term (current) use of aspirin: Secondary | ICD-10-CM | POA: Insufficient documentation

## 2022-03-19 DIAGNOSIS — Z7984 Long term (current) use of oral hypoglycemic drugs: Secondary | ICD-10-CM | POA: Insufficient documentation

## 2022-03-19 DIAGNOSIS — I110 Hypertensive heart disease with heart failure: Secondary | ICD-10-CM | POA: Insufficient documentation

## 2022-03-19 DIAGNOSIS — E119 Type 2 diabetes mellitus without complications: Secondary | ICD-10-CM | POA: Insufficient documentation

## 2022-03-19 DIAGNOSIS — D72829 Elevated white blood cell count, unspecified: Secondary | ICD-10-CM | POA: Insufficient documentation

## 2022-03-19 DIAGNOSIS — R55 Syncope and collapse: Secondary | ICD-10-CM | POA: Insufficient documentation

## 2022-03-19 DIAGNOSIS — Z79899 Other long term (current) drug therapy: Secondary | ICD-10-CM | POA: Insufficient documentation

## 2022-03-19 LAB — COMPREHENSIVE METABOLIC PANEL
ALT: 11 U/L (ref 0–44)
AST: 20 U/L (ref 15–41)
Albumin: 4.2 g/dL (ref 3.5–5.0)
Alkaline Phosphatase: 70 U/L (ref 38–126)
Anion gap: 8 (ref 5–15)
BUN: 25 mg/dL — ABNORMAL HIGH (ref 6–20)
CO2: 26 mmol/L (ref 22–32)
Calcium: 9.6 mg/dL (ref 8.9–10.3)
Chloride: 102 mmol/L (ref 98–111)
Creatinine, Ser: 1.1 mg/dL (ref 0.61–1.24)
GFR, Estimated: >60 mL/min (ref >60)
Glucose, Bld: 236 mg/dL — ABNORMAL HIGH (ref 70–99)
Potassium: 5 mmol/L (ref 3.5–5.1)
Sodium: 136 mmol/L (ref 135–145)
Total Bilirubin: 0.4 mg/dL (ref 0.3–1.2)
Total Protein: 8 g/dL (ref 6.5–8.1)

## 2022-03-19 LAB — CBC WITH DIFFERENTIAL/PLATELET
Abs Immature Granulocytes: 0.1 10*3/uL — ABNORMAL HIGH (ref 0.00–0.07)
Basophils Absolute: 0.1 10*3/uL (ref 0.0–0.1)
Basophils Relative: 1 %
Eosinophils Absolute: 0.1 10*3/uL (ref 0.0–0.5)
Eosinophils Relative: 1 %
HCT: 36.5 % — ABNORMAL LOW (ref 39.0–52.0)
Hemoglobin: 12.4 g/dL — ABNORMAL LOW (ref 13.0–17.0)
Immature Granulocytes: 1 %
Lymphocytes Relative: 14 %
Lymphs Abs: 1.5 10*3/uL (ref 0.7–4.0)
MCH: 29.6 pg (ref 26.0–34.0)
MCHC: 34 g/dL (ref 30.0–36.0)
MCV: 87.1 fL (ref 80.0–100.0)
Monocytes Absolute: 0.4 10*3/uL (ref 0.1–1.0)
Monocytes Relative: 4 %
Neutro Abs: 8.6 10*3/uL — ABNORMAL HIGH (ref 1.7–7.7)
Neutrophils Relative %: 79 %
Platelets: 213 10*3/uL (ref 150–400)
RBC: 4.19 MIL/uL — ABNORMAL LOW (ref 4.22–5.81)
RDW: 12.6 % (ref 11.5–15.5)
WBC: 10.8 10*3/uL — ABNORMAL HIGH (ref 4.0–10.5)
nRBC: 0 % (ref 0.0–0.2)

## 2022-03-19 MED ORDER — LACTATED RINGERS IV BOLUS
500.0000 mL | Freq: Once | INTRAVENOUS | Status: AC
  Administered 2022-03-19: 500 mL via INTRAVENOUS

## 2022-03-19 NOTE — ED Notes (Signed)
Attempted to start IV with no success at this time ? ?

## 2022-03-19 NOTE — ED Notes (Signed)
Ambulated with pt down the hallway, back to room. Pt tolerated well. No c/o dizziness, N/V, pain, or weakness. SpO2 98% RA ?

## 2022-03-19 NOTE — ED Triage Notes (Signed)
Pt BIB EMS from workplace. While attempting bowel movement pt vagal down and nearly fainted. He assisted himeself on the ground with no injury. Alert and oriented x4, Diaphoretic and dizzy. Denies chest pain, N/V, or SOB.  ? ?BP 138/82 ?P 60 ?spO2 98% ?CBG 251 ?

## 2022-03-19 NOTE — Discharge Instructions (Signed)
All the blood work today looked normal.  However if this starts happening more frequently you need to call your doctor and medications may need to be adjusted.  If you get any chest pain, palpitations, shortness of breath you should return to the emergency room. ?

## 2022-03-19 NOTE — ED Provider Notes (Signed)
?[facility] DEPT ?Provider Note ? ? ?CSN: [id number] ?Arrival date & time: 03/19/22  1049 ? ?  ? ?History ? ?Chief Complaint  ?Patient presents with  ? Near Syncope  ? ? ?Jared Hart is a 56 y.o. male. ? ?Patient is a 56 year old male with a history of diabetes, CHF with last echo in 2017 with an EF of 40 to 45%, hypertension, hypercholesterolemia, stroke and recent strep throat completing antibiotics last week presenting today from work after a syncopal event.  Patient reports this morning when he woke up it was a normal day, when he got to work he felt fine however he went to the bathroom and had a bowel movement and while sitting on the toilet he started to feel very sweaty and lightheaded.  He denies any straining and reports it was a normal stool however he caught himself falling to the floor and laid down.  He reports symptoms lasted approximately 30 minutes before resolution.  At no time did you feel short of breath, chest palpitations or chest pain.  He denies any nausea vomiting or diarrhea.  No prior history of syncope.  Currently he reports just feeling tired and a little bit lightheaded.  He recently had an additional blood pressure medication added but reports over the last few weeks since it started he had not had lightheadedness with standing, low blood pressure reads or other concerns.  He denies cough, fever or nasal congestion recently.  He does take his diuretic daily but denies any recent new swelling or concerns for fluid overload. ? ?The history is provided by the patient and medical records.  ?Near Syncope ? ? ?  ? ?Home Medications ?Prior to Admission medications   ?Medication Sig Start Date End Date Taking? Authorizing Provider  ?albuterol (VENTOLIN HFA) 108 (90 Base) MCG/ACT inhaler Inhale 2 puffs by mouth every 6hrs as needed for cough and shortness of breath ?Patient taking differently: Inhale 2 puffs into the lungs every 6 (six) hours as needed for wheezing  or shortness of breath. 03/01/22  Yes Passmore, Tewana I, NP  ?amLODipine (NORVASC) 10 MG tablet TAKE 1/2 TABLET (5 MG TOTAL) BY MOUTH DAILY. ?Patient taking differently: Take 5 mg by mouth in the morning. 06/29/21 06/29/22 Yes Vevelyn Francois, NP  ?aspirin 81 MG EC tablet Take 1 tablet (81 mg total) by mouth daily. 01/06/19  Yes Azzie Glatter, FNP  ?carvedilol (COREG) 25 MG tablet TAKE 1 TABLET (25 MG TOTAL) BY MOUTH 2 (TWO) TIMES DAILY WITH A MEAL. ?Patient taking differently: Take 25 mg by mouth in the morning and at bedtime. 06/29/21 06/29/22 Yes Vevelyn Francois, NP  ?Cinnamon 500 MG TABS Take 500 mg by mouth 2 (two) times daily.   Yes [provider]  ?docusate sodium (COLACE) 100 MG capsule Take 1 capsule (100 mg total) by mouth daily. ?Patient taking differently: Take 200 mg by mouth 2 (two) times daily. 01/06/19  Yes Azzie Glatter, FNP  ?furosemide (LASIX) 40 MG tablet TAKE 1 TABLET (40 MG TOTAL) BY MOUTH DAILY. ?Patient taking differently: Take 40 mg by mouth daily. 06/29/21 06/29/22 Yes King, Diona Foley, NP  ?gabapentin (NEURONTIN) 100 MG capsule TAKE 1 CAPSULE (100 MG TOTAL) BY MOUTH 2 (TWO) TIMES DAILY. ?Patient taking differently: Take 100 mg by mouth 2 (two) times daily. 06/29/21 06/29/22 Yes King, Diona Foley, NP  ?gemfibrozil (LOPID) 600 MG tablet TAKE 1 TABLET (600 MG TOTAL) BY MOUTH 2 (TWO) TIMES DAILY BEFORE A MEAL.  06/29/21 06/29/22 Yes King, Diona Foley, NP  ?glipiZIDE (GLUCOTROL) 10 MG tablet TAKE 1 TABLET (10 MG TOTAL) BY MOUTH 2 (TWO) TIMES DAILY BEFORE A MEAL. ?Patient taking differently: Take 10 mg by mouth 2 (two) times daily. 06/29/21 06/29/22 Yes Vevelyn Francois, NP  ?ibuprofen (ADVIL) 200 MG tablet Take 400 mg by mouth every 6 (six) hours as needed for headache or mild pain.   Yes [provider]  ?insulin glargine (LANTUS SOLOSTAR) 100 UNIT/ML Solostar Pen Inject 50 Units into the skin at bedtime. 03/01/22  Yes Passmore, Jake Church I, NP  ?insulin NPH-regular Human (HUMULIN 70/30) (70-30) 100 UNIT/ML  injection INJECT 10 UNITS INTO THE SKIN 2 TIMES DAILY WITH A MEAL. ?Patient taking differently: 20 Units 2 (two) times daily with a meal. 06/29/21 06/29/22 Yes King, Diona Foley, NP  ?losartan (COZAAR) 25 MG tablet Take 1 tablet (25 mg total) by mouth daily. 03/01/22 05/30/22 Yes Passmore, Jake Church I, NP  ?metFORMIN (GLUCOPHAGE) 1000 MG tablet TAKE 1 TABLET (1,000 MG TOTAL) BY MOUTH 2 (TWO) TIMES DAILY WITH A MEAL. ?Patient taking differently: Take 1,000 mg by mouth 2 (two) times daily with a meal. 06/29/21 06/29/22 Yes Vevelyn Francois, NP  ?Multiple Vitamin (MULTIVITAMIN WITH MINERALS) TABS tablet Take 1 tablet by mouth daily with breakfast.   Yes [provider]  ?polyethylene glycol powder (GLYCOLAX/MIRALAX) 17 GM/SCOOP powder Take 17 g by mouth daily. ?Patient taking differently: Take 17 g by mouth daily as needed for mild constipation (if not relieved by stool softeners). 06/26/20  Yes Azzie Glatter, FNP  ?spironolactone (ALDACTONE) 25 MG tablet TAKE 1 TABLET (25 MG TOTAL) BY MOUTH DAILY. ?Patient taking differently: Take 25 mg by mouth daily. 06/29/21 06/29/22 Yes Vevelyn Francois, NP  ?amoxicillin-clavulanate (AUGMENTIN) 875-125 MG tablet Take 1 tablet by mouth 2 (two) times daily. ?Patient not taking: Reported on 03/19/2022 03/01/22   Bo Merino I, NP  ?BD VEO INSULIN SYR ULTRAFINE 31G X 15/64" 0.5 ML MISC USE AS DIRECTED. 12/02/17   Scot Jun, FNP  ?blood glucose meter kit and supplies Dispense based on patient and insurance preference. Use up to four times daily as directed. (FOR ICD-10 E10.9, E11.9). 05/17/19   Azzie Glatter, FNP  ?Insulin Pen Needle (ULTICARE MINI PEN NEEDLES) 31G X 6 MM MISC Use as directed 12/03/17   Scot Jun, FNP  ?Insulin Syringe-Needle U-100 (INSULIN SYRINGE .5CC/31GX5/16") 31G X 5/16" 0.5 ML MISC Use as per directions. 10/15/16   Micheline Chapman, NP  ?omega-3 acid ethyl esters (LOVAZA) 1 g capsule Take 2 capsules (2 g total) by mouth daily. ?Patient not taking:  Reported on 03/19/2022 03/05/22 06/03/22  Bo Merino I, NP  ?   ? ?Allergies    ?Patient has no known allergies.   ? ?Review of Systems   ?Review of Systems  ?Cardiovascular:  Positive for near-syncope.  ? ?Physical Exam ?Updated Vital Signs ?BP (!) 159/83   Pulse 66   Temp (!) 97.4 ?F (36.3 ?C) (Oral)   Resp 16   Ht 6' 1"  (1.854 m)   Wt 113.4 kg   SpO2 100%   BMI 32.98 kg/m?  ?Physical Exam ?Vitals and nursing note reviewed.  ?Constitutional:   ?   General: He is not in acute distress. ?   Appearance: He is well-developed.  ?HENT:  ?   Head: Normocephalic and atraumatic.  ?   Right Ear: Tympanic membrane normal.  ?   Left Ear: Tympanic membrane normal.  ?  Mouth/Throat:  ?   Mouth: Mucous membranes are moist.  ?   Comments: Mouthful of chewing tobacco ?Eyes:  ?   Conjunctiva/sclera: Conjunctivae normal.  ?   Pupils: Pupils are equal, round, and reactive to light.  ?Cardiovascular:  ?   Rate and Rhythm: Normal rate and regular rhythm.  ?   Pulses: Normal pulses.  ?   Heart sounds: No murmur heard. ?Pulmonary:  ?   Effort: Pulmonary effort is normal. No respiratory distress.  ?   Breath sounds: Normal breath sounds. No wheezing or rales.  ?Abdominal:  ?   General: There is no distension.  ?   Palpations: Abdomen is soft.  ?   Tenderness: There is no abdominal tenderness. There is no guarding or rebound.  ?Musculoskeletal:     ?   General: No tenderness. Normal range of motion.  ?   Cervical back: Normal range of motion and neck supple.  ?Skin: ?   General: Skin is warm and dry.  ?   Coloration: Skin is pale.  ?   Findings: No erythema or rash.  ?Neurological:  ?   Mental Status: He is alert and oriented to person, place, and time. Mental status is at baseline.  ?Psychiatric:     ?   Mood and Affect: Mood normal.     ?   Behavior: Behavior normal.  ? ? ?ED Results / Procedures / Treatments   ?Labs ?(all labs ordered are listed, but only abnormal results are displayed) ?Labs Reviewed  ?CBC WITH  DIFFERENTIAL/PLATELET - Abnormal; Notable for the following components:  ?    Result Value  ? WBC 10.8 (*)   ? RBC 4.19 (*)   ? Hemoglobin 12.4 (*)   ? HCT 36.5 (*)   ? Neutro Abs 8.6 (*)   ? Abs Immature Granulocytes 0.10

## 2022-03-27 ENCOUNTER — Telehealth: Payer: Self-pay | Admitting: Nurse Practitioner

## 2022-03-27 NOTE — Telephone Encounter (Signed)
Patient left VM on the nurse line on 3/28 regarding light-headedness, dizziness after changing meds. Was in ED last week after passing out. Would you like him to be seen? ?

## 2022-03-29 ENCOUNTER — Ambulatory Visit (INDEPENDENT_AMBULATORY_CARE_PROVIDER_SITE_OTHER): Payer: Self-pay | Admitting: Nurse Practitioner

## 2022-03-29 ENCOUNTER — Encounter: Payer: Self-pay | Admitting: Nurse Practitioner

## 2022-03-29 VITALS — BP 140/82 | HR 80 | Temp 97.5°F | Ht 73.0 in | Wt 251.2 lb

## 2022-03-29 DIAGNOSIS — E1165 Type 2 diabetes mellitus with hyperglycemia: Secondary | ICD-10-CM

## 2022-03-29 DIAGNOSIS — Z09 Encounter for follow-up examination after completed treatment for conditions other than malignant neoplasm: Secondary | ICD-10-CM

## 2022-03-29 NOTE — Patient Instructions (Signed)
You were seen today in the [facility] for hospital follow up . Labs were collected, results will be available via MyChart or, if abnormal, you will be contacted by clinic staff. You were prescribed medications, please take as directed. Please maintain follow up in June ?
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
## 2022-03-30 LAB — COMPREHENSIVE METABOLIC PANEL
ALT: 11 IU/L (ref 0–44)
AST: 18 IU/L (ref 0–40)
Albumin/Globulin Ratio: 1.8 (ref 1.2–2.2)
Albumin: 4.6 g/dL (ref 3.8–4.9)
Alkaline Phosphatase: 116 IU/L (ref 44–121)
BUN/Creatinine Ratio: 17 (ref 9–20)
BUN: 20 mg/dL (ref 6–24)
Bilirubin Total: 0.2 mg/dL (ref 0.0–1.2)
CO2: 21 mmol/L (ref 20–29)
Calcium: 9.9 mg/dL (ref 8.7–10.2)
Chloride: 99 mmol/L (ref 96–106)
Creatinine, Ser: 1.18 mg/dL (ref 0.76–1.27)
Globulin, Total: 2.6 g/dL (ref 1.5–4.5)
Glucose: 172 mg/dL — ABNORMAL HIGH (ref 70–99)
Potassium: 4.9 mmol/L (ref 3.5–5.2)
Sodium: 139 mmol/L (ref 134–144)
Total Protein: 7.2 g/dL (ref 6.0–8.5)
eGFR: 74 mL/min/{1.73_m2} (ref >59)

## 2022-03-30 LAB — CBC WITH DIFFERENTIAL/PLATELET
Basophils Absolute: 0 10*3/uL (ref 0.0–0.2)
Basos: 0 %
EOS (ABSOLUTE): 0.2 10*3/uL (ref 0.0–0.4)
Eos: 3 %
Hematocrit: 34.6 % — ABNORMAL LOW (ref 37.5–51.0)
Hemoglobin: 11.5 g/dL — ABNORMAL LOW (ref 13.0–17.7)
Immature Grans (Abs): 0 10*3/uL (ref 0.0–0.1)
Immature Granulocytes: 1 %
Lymphocytes Absolute: 2.2 10*3/uL (ref 0.7–3.1)
Lymphs: 32 %
MCH: 28.5 pg (ref 26.6–33.0)
MCHC: 33.2 g/dL (ref 31.5–35.7)
MCV: 86 fL (ref 79–97)
Monocytes Absolute: 0.6 10*3/uL (ref 0.1–0.9)
Monocytes: 9 %
Neutrophils Absolute: 3.8 10*3/uL (ref 1.4–7.0)
Neutrophils: 55 %
Platelets: 378 10*3/uL (ref 150–450)
RBC: 4.03 x10E6/uL — ABNORMAL LOW (ref 4.14–5.80)
RDW: 13.1 % (ref 11.6–15.4)
WBC: 6.8 10*3/uL (ref 3.4–10.8)

## 2022-04-08 ENCOUNTER — Other Ambulatory Visit: Payer: Self-pay

## 2022-04-10 ENCOUNTER — Other Ambulatory Visit: Payer: Self-pay

## 2022-05-03 ENCOUNTER — Other Ambulatory Visit: Payer: Self-pay

## 2022-05-06 ENCOUNTER — Other Ambulatory Visit: Payer: Self-pay

## 2022-05-14 ENCOUNTER — Other Ambulatory Visit: Payer: Self-pay

## 2022-05-14 ENCOUNTER — Other Ambulatory Visit: Payer: Self-pay | Admitting: Family Medicine

## 2022-05-14 MED ORDER — "INSULIN SYRINGE-NEEDLE U-100 31G X 5/16"" 0.5 ML MISC"
0 refills | Status: DC
  Filled 2022-05-14: qty 100, 25d supply, fill #0

## 2022-05-30 DIAGNOSIS — R059 Cough, unspecified: Secondary | ICD-10-CM | POA: Diagnosis not present

## 2022-05-30 DIAGNOSIS — J9811 Atelectasis: Secondary | ICD-10-CM | POA: Diagnosis not present

## 2022-06-03 ENCOUNTER — Encounter: Payer: Self-pay | Admitting: Nurse Practitioner

## 2022-06-03 ENCOUNTER — Other Ambulatory Visit: Payer: Self-pay

## 2022-06-03 ENCOUNTER — Inpatient Hospital Stay: Payer: Self-pay | Admitting: Nurse Practitioner

## 2022-06-03 ENCOUNTER — Ambulatory Visit (INDEPENDENT_AMBULATORY_CARE_PROVIDER_SITE_OTHER): Payer: Self-pay | Admitting: Nurse Practitioner

## 2022-06-03 VITALS — BP 141/83 | HR 71 | Temp 97.9°F | Ht 73.0 in | Wt 245.6 lb

## 2022-06-03 DIAGNOSIS — G459 Transient cerebral ischemic attack, unspecified: Secondary | ICD-10-CM

## 2022-06-03 DIAGNOSIS — E7849 Other hyperlipidemia: Secondary | ICD-10-CM

## 2022-06-03 DIAGNOSIS — R55 Syncope and collapse: Secondary | ICD-10-CM

## 2022-06-03 DIAGNOSIS — I10 Essential (primary) hypertension: Secondary | ICD-10-CM

## 2022-06-03 DIAGNOSIS — K5900 Constipation, unspecified: Secondary | ICD-10-CM

## 2022-06-03 DIAGNOSIS — E1165 Type 2 diabetes mellitus with hyperglycemia: Secondary | ICD-10-CM

## 2022-06-03 LAB — CBC WITH DIFFERENTIAL/PLATELET
Abs Immature Granulocytes: 0.08 10*3/uL — ABNORMAL HIGH (ref 0.00–0.07)
Basophils Absolute: 0.1 10*3/uL (ref 0.0–0.1)
Basophils Relative: 1 %
Eosinophils Absolute: 0.3 10*3/uL (ref 0.0–0.5)
Eosinophils Relative: 4 %
HCT: 33.9 % — ABNORMAL LOW (ref 39.0–52.0)
Hemoglobin: 11.7 g/dL — ABNORMAL LOW (ref 13.0–17.0)
Immature Granulocytes: 1 %
Lymphocytes Relative: 37 %
Lymphs Abs: 2.6 10*3/uL (ref 0.7–4.0)
MCH: 29.5 pg (ref 26.0–34.0)
MCHC: 34.5 g/dL (ref 30.0–36.0)
MCV: 85.4 fL (ref 80.0–100.0)
Monocytes Absolute: 0.5 10*3/uL (ref 0.1–1.0)
Monocytes Relative: 7 %
Neutro Abs: 3.6 10*3/uL (ref 1.7–7.7)
Neutrophils Relative %: 50 %
Platelets: 390 10*3/uL (ref 150–400)
RBC: 3.97 MIL/uL — ABNORMAL LOW (ref 4.22–5.81)
RDW: 12.8 % (ref 11.5–15.5)
WBC: 7.1 10*3/uL (ref 4.0–10.5)
nRBC: 0 % (ref 0.0–0.2)

## 2022-06-03 LAB — POCT GLYCOSYLATED HEMOGLOBIN (HGB A1C)
HbA1c POC (<> result, manual entry): 8 % (ref 4.0–5.6)
HbA1c, POC (controlled diabetic range): 8 % — AB (ref 0.0–7.0)
HbA1c, POC (prediabetic range): 8 % — AB (ref 5.7–6.4)
Hemoglobin A1C: 8 % — AB (ref 4.0–5.6)

## 2022-06-03 MED ORDER — AMLODIPINE BESYLATE 10 MG PO TABS
10.0000 mg | ORAL_TABLET | Freq: Every day | ORAL | 3 refills | Status: DC
  Filled 2022-06-03: qty 90, 90d supply, fill #0
  Filled 2022-06-28: qty 30, 30d supply, fill #0
  Filled 2022-08-14: qty 30, 30d supply, fill #1

## 2022-06-03 MED ORDER — CLOPIDOGREL BISULFATE 75 MG PO TABS
75.0000 mg | ORAL_TABLET | Freq: Every day | ORAL | 0 refills | Status: DC
  Filled 2022-06-03: qty 21, 21d supply, fill #0

## 2022-06-03 MED ORDER — BLOOD PRESSURE KIT
1.0000 | PACK | Freq: Every day | 0 refills | Status: DC
  Filled 2022-06-03: qty 1, fill #0
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
## 2022-06-03 NOTE — Patient Instructions (Signed)
You were seen today in the [facility] for hospital follow up. Labs were collected, results will be available via MyChart or, if abnormal, you will be contacted by clinic staff. You were prescribed medications, please take as directed. Please follow up in 2 wks for reevaluation.

## 2022-06-04 LAB — LIPID PANEL
Chol/HDL Ratio: 6.4 ratio — ABNORMAL HIGH (ref 0.0–5.0)
Cholesterol, Total: 135 mg/dL (ref 100–199)
HDL: 21 mg/dL — ABNORMAL LOW (ref >39)
LDL Chol Calc (NIH): 62 mg/dL (ref 0–99)
Triglycerides: 327 mg/dL — ABNORMAL HIGH (ref 0–149)
VLDL Cholesterol Cal: 52 mg/dL — ABNORMAL HIGH (ref 5–40)

## 2022-06-04 LAB — COMPREHENSIVE METABOLIC PANEL
ALT: 5 IU/L (ref 0–44)
AST: 15 IU/L (ref 0–40)
Albumin/Globulin Ratio: 1.6 (ref 1.2–2.2)
Albumin: 4.6 g/dL (ref 3.8–4.9)
Alkaline Phosphatase: 103 IU/L (ref 44–121)
BUN/Creatinine Ratio: 16 (ref 9–20)
BUN: 20 mg/dL (ref 6–24)
Bilirubin Total: 0.2 mg/dL (ref 0.0–1.2)
CO2: 21 mmol/L (ref 20–29)
Calcium: 10.2 mg/dL (ref 8.7–10.2)
Chloride: 100 mmol/L (ref 96–106)
Creatinine, Ser: 1.29 mg/dL — ABNORMAL HIGH (ref 0.76–1.27)
Globulin, Total: 2.8 g/dL (ref 1.5–4.5)
Glucose: 174 mg/dL — ABNORMAL HIGH (ref 70–99)
Potassium: 4.5 mmol/L (ref 3.5–5.2)
Sodium: 140 mmol/L (ref 134–144)
Total Protein: 7.4 g/dL (ref 6.0–8.5)
eGFR: 66 mL/min/{1.73_m2} (ref >59)

## 2022-06-06 ENCOUNTER — Encounter: Payer: Self-pay | Admitting: Neurology

## 2022-06-06 ENCOUNTER — Other Ambulatory Visit: Payer: Self-pay

## 2022-06-06 ENCOUNTER — Ambulatory Visit: Payer: Self-pay | Admitting: Neurology

## 2022-06-06 VITALS — BP 127/84 | HR 67 | Ht 73.0 in | Wt 246.0 lb

## 2022-06-06 DIAGNOSIS — R251 Tremor, unspecified: Secondary | ICD-10-CM | POA: Insufficient documentation

## 2022-06-06 DIAGNOSIS — Z85038 Personal history of other malignant neoplasm of large intestine: Secondary | ICD-10-CM | POA: Insufficient documentation

## 2022-06-06 DIAGNOSIS — I63231 Cerebral infarction due to unspecified occlusion or stenosis of right carotid arteries: Secondary | ICD-10-CM

## 2022-06-06 MED ORDER — LEVETIRACETAM 500 MG PO TABS
500.0000 mg | ORAL_TABLET | Freq: Two times a day (BID) | ORAL | 11 refills | Status: DC
  Filled 2022-06-06: qty 60, 30d supply, fill #0
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
## 2022-06-09 ENCOUNTER — Telehealth: Payer: Self-pay | Admitting: Neurology

## 2022-06-09 NOTE — Telephone Encounter (Addendum)
Please move up his follow-up appointment with me in around August 2023, also advised him to bring MRIs from [facility] system for me to review during the follow-up visit

## 2022-06-10 ENCOUNTER — Telehealth: Payer: Self-pay | Admitting: Neurology

## 2022-06-10 ENCOUNTER — Ambulatory Visit (INDEPENDENT_AMBULATORY_CARE_PROVIDER_SITE_OTHER): Payer: Self-pay | Admitting: Neurology

## 2022-06-10 DIAGNOSIS — R55 Syncope and collapse: Secondary | ICD-10-CM

## 2022-06-10 DIAGNOSIS — I63231 Cerebral infarction due to unspecified occlusion or stenosis of right carotid arteries: Secondary | ICD-10-CM

## 2022-06-10 NOTE — Telephone Encounter (Signed)
I was able to get in touch with the patient. He just started taking the levetiracetam 4 days ago. Informed him these side effects tend to dissipate once his body becomes acclimated to the medication. Feels he is able to give it a try for longer. The side effects are tolerable. If he is not feeling better by next week, he was instructed to call back. If symptoms worsen, then he will call us sooner.

## 2022-06-10 NOTE — Telephone Encounter (Signed)
Pt came in for EEG today and wanted to let us know that the [place] he started has been making him drowsy and dizzy. Would like a call from RN to see if he is able to take something else at [phone].

## 2022-06-10 NOTE — Telephone Encounter (Signed)
Patient was seen in the office 06/06/22. Placed on levetiracetam '500mg'$ , one tab BID for possible partial seizures. Hx of CVA. Completed EEG today.   Attempted to call patient back twice since he left message. No answer and voicemail is full.

## 2022-06-17 ENCOUNTER — Telehealth: Payer: Self-pay | Admitting: Neurology

## 2022-06-17 ENCOUNTER — Encounter: Payer: Self-pay | Admitting: Nurse Practitioner

## 2022-06-17 ENCOUNTER — Ambulatory Visit (INDEPENDENT_AMBULATORY_CARE_PROVIDER_SITE_OTHER): Payer: Self-pay | Admitting: Nurse Practitioner

## 2022-06-17 DIAGNOSIS — Z1211 Encounter for screening for malignant neoplasm of colon: Secondary | ICD-10-CM

## 2022-06-17 DIAGNOSIS — I63231 Cerebral infarction due to unspecified occlusion or stenosis of right carotid arteries: Secondary | ICD-10-CM

## 2022-06-17 DIAGNOSIS — E1165 Type 2 diabetes mellitus with hyperglycemia: Secondary | ICD-10-CM

## 2022-06-17 DIAGNOSIS — I10 Essential (primary) hypertension: Secondary | ICD-10-CM
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
## 2022-06-17 NOTE — Telephone Encounter (Signed)
Pt is wanting nurse to call with EEG results soon as they come in.

## 2022-06-18 NOTE — Procedures (Signed)
   HISTORY: History of stroke, intermittent confusion, possible partial seizure  TECHNIQUE:  This is a routine 16 channel EEG recording with one channel devoted to a limited EKG recording.  It was performed during wakefulness, drowsiness and asleep.  Hyperventilation and photic stimulation were performed as activating procedures.  There are minimum muscle and movement artifact noted.  Upon maximum arousal, posterior dominant waking rhythm consistent of rhythmic alpha range activity,  Activities are symmetric over the bilateral posterior derivations and attenuated with eye opening.  Hyperventilation produced mild/moderate buildup with higher amplitude and the slower activities noted.  Photic stimulation did not alter the tracing.  During EEG recording, patient developed drowsiness and deeper stage of sleep was achieved During EEG recording, there was no epileptiform discharge noted.    EKG demonstrate sinus rhythm, with heart rate of 64 beats per minutes  CONCLUSION: This is a  normal awake EEG.  There is no electrodiagnostic evidence of epileptiform discharge.  Marcial Pacas, M.D. Ph.D.  [facility] [place], [place] [address] Phone: [phone] Fax:      [phone]

## 2022-06-19 NOTE — Telephone Encounter (Signed)
Please call patient EEG was normal

## 2022-06-20 NOTE — Telephone Encounter (Signed)
The patient and his wife called back. They are aware of his normal EEG.   He is still not tolerating levetiracetam '500mg'$ , one tab BID. They wanted to talk about the need to continue this medication.  They also had a lot of questions concerning his MRI results and history of right internal carotid artery high-grade stenosis, extensive without ischemia of the right frontocentral semiovale and basal ganglion.  They wanted to speak to Dr. Krista Blue more about his long term plan.  Mychart visit scheduled 06/24/22.

## 2022-06-20 NOTE — Telephone Encounter (Signed)
Left patient a detailed message, with results, on voicemail (ok per DPR).  Provided our number to call back with any questions.  

## 2022-06-24 ENCOUNTER — Telehealth: Payer: Self-pay | Admitting: Neurology

## 2022-06-24 ENCOUNTER — Other Ambulatory Visit: Payer: Self-pay

## 2022-06-24 ENCOUNTER — Ambulatory Visit: Payer: Self-pay | Admitting: Neurology

## 2022-06-24 ENCOUNTER — Encounter: Payer: Self-pay | Admitting: Neurology

## 2022-06-24 ENCOUNTER — Telehealth (INDEPENDENT_AMBULATORY_CARE_PROVIDER_SITE_OTHER): Payer: Self-pay | Admitting: Neurology

## 2022-06-24 DIAGNOSIS — G459 Transient cerebral ischemic attack, unspecified: Secondary | ICD-10-CM

## 2022-06-24 DIAGNOSIS — I63231 Cerebral infarction due to unspecified occlusion or stenosis of right carotid arteries: Secondary | ICD-10-CM

## 2022-06-24 MED ORDER — CLOPIDOGREL BISULFATE 75 MG PO TABS
75.0000 mg | ORAL_TABLET | Freq: Every day | ORAL | 3 refills | Status: DC
  Filled 2022-06-24: qty 30, 30d supply, fill #0
  Filled 2022-08-14: qty 30, 30d supply, fill #1

## 2022-06-24 NOTE — Telephone Encounter (Signed)
I spoke to the patient's wife. There was a problem with his appt scheduled today. He did not no show is morning's follow up. Mychart rescheduled for later today.

## 2022-06-25 ENCOUNTER — Other Ambulatory Visit: Payer: Self-pay

## 2022-06-25 NOTE — Telephone Encounter (Signed)
Phone rep called pt to offer office visit slot that shows available for this afternoon.  No one available, unable to leave vm, vm is full.

## 2022-06-28 ENCOUNTER — Other Ambulatory Visit: Payer: Self-pay

## 2022-07-24 ENCOUNTER — Other Ambulatory Visit: Payer: Self-pay

## 2022-08-13 ENCOUNTER — Ambulatory Visit: Payer: Self-pay | Admitting: Neurology

## 2022-08-14 ENCOUNTER — Telehealth: Payer: Self-pay

## 2022-08-14 ENCOUNTER — Other Ambulatory Visit: Payer: Self-pay

## 2022-08-14 ENCOUNTER — Other Ambulatory Visit: Payer: Self-pay | Admitting: Nurse Practitioner

## 2022-08-14 DIAGNOSIS — G8929 Other chronic pain: Secondary | ICD-10-CM

## 2022-08-14 DIAGNOSIS — I10 Essential (primary) hypertension: Secondary | ICD-10-CM

## 2022-08-14 DIAGNOSIS — E118 Type 2 diabetes mellitus with unspecified complications: Secondary | ICD-10-CM

## 2022-08-14 DIAGNOSIS — R7309 Other abnormal glucose: Secondary | ICD-10-CM

## 2022-08-14 DIAGNOSIS — G459 Transient cerebral ischemic attack, unspecified: Secondary | ICD-10-CM

## 2022-08-14 MED ORDER — FUROSEMIDE 40 MG PO TABS
ORAL_TABLET | Freq: Every day | ORAL | 2 refills | Status: DC
  Filled 2022-09-12: qty 30, 30d supply, fill #0
  Filled 2022-10-17: qty 30, 30d supply, fill #1
  Filled 2022-11-13: qty 30, 30d supply, fill #2

## 2022-08-14 MED ORDER — SPIRONOLACTONE 25 MG PO TABS
ORAL_TABLET | Freq: Every day | ORAL | 2 refills | Status: DC
  Filled 2022-09-12: qty 30, 30d supply, fill #0
  Filled 2022-10-17: qty 30, 30d supply, fill #1
  Filled 2022-11-13: qty 30, 30d supply, fill #2

## 2022-08-14 MED ORDER — FUROSEMIDE 40 MG PO TABS
ORAL_TABLET | Freq: Every day | ORAL | 2 refills | Status: DC
  Filled 2022-08-14: qty 30, 30d supply, fill #0

## 2022-08-14 MED ORDER — GLIPIZIDE 10 MG PO TABS
ORAL_TABLET | Freq: Two times a day (BID) | ORAL | 2 refills | Status: DC
  Filled 2022-08-14: qty 60, 30d supply, fill #0

## 2022-08-14 MED ORDER — LANTUS SOLOSTAR 100 UNIT/ML ~~LOC~~ SOPN: 50.0000 [IU] | PEN_INJECTOR | Freq: Every day | SUBCUTANEOUS | 11 refills | Status: DC

## 2022-08-14 MED ORDER — GABAPENTIN 100 MG PO CAPS
ORAL_CAPSULE | Freq: Two times a day (BID) | ORAL | 2 refills | Status: DC
  Filled 2022-08-14: qty 60, 30d supply, fill #0

## 2022-08-14 MED ORDER — METFORMIN HCL 1000 MG PO TABS
ORAL_TABLET | Freq: Two times a day (BID) | ORAL | 2 refills | Status: DC
  Filled 2022-08-14: qty 60, 30d supply, fill #0

## 2022-08-14 MED ORDER — METFORMIN HCL 1000 MG PO TABS: ORAL_TABLET | Freq: Two times a day (BID) | ORAL | 2 refills | Status: DC

## 2022-08-14 MED ORDER — HUMULIN 70/30 (70-30) 100 UNIT/ML ~~LOC~~ SUSP
SUBCUTANEOUS | 2 refills | Status: DC
  Filled 2022-08-14: qty 10, 31d supply, fill #0

## 2022-08-14 MED ORDER — SPIRONOLACTONE 25 MG PO TABS
ORAL_TABLET | Freq: Every day | ORAL | 2 refills | Status: DC
  Filled 2022-08-14: qty 30, 30d supply, fill #0

## 2022-08-14 MED ORDER — GLIPIZIDE 10 MG PO TABS: ORAL_TABLET | Freq: Two times a day (BID) | ORAL | 2 refills | Status: DC

## 2022-08-14 MED ORDER — AMLODIPINE BESYLATE 10 MG PO TABS: 10.0000 mg | ORAL_TABLET | Freq: Every day | ORAL | 3 refills | Status: DC

## 2022-08-14 MED ORDER — CARVEDILOL 25 MG PO TABS: ORAL_TABLET | Freq: Two times a day (BID) | ORAL | 2 refills | Status: DC

## 2022-08-14 MED ORDER — CARVEDILOL 25 MG PO TABS
ORAL_TABLET | Freq: Two times a day (BID) | ORAL | 2 refills | Status: DC
  Filled 2022-08-14: qty 60, 30d supply, fill #0

## 2022-08-14 MED ORDER — CLOPIDOGREL BISULFATE 75 MG PO TABS
75.0000 mg | ORAL_TABLET | Freq: Every day | ORAL | 3 refills | Status: AC
  Filled 2022-09-12: qty 90, 90d supply, fill #0

## 2022-08-14 NOTE — Telephone Encounter (Signed)
done

## 2022-08-14 NOTE — Telephone Encounter (Signed)
Lantus Metformin Gabapentin Plavix Glipizide Carvedilol Amlodipine Furosemide Spironolactone Fluid pills

## 2022-08-14 NOTE — Telephone Encounter (Signed)
No additional notes needed  

## 2022-08-15 ENCOUNTER — Other Ambulatory Visit: Payer: Self-pay

## 2022-08-15 MED ORDER — GABAPENTIN 100 MG PO CAPS: ORAL_CAPSULE | Freq: Two times a day (BID) | ORAL | 2 refills | Status: DC

## 2022-09-11 ENCOUNTER — Other Ambulatory Visit: Payer: Self-pay

## 2022-09-11 ENCOUNTER — Ambulatory Visit (INDEPENDENT_AMBULATORY_CARE_PROVIDER_SITE_OTHER): Payer: Self-pay | Admitting: Nurse Practitioner

## 2022-09-11 ENCOUNTER — Encounter: Payer: Self-pay | Admitting: Nurse Practitioner

## 2022-09-11 DIAGNOSIS — M79672 Pain in left foot: Secondary | ICD-10-CM

## 2022-09-11 DIAGNOSIS — I10 Essential (primary) hypertension: Secondary | ICD-10-CM

## 2022-09-11 DIAGNOSIS — K5900 Constipation, unspecified: Secondary | ICD-10-CM

## 2022-09-11 DIAGNOSIS — E7849 Other hyperlipidemia: Secondary | ICD-10-CM

## 2022-09-11 DIAGNOSIS — G8929 Other chronic pain: Secondary | ICD-10-CM

## 2022-09-11 DIAGNOSIS — R7309 Other abnormal glucose: Secondary | ICD-10-CM

## 2022-09-11 DIAGNOSIS — E118 Type 2 diabetes mellitus with unspecified complications: Secondary | ICD-10-CM

## 2022-09-11 MED ORDER — LANTUS SOLOSTAR 100 UNIT/ML ~~LOC~~ SOPN
50.0000 [IU] | PEN_INJECTOR | Freq: Every day | SUBCUTANEOUS | 11 refills | Status: DC
  Filled 2022-09-11: qty 15, 30d supply, fill #0
  Filled 2022-10-17: qty 15, 30d supply, fill #1

## 2022-09-11 MED ORDER — GABAPENTIN 100 MG PO CAPS
ORAL_CAPSULE | Freq: Two times a day (BID) | ORAL | 2 refills | Status: DC
  Filled 2022-09-11: qty 60, 30d supply, fill #0
  Filled 2022-10-17: qty 60, 30d supply, fill #1
  Filled 2022-11-13: qty 60, 30d supply, fill #2

## 2022-09-11 MED ORDER — GLIPIZIDE 10 MG PO TABS
ORAL_TABLET | Freq: Two times a day (BID) | ORAL | 2 refills | Status: DC
  Filled 2022-09-11: qty 180, 90d supply, fill #0

## 2022-09-11 MED ORDER — DSS 100 MG PO CAPS
1.0000 | ORAL_CAPSULE | Freq: Every day | ORAL | 2 refills | Status: DC
  Filled 2022-09-11: qty 60, fill #0

## 2022-09-11 MED ORDER — AMLODIPINE BESYLATE 10 MG PO TABS
10.0000 mg | ORAL_TABLET | Freq: Every day | ORAL | 3 refills | Status: DC
  Filled 2022-09-11: qty 90, 90d supply, fill #0
  Filled 2022-12-25: qty 90, 90d supply, fill #1
  Filled 2023-03-26: qty 90, 90d supply, fill #2
  Filled 2023-06-20: qty 90, 90d supply, fill #3

## 2022-09-11 MED ORDER — DOCUSATE SODIUM 100 MG PO CAPS
100.0000 mg | ORAL_CAPSULE | Freq: Every day | ORAL | 6 refills | Status: DC
  Filled 2022-09-11: qty 30, 30d supply, fill #0

## 2022-09-11 MED ORDER — CARVEDILOL 25 MG PO TABS
ORAL_TABLET | Freq: Two times a day (BID) | ORAL | 2 refills | Status: DC
  Filled 2022-09-11: qty 180, 90d supply, fill #0

## 2022-09-11 MED ORDER — METFORMIN HCL 1000 MG PO TABS
ORAL_TABLET | Freq: Two times a day (BID) | ORAL | 2 refills | Status: DC
  Filled 2022-09-11: qty 180, 90d supply, fill #0

## 2022-09-11 MED ORDER — HUMULIN 70/30 (70-30) 100 UNIT/ML ~~LOC~~ SUSP
SUBCUTANEOUS | 2 refills | Status: DC
  Filled 2022-09-11: qty 10, 50d supply, fill #0

## 2022-09-11 NOTE — Assessment & Plan Note (Signed)
-   insulin NPH-regular Human (HUMULIN 70/30) (70-30) 100 UNIT/ML injection; INJECT 10 UNITS INTO THE SKIN 2 TIMES DAILY WITH A MEAL.  Dispense: 10 mL; Refill: 2 - glipiZIDE (GLUCOTROL) 10 MG tablet; TAKE 1 TABLET (10 MG TOTAL) BY MOUTH 2 (TWO) TIMES DAILY BEFORE A MEAL.  Dispense: 60 tablet; Refill: 2 - metFORMIN (GLUCOPHAGE) 1000 MG tablet; TAKE 1 TABLET (1,000 MG TOTAL) BY MOUTH 2 (TWO) TIMES DAILY WITH A MEAL.  Dispense: 60 tablet; Refill: 2 - insulin glargine (LANTUS SOLOSTAR) 100 UNIT/ML Solostar Pen; Inject 50 Units into the skin at bedtime.  Dispense: 15 mL; Refill: 11  2. Essential hypertension  - amLODipine (NORVASC) 10 MG tablet; Take 1 tablet (10 mg total) by mouth daily.  Dispense: 90 tablet; Refill: 3 - carvedilol (COREG) 25 MG tablet; TAKE 1 TABLET (25 MG TOTAL) BY MOUTH 2 (TWO) TIMES DAILY WITH A MEAL.  Dispense: 60 tablet; Refill: 2  3. Constipation, unspecified constipation type  - docusate sodium (COLACE) 100 MG capsule; Take 1 capsule (100 mg total) by mouth daily.  Dispense: 30 capsule; Refill: 6 - Docusate Sodium (DSS) 100 MG CAPS; Take 1 capsule by mouth daily. Taking 1 tablet by mouth twice daily  Dispense: 60 capsule; Refill: 2  4. Other hyperlipidemia   5. Heel pain, chronic, left  - gabapentin (NEURONTIN) 100 MG capsule; TAKE 1 CAPSULE (100 MG TOTAL) BY MOUTH 2 (TWO) TIMES DAILY.  Dispense: 60 capsule; Refill: 2  6. Elevated glucose  - glipiZIDE (GLUCOTROL) 10 MG tablet; TAKE 1 TABLET (10 MG TOTAL) BY MOUTH 2 (TWO) TIMES DAILY BEFORE A MEAL.  Dispense: 60 tablet; Refill: 2 - insulin glargine (LANTUS SOLOSTAR) 100 UNIT/ML Solostar Pen; Inject 50 Units into the skin at bedtime.  Dispense: 15 mL; Refill: 11  Follow up:  Follow up in 3 months or sooner if needed

## 2022-09-11 NOTE — Patient Instructions (Signed)
1. Type 2 diabetes mellitus with complication, without long-term current use of insulin (HCC)  - insulin NPH-regular Human (HUMULIN 70/30) (70-30) 100 UNIT/ML injection; INJECT 10 UNITS INTO THE SKIN 2 TIMES DAILY WITH A MEAL.  Dispense: 10 mL; Refill: 2 - glipiZIDE (GLUCOTROL) 10 MG tablet; TAKE 1 TABLET (10 MG TOTAL) BY MOUTH 2 (TWO) TIMES DAILY BEFORE A MEAL.  Dispense: 60 tablet; Refill: 2 - metFORMIN (GLUCOPHAGE) 1000 MG tablet; TAKE 1 TABLET (1,000 MG TOTAL) BY MOUTH 2 (TWO) TIMES DAILY WITH A MEAL.  Dispense: 60 tablet; Refill: 2 - insulin glargine (LANTUS SOLOSTAR) 100 UNIT/ML Solostar Pen; Inject 50 Units into the skin at bedtime.  Dispense: 15 mL; Refill: 11  2. Essential hypertension  - amLODipine (NORVASC) 10 MG tablet; Take 1 tablet (10 mg total) by mouth daily.  Dispense: 90 tablet; Refill: 3 - carvedilol (COREG) 25 MG tablet; TAKE 1 TABLET (25 MG TOTAL) BY MOUTH 2 (TWO) TIMES DAILY WITH A MEAL.  Dispense: 60 tablet; Refill: 2  3. Constipation, unspecified constipation type  - docusate sodium (COLACE) 100 MG capsule; Take 1 capsule (100 mg total) by mouth daily.  Dispense: 30 capsule; Refill: 6 - Docusate Sodium (DSS) 100 MG CAPS; Take 1 capsule by mouth daily. Taking 1 tablet by mouth twice daily  Dispense: 60 capsule; Refill: 2  4. Other hyperlipidemia   5. Heel pain, chronic, left  - gabapentin (NEURONTIN) 100 MG capsule; TAKE 1 CAPSULE (100 MG TOTAL) BY MOUTH 2 (TWO) TIMES DAILY.  Dispense: 60 capsule; Refill: 2  6. Elevated glucose  - glipiZIDE (GLUCOTROL) 10 MG tablet; TAKE 1 TABLET (10 MG TOTAL) BY MOUTH 2 (TWO) TIMES DAILY BEFORE A MEAL.  Dispense: 60 tablet; Refill: 2 - insulin glargine (LANTUS SOLOSTAR) 100 UNIT/ML Solostar Pen; Inject 50 Units into the skin at bedtime.  Dispense: 15 mL; Refill: 11  Follow up:  Follow up in 3 months or sooner if needed
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
## 2022-09-12 ENCOUNTER — Other Ambulatory Visit: Payer: Self-pay

## 2022-09-12 ENCOUNTER — Telehealth: Payer: Self-pay | Admitting: Pharmacist

## 2022-09-12 NOTE — Progress Notes (Unsigned)
Contacted patient regarding referral for diabetes, medication access, and medication management from Fenton Foy, NP .   Left patient a voicemail to return my call at their [place], PharmD, [facility] [phone]

## 2022-09-13 ENCOUNTER — Other Ambulatory Visit: Payer: Self-pay

## 2022-09-18 NOTE — Progress Notes (Signed)
Contacted patient regarding referral for hypertension and diabetes from Fenton Foy, NP .   Appointment scheduled   Catie Hedwig Morton, PharmD, [facility] [phone]

## 2022-09-18 NOTE — Progress Notes (Signed)
Contacted patient regarding referral for diabetes, medication access, and medication management from Fenton Foy, NP .   Left patient a voicemail to return my call at their [place], PharmD, [facility] [phone]

## 2022-09-24 DIAGNOSIS — Z0271 Encounter for disability determination: Secondary | ICD-10-CM

## 2022-10-14 ENCOUNTER — Other Ambulatory Visit: Payer: Self-pay | Admitting: Pharmacist

## 2022-10-14 ENCOUNTER — Other Ambulatory Visit: Payer: Self-pay

## 2022-10-14 DIAGNOSIS — Z794 Long term (current) use of insulin: Secondary | ICD-10-CM

## 2022-10-14 DIAGNOSIS — I10 Essential (primary) hypertension: Secondary | ICD-10-CM

## 2022-10-14 DIAGNOSIS — I6320 Cerebral infarction due to unspecified occlusion or stenosis of unspecified precerebral arteries: Secondary | ICD-10-CM

## 2022-10-14 MED ORDER — METFORMIN HCL ER 500 MG PO TB24
500.0000 mg | ORAL_TABLET | Freq: Two times a day (BID) | ORAL | 1 refills | Status: DC
  Filled 2022-10-14: qty 180, 90d supply, fill #0

## 2022-10-14 MED ORDER — ATORVASTATIN CALCIUM 80 MG PO TABS
80.0000 mg | ORAL_TABLET | Freq: Every day | ORAL | 1 refills | Status: DC
  Filled 2022-10-14: qty 90, 90d supply, fill #0
  Filled 2022-12-25: qty 90, 90d supply, fill #1

## 2022-10-14 MED ORDER — CARVEDILOL 25 MG PO TABS
25.0000 mg | ORAL_TABLET | Freq: Two times a day (BID) | ORAL | 1 refills | Status: DC
  Filled 2022-10-14 – 2022-12-25 (×2): qty 180, 90d supply, fill #0
  Filled 2023-03-26: qty 180, 90d supply, fill #1

## 2022-10-14 NOTE — Progress Notes (Unsigned)
10/14/2022 Name: DELOY ARCHEY MRN: [id number] DOB: 04-14-1966  Chief Complaint  Patient presents with   Medication Management   Hypertension   Diabetes   Hyperlipidemia    Jared Hart is a 56 y.o. year old male who presented for a telephone visit.   They were referred to the pharmacist by their PCP for assistance in managing diabetes.    Subjective:  Care Team: Primary Care Provider: Fenton Foy, NP ; Next Scheduled Visit: 12/12/22 Cardiology: [facility]): Next Scheduled Visit: 10/29/22 Upcoming procedure with neurosurgery on 10/29/22  Medication Access/Adherence  Current Pharmacy:  [facility], [address] [place] [address] Phone: [phone] Fax: [phone]   Patient reports affordability concerns with their medications: No  Patient reports access/transportation concerns to their pharmacy: No  Patient reports adherence concerns with their medications:  No     Diabetes:  Current medications: Lantus 30 units daily, insulin 70/30 18 units twice daily, metformin 1000 mg twice daily, glipizide 10 mg twice daily  Current glucose readings: reports he and his wife have been sleeping in lately, so few fasting readings. Reports post prandial readings can range from 130-200s  Does report some occasional readings <70  Heart Failure:  Current medications:  ACEi/ARB/ARNI: none, consider moving forward SGLT2i: none, consider moving forward Beta blocker: carvedilol 25 mg twice daily Mineralocorticoid Receptor Antagonist: spironolactone 25 mg twice daily Diuretic regimen: furosemide 40 mg daily   Hyperlipidemia/ASCVD Risk Reduction  Current lipid lowering medications: prescribed atorvastatin 80 mg but not taking  Antiplatelet regimen: clopidogrel 75 mg daily    Health Maintenance  Health Maintenance Due  Topic Date Due   Hepatitis C Screening  Never done   Zoster Vaccines- Shingrix  (1 of 2) Never done   COLONOSCOPY (Pts 45-21yr Insurance coverage will need to be confirmed)  Never done   COVID-19 Vaccine (3 - Pfizer risk series) 08/16/2020   Diabetic kidney evaluation - Urine ACR  06/26/2021   OPHTHALMOLOGY EXAM  06/06/2022   INFLUENZA VACCINE  07/30/2022   FOOT EXAM  08/31/2022     Objective: Lab Results  Component Value Date   HGBA1C 8.0 (A) 06/03/2022   HGBA1C 8.0 06/03/2022   HGBA1C 8.0 (A) 06/03/2022   HGBA1C 8.0 (A) 06/03/2022    Lab Results  Component Value Date   CREATININE 1.29 (H) 06/03/2022   BUN 20 06/03/2022   NA 140 06/03/2022   K 4.5 06/03/2022   CL 100 06/03/2022   CO2 21 06/03/2022    Lab Results  Component Value Date   CHOL 135 06/03/2022   HDL 21 (L) 06/03/2022   LDLCALC 62 06/03/2022   LDLDIRECT 81.4 11/01/2013   TRIG 327 (H) 06/03/2022   CHOLHDL 6.4 (H) 06/03/2022    Medications Reviewed Today     Reviewed by HOsker Mason RPH-CPP (Pharmacist) on 10/14/22 at 1411  Med List Status: <None>   Medication Order Taking? Sig Documenting Provider Last Dose Status Informant  albuterol (VENTOLIN HFA) 108 (90 Base) MCG/ACT inhaler [id number] Inhale 2 puffs by mouth every 6hrs as needed for cough and shortness of breath  Patient taking differently: Inhale 2 puffs into the lungs every 6 (six) hours as needed for wheezing or shortness of breath.   PBo MerinoI, NP  Active Self  amLODipine (NORVASC) 10 MG tablet [id number] Take 1 tablet (10 mg total) by mouth daily. NFenton Foy NP Taking Active   blood glucose  meter kit and supplies [id number] Yes Dispense based on patient and insurance preference. Use up to four times daily as directed. (FOR ICD-10 E10.9, E11.9). Azzie Glatter, FNP Taking Active Self  Blood Pressure KIT [id number] Yes 1 each by Does not apply route daily. Bo Merino I, NP Taking Active   carvedilol (COREG) 25 MG tablet [id number] Yes TAKE 1 TABLET (25 MG TOTAL) BY MOUTH 2 (TWO) TIMES DAILY  WITH A MEAL. Fenton Foy, NP Taking Active   clopidogrel (PLAVIX) 75 MG tablet [id number] Yes Take 1 tablet (75 mg total) by mouth daily. Fenton Foy, NP Taking Active   docusate sodium (COLACE) 100 MG capsule [id number] Yes Take 1 capsule (100 mg total) by mouth daily. Fenton Foy, NP Taking Active   furosemide (LASIX) 40 MG tablet [id number] Yes TAKE 1 TABLET (40 MG TOTAL) BY MOUTH DAILY. Fenton Foy, NP Taking Active   gabapentin (NEURONTIN) 100 MG capsule [id number] Yes TAKE 1 CAPSULE (100 MG TOTAL) BY MOUTH 2 (TWO) TIMES DAILY. Fenton Foy, NP Taking Active   glipiZIDE (GLUCOTROL) 10 MG tablet [id number] Yes TAKE 1 TABLET (10 MG TOTAL) BY MOUTH 2 (TWO) TIMES DAILY BEFORE A MEAL. Fenton Foy, NP Taking Active   ibuprofen (ADVIL) 200 MG tablet [id number]  Take 400 mg by mouth every 6 (six) hours as needed for headache or mild pain.  Patient not taking: Reported on 09/11/2022   [provider]  Active Self  insulin glargine (LANTUS SOLOSTAR) 100 UNIT/ML Solostar Pen [id number] Yes Inject 50 Units into the skin at bedtime. Fenton Foy, NP Taking Active            Med Note Jodi Mourning, [place] T   Mon Oct 14, 2022  2:08 PM) 30 units  insulin NPH-regular Human (HUMULIN 70/30) (70-30) 100 UNIT/ML injection [id number] Yes INJECT 10 UNITS INTO THE SKIN 2 TIMES DAILY WITH A MEAL. Fenton Foy, NP Taking Active            Med Note Cleopatra Cedar Oct 14, 2022  2:07 PM) 18 units twice daily  Insulin Pen Needle (NOVOFINE) 10 each [id number]   Micheline Chapman, NP  Active   Insulin Pen Needle Flossie Buffy MINI PEN NEEDLES) 31G X 6 MM MISC [id number]  Use as directed Scot Jun, FNP  Active Self  levETIRAcetam (KEPPRA) 500 MG tablet [id number] No Take 1 tablet (500 mg total) by mouth 2 (two) times daily.  Patient not taking: Reported on 10/14/2022   Marcial Pacas, MD Not Taking Active   metFORMIN (GLUCOPHAGE) 1000 MG tablet [id number] Yes TAKE 1 TABLET (1,000  MG TOTAL) BY MOUTH 2 (TWO) TIMES DAILY WITH A MEAL. Fenton Foy, NP Taking Active   Multiple Vitamin (MULTIVITAMIN WITH MINERALS) TABS tablet [id number]  Take 1 tablet by mouth daily with breakfast. [provider]  Active Self  polyethylene glycol powder (GLYCOLAX/MIRALAX) 17 GM/SCOOP powder [id number] No Take 17 g by mouth daily.  Patient not taking: Reported on 10/14/2022   Azzie Glatter, FNP Not Taking Active Self           Med Note (LONG, ASHLEY L   Mon Jun 03, 2022  8:55 AM) As needed  spironolactone (ALDACTONE) 25 MG tablet [id number] Yes TAKE 1 TABLET (25 MG TOTAL) BY MOUTH DAILY. Fenton Foy, NP Taking Active   Med List Note Bo Merino I, [place] 03/01/22 [id number]):  Assessment/Plan:   Diabetes: - Currently controlled per last F9E but duplicative therapy that puts him at higher risk of hypoglycemia. - Reviewed goal A1c, goal fasting, and goal 2 hour post prandial glucose - Recommend to stop insulin 70/30. Continue Lantus 30 units daily. Continue glipizide 10 mg twice daily for now. Change metformin IR to metformin XR due to patient reports of diarrhea. Discussed with PCP, she is in agreement - Recommend to check glucose twice daily - fasting and 2 hour post prandial. Document readings for our next call  Hyperlipidemia/ASCVD Risk Reduction: - Currently uncontrolled due to lack of therapy - Recommend to restart atorvastatin 80 mg daily.    Heart Failure: - Currently appropriately managed - Recommend to consider ARB/ARNI and SGLT2 moving forward.   Follow Up Plan: phone call in 2 weeks  Catie TJodi Mourning, PharmD, [facility] [phone]

## 2022-10-15 NOTE — Patient Instructions (Signed)
Jared Hart,   Let's STOP the insulin 70/30. Continue Lantus 30 units daily. Let's change the metformin from the "immediate release" 1000 mg to "extended release" 500 mg tablets. The extended release generally has less associated diarrhea. Take metformin XR 500 mg - 2 tablets twice daily. Continue glipizide.   Check your blood sugars twice daily:  1) Fasting, first thing in the morning before breakfast and  2) 2 hours after your largest meal.   For a goal A1c of less than 7%, goal fasting readings are less than 130 and goal 2 hour after meal readings are less than 180.   We've sent refills to the Pharmacy at [facility] for the medications you needed.   We'll touch base in two weeks to review your sugars. Thanks!  Catie Hedwig Morton, PharmD, [facility] [phone]

## 2022-10-17 ENCOUNTER — Other Ambulatory Visit: Payer: Self-pay

## 2022-10-28 ENCOUNTER — Other Ambulatory Visit: Payer: Self-pay

## 2022-10-28 ENCOUNTER — Other Ambulatory Visit: Payer: Self-pay | Admitting: Pharmacist

## 2022-10-28 DIAGNOSIS — R7309 Other abnormal glucose: Secondary | ICD-10-CM

## 2022-10-28 DIAGNOSIS — Z794 Long term (current) use of insulin: Secondary | ICD-10-CM

## 2022-10-28 DIAGNOSIS — E118 Type 2 diabetes mellitus with unspecified complications: Secondary | ICD-10-CM

## 2022-10-28 MED ORDER — METFORMIN HCL ER 500 MG PO TB24
1000.0000 mg | ORAL_TABLET | Freq: Two times a day (BID) | ORAL | 1 refills | Status: DC
  Filled 2022-10-28 – 2022-12-25 (×2): qty 360, 90d supply, fill #0
  Filled 2023-03-26: qty 360, 90d supply, fill #1

## 2022-10-28 MED ORDER — LANTUS SOLOSTAR 100 UNIT/ML ~~LOC~~ SOPN
28.0000 [IU] | PEN_INJECTOR | Freq: Every day | SUBCUTANEOUS | 3 refills | Status: DC
  Filled 2022-10-28: qty 15, 53d supply, fill #0
  Filled 2022-11-13: qty 9, 32d supply, fill #0
  Filled 2022-12-25: qty 15, 53d supply, fill #1
  Filled 2023-02-24: qty 15, 53d supply, fill #2
  Filled 2023-08-19: qty 15, 53d supply, fill #3
  Filled 2023-09-18: qty 6, 21d supply, fill #4
  Filled 2023-09-18: qty 15, 53d supply, fill #4

## 2022-10-28 NOTE — Patient Instructions (Signed)
[place],   Keep up the great work! Please let me know if you continue to have any low (less than 80) blood sugar readings in the mornings.   Catie Hedwig Morton, PharmD, [facility] [phone]

## 2022-10-28 NOTE — Progress Notes (Signed)
10/28/2022 Name: Jared Hart MRN: [id number] DOB: February 25, 1966  Chief Complaint  Patient presents with   Medication Management   Diabetes    Jared Hart is a 56 y.o. year old male who presented for a telephone visit.   They were referred to the pharmacist by their PCP for assistance in managing diabetes.    Subjective:  Care Team: Primary Care Provider: Fenton Foy, Hart ; Next Scheduled Visit: 12/12/22  Medication Access/Adherence  Current Pharmacy:  [facility], [place] [address] Phone: [phone] Fax: [phone]   Patient reports affordability concerns with their medications: No  Patient reports access/transportation concerns to their pharmacy: No  Patient reports adherence concerns with their medications:  No     Diabetes:  Current medications: Lantus 30 units daily, glipizide 10 mg twice daily, metformin XR 1000 mg twice daily   Current glucose readings:  - Fasting: 87-100 - Pre-lunch: 70-80s - After Supper/Before Bed: 120-150s  Patient  hypoglycemic s/sx including dizziness, shakiness, sweating. Patient denies hyperglycemic symptoms including polyuria, polydipsia, polyphagia, nocturia, neuropathy, blurred vision.  Does report some diarrhea, some stomach upset.  Health Maintenance  Health Maintenance Due  Topic Date Due   Hepatitis C Screening  Never done   Zoster Vaccines- Shingrix (1 of 2) Never done   COLONOSCOPY (Pts 45-20yr Insurance coverage will need to be confirmed)  Never done   COVID-19 Vaccine (3 - Pfizer risk series) 08/16/2020   Diabetic kidney evaluation - Urine ACR  06/26/2021   OPHTHALMOLOGY EXAM  06/06/2022   INFLUENZA VACCINE  07/30/2022   FOOT EXAM  08/31/2022     Objective: Lab Results  Component Value Date   HGBA1C 8.0 (A) 06/03/2022   HGBA1C 8.0 06/03/2022   HGBA1C 8.0 (A) 06/03/2022   HGBA1C 8.0 (A) 06/03/2022    Lab Results   Component Value Date   CREATININE 1.29 (H) 06/03/2022   BUN 20 06/03/2022   NA 140 06/03/2022   K 4.5 06/03/2022   CL 100 06/03/2022   CO2 21 06/03/2022    Lab Results  Component Value Date   CHOL 135 06/03/2022   HDL 21 (Hart) 06/03/2022   LDLCALC 62 06/03/2022   LDLDIRECT 81.4 11/01/2013   TRIG 327 (H) 06/03/2022   CHOLHDL 6.4 (H) 06/03/2022    Medications Reviewed Today     Reviewed by Jared Hart (Pharmacist) on 10/14/22 at 1411  Med List Status: <None>   Medication Order Taking? Sig Documenting Provider Last Dose Status Informant  albuterol (VENTOLIN HFA) 108 (90 Base) MCG/ACT inhaler [id number] Inhale 2 puffs by mouth every 6hrs as needed for cough and shortness of breath  Patient taking differently: Inhale 2 puffs into the lungs every 6 (six) hours as needed for wheezing or shortness of breath.   Jared Hart  Active Self  amLODipine (NORVASC) 10 MG tablet [id number] Take 1 tablet (10 mg total) by mouth daily. NFenton Foy Hart Taking Active   blood glucose meter kit and supplies [id number] Dispense based on patient and insurance preference. Use up to four times daily as directed. (FOR ICD-10 E10.9, E11.9). SAzzie Glatter Jared Hart Taking Active Self  Blood Pressure KIT [id number] 1 each by Does not apply route daily. Jared Hart Taking Active   carvedilol (COREG) 25 MG tablet [id number] TAKE 1 TABLET (25 MG TOTAL) BY MOUTH 2 (TWO) TIMES DAILY WITH A  MEAL. Jared Foy, Hart Taking Active   clopidogrel (PLAVIX) 75 MG tablet [id number] Yes Take 1 tablet (75 mg total) by mouth daily. Jared Foy, Hart Taking Active   docusate sodium (COLACE) 100 MG capsule [id number] Yes Take 1 capsule (100 mg total) by mouth daily. Jared Foy, Hart Taking Active   furosemide (LASIX) 40 MG tablet [id number] Yes TAKE 1 TABLET (40 MG TOTAL) BY MOUTH DAILY. Jared Foy, Hart Taking Active   gabapentin (NEURONTIN) 100 MG capsule [id number]  Yes TAKE 1 CAPSULE (100 MG TOTAL) BY MOUTH 2 (TWO) TIMES DAILY. Jared Foy, Hart Taking Active   glipiZIDE (GLUCOTROL) 10 MG tablet [id number] Yes TAKE 1 TABLET (10 MG TOTAL) BY MOUTH 2 (TWO) TIMES DAILY BEFORE A MEAL. Jared Foy, Hart Taking Active   ibuprofen (ADVIL) 200 MG tablet [id number]  Take 400 mg by mouth every 6 (six) hours as needed for headache or mild pain.  Patient not taking: Reported on 09/11/2022   Provider, Historical, Hart  Active Self  insulin glargine (LANTUS SOLOSTAR) 100 UNIT/ML Solostar Pen [id number] Yes Inject 50 Units into the skin at bedtime. Jared Foy, Hart Taking Active            Med Note Jared Hart, Jared Hart   Mon Hart 16, 2023  2:08 PM) 30 units  insulin NPH-regular Human (HUMULIN 70/30) (70-30) 100 UNIT/ML injection [id number] Yes INJECT 10 UNITS INTO THE SKIN 2 TIMES DAILY WITH A MEAL. Jared Foy, Hart Taking Active            Med Note Jared Hart 16, 2023  2:07 PM) 18 units twice daily  Insulin Pen Needle (NOVOFINE) 10 each [id number]   Jared Hart  Active   Insulin Pen Needle Flossie Buffy MINI PEN NEEDLES) 31G X 6 MM MISC [id number]  Use as directed Jared Jun, Jared Hart  Active Self  levETIRAcetam (KEPPRA) 500 MG tablet [id number] No Take 1 tablet (500 mg total) by mouth 2 (two) times daily.  Patient not taking: Reported on 10/14/2022   Jared Hart Not Taking Active   metFORMIN (GLUCOPHAGE) 1000 MG tablet [id number] Yes TAKE 1 TABLET (1,000 MG TOTAL) BY MOUTH 2 (TWO) TIMES DAILY WITH A MEAL. Jared Foy, Hart Taking Active   Multiple Vitamin (MULTIVITAMIN WITH MINERALS) TABS tablet [id number]  Take 1 tablet by mouth daily with breakfast. Provider, Historical, Hart  Active Self  polyethylene glycol powder (GLYCOLAX/MIRALAX) 17 GM/SCOOP powder [id number] No Take 17 g by mouth daily.  Patient not taking: Reported on 10/14/2022   Jared Hart Not Taking Active Self           Med Note (Jared Hart   Mon Hart 5,  2023  8:55 AM) As needed  spironolactone (ALDACTONE) 25 MG tablet [id number] Yes TAKE 1 TABLET (25 MG TOTAL) BY MOUTH DAILY. Jared Foy, Hart Taking Active   Med List Note Jared Hart 03/01/22 3254):                Assessment/Plan:   Diabetes: - Currently controlled, though last A1c was low-normal and a high potential for hypoglycemic complications - Reviewed goal A1c, goal fasting, and goal 2 hour post prandial glucose - Recommend to reduce Lantus to 28 units daily due to lower fasting readings.  - Discussed reducing metformin dose, however, patient prefers to give it another week before adjusting the dose. He will call me if he  continues to have diarrhea or stomach upset.  - Recommend to check glucose at least twice daily, fasting and 2 hour post prandial  Follow Up Plan: PCP in ~6 weeks, PharmD call in ~ 12 weeks   Catie TJodi Hart, PharmD, [place] [phone]

## 2022-11-13 ENCOUNTER — Other Ambulatory Visit: Payer: Self-pay

## 2022-11-14 ENCOUNTER — Other Ambulatory Visit: Payer: Self-pay

## 2022-11-15 ENCOUNTER — Other Ambulatory Visit: Payer: Self-pay

## 2022-11-19 ENCOUNTER — Other Ambulatory Visit (HOSPITAL_COMMUNITY): Payer: Self-pay

## 2022-11-28 ENCOUNTER — Telehealth: Payer: Self-pay

## 2022-11-28 NOTE — Telephone Encounter (Signed)
Transition Care Management Unsuccessful Follow-up Telephone Call  Date of discharge and from where:  11/27/22  Attempts:  1st Attempt  Reason for unsuccessful TCM follow-up call:  Left voice message.  Elyse Jarvis RMA

## 2022-12-09 ENCOUNTER — Encounter: Payer: Self-pay | Admitting: Neurology

## 2022-12-09 ENCOUNTER — Ambulatory Visit: Payer: Self-pay | Admitting: Neurology

## 2022-12-12 ENCOUNTER — Ambulatory Visit: Payer: Self-pay | Admitting: Nurse Practitioner

## 2022-12-19 ENCOUNTER — Telehealth: Payer: Self-pay

## 2022-12-19 NOTE — Telephone Encounter (Signed)
Transition Care Management Unsuccessful Follow-up Telephone Call  Date of discharge and from where:  12/17/22  Attempts:  1st Attempt  Reason for unsuccessful TCM follow-up call:  Unable to reach patient  Elyse Jarvis RMA

## 2022-12-25 ENCOUNTER — Other Ambulatory Visit: Payer: Self-pay | Admitting: Nurse Practitioner

## 2022-12-25 ENCOUNTER — Other Ambulatory Visit: Payer: Self-pay

## 2022-12-25 ENCOUNTER — Telehealth: Payer: Self-pay | Admitting: Nurse Practitioner

## 2022-12-25 DIAGNOSIS — G8929 Other chronic pain: Secondary | ICD-10-CM

## 2022-12-25 DIAGNOSIS — I10 Essential (primary) hypertension: Secondary | ICD-10-CM

## 2022-12-25 DIAGNOSIS — E118 Type 2 diabetes mellitus with unspecified complications: Secondary | ICD-10-CM

## 2022-12-25 DIAGNOSIS — R7309 Other abnormal glucose: Secondary | ICD-10-CM

## 2022-12-25 MED ORDER — FUROSEMIDE 40 MG PO TABS
ORAL_TABLET | Freq: Every day | ORAL | 2 refills | Status: DC
  Filled 2022-12-25 (×2): qty 30, 30d supply, fill #0
  Filled 2023-01-21: qty 30, 30d supply, fill #1
  Filled 2023-02-24: qty 30, 30d supply, fill #2

## 2022-12-25 MED ORDER — GABAPENTIN 100 MG PO CAPS
ORAL_CAPSULE | Freq: Two times a day (BID) | ORAL | 2 refills | Status: DC
  Filled 2022-12-25: qty 60, 30d supply, fill #0
  Filled 2023-01-21: qty 60, 30d supply, fill #1
  Filled 2023-02-24: qty 60, 30d supply, fill #2

## 2022-12-25 MED ORDER — GLIPIZIDE 10 MG PO TABS
ORAL_TABLET | Freq: Two times a day (BID) | ORAL | 2 refills | Status: DC
  Filled 2022-12-25: qty 60, 30d supply, fill #0
  Filled 2023-01-21: qty 60, 30d supply, fill #1
  Filled 2023-02-24: qty 60, 30d supply, fill #2

## 2022-12-25 MED ORDER — SPIRONOLACTONE 25 MG PO TABS
ORAL_TABLET | Freq: Every day | ORAL | 2 refills | Status: DC
  Filled 2022-12-25: qty 30, 30d supply, fill #0
  Filled 2023-01-21: qty 30, 30d supply, fill #1
  Filled 2023-02-24: qty 30, 30d supply, fill #2

## 2022-12-25 NOTE — Telephone Encounter (Signed)
Caller & Relationship to patient:  MRN #  [id number]   Call Back Number:   Date of Last Office Visit: 12/19/2022     Date of Next Office Visit: 01/06/2023    Medication(s) to be Refilled: glipizide, spironolactone, ferrous mide, carvedilol  Preferred Pharmacy: [facility]  ** Please notify patient to allow 48-72 hours to process** **Let patient know to contact pharmacy at the end of the day to make sure medication is ready. ** **If patient has not been seen in a year or longer, book an appointment **Advise to use MyChart for refill requests OR to contact their pharmacy

## 2022-12-25 NOTE — Telephone Encounter (Signed)
Sent medication refill to --[facility]

## 2022-12-30 HISTORY — PX: BRAIN SURGERY: SHX531

## 2023-01-06 ENCOUNTER — Encounter: Payer: Self-pay | Admitting: Nurse Practitioner

## 2023-01-06 ENCOUNTER — Telehealth: Payer: Self-pay | Admitting: Pharmacist

## 2023-01-06 ENCOUNTER — Other Ambulatory Visit: Payer: Self-pay | Admitting: Pharmacist

## 2023-01-06 ENCOUNTER — Ambulatory Visit (INDEPENDENT_AMBULATORY_CARE_PROVIDER_SITE_OTHER): Payer: Self-pay | Admitting: Nurse Practitioner

## 2023-01-06 VITALS — BP 149/67 | HR 73 | Ht 73.0 in | Wt 250.0 lb

## 2023-01-06 DIAGNOSIS — E1165 Type 2 diabetes mellitus with hyperglycemia: Secondary | ICD-10-CM

## 2023-01-06 DIAGNOSIS — E118 Type 2 diabetes mellitus with unspecified complications: Secondary | ICD-10-CM

## 2023-01-06 DIAGNOSIS — I10 Essential (primary) hypertension: Secondary | ICD-10-CM

## 2023-01-06 DIAGNOSIS — I639 Cerebral infarction, unspecified: Secondary | ICD-10-CM

## 2023-01-06 LAB — POCT GLYCOSYLATED HEMOGLOBIN (HGB A1C): Hemoglobin A1C: 6.3 % — AB (ref 4.0–5.6)

## 2023-01-06 NOTE — Progress Notes (Signed)
Attempted to contact patient for scheduled appointment for medication management. Left HIPAA compliant message for patient to return my call at their convenience.    Catie T. Teighlor Korson, PharmD, BCACP, CPP [facility] [phone]  
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
## 2023-01-06 NOTE — Patient Instructions (Addendum)
1. Type 2 diabetes mellitus with complication, without long-term current use of insulin (HCC)  - POCT glycosylated hemoglobin (Hb A1C)  2. Acute CVA (cerebrovascular accident) ([place])   3. HYPERTENSION, BENIGN ESSENTIAL   4. Type 2 diabetes mellitus with hyperglycemia, without long-term current use of insulin ([place])   Follow up:  Follow up in 3 months

## 2023-01-15 ENCOUNTER — Encounter: Payer: Self-pay | Admitting: Nurse Practitioner

## 2023-01-15 NOTE — Assessment & Plan Note (Signed)
-  POCT glycosylated hemoglobin (Hb A1C)  2. Acute CVA (cerebrovascular accident) ([place])   3. HYPERTENSION, BENIGN ESSENTIAL   4. Type 2 diabetes mellitus with hyperglycemia, without long-term current use of insulin ([place])   Follow up:  Follow up in 3 months

## 2023-01-21 ENCOUNTER — Other Ambulatory Visit: Payer: Self-pay

## 2023-01-24 ENCOUNTER — Other Ambulatory Visit: Payer: Self-pay

## 2023-02-04 ENCOUNTER — Telehealth: Payer: Self-pay | Admitting: Pharmacist

## 2023-02-04 ENCOUNTER — Other Ambulatory Visit: Payer: Self-pay | Admitting: Pharmacist

## 2023-02-04 NOTE — Progress Notes (Signed)
Attempted to contact patient for scheduled appointment for medication management. Left HIPAA compliant message for patient to return my call at their convenience.    Catie T. Saagar Tortorella, PharmD, BCACP, CPP  [facility] [phone]  

## 2023-02-11 ENCOUNTER — Telehealth: Payer: Self-pay

## 2023-02-11 NOTE — Progress Notes (Signed)
   Care Guide Note  02/11/2023 Name: Jared Hart MRN: [id number] DOB: 07-Mar-1966  Referred by: Fenton Foy, NP Reason for referral : Care Coordination (Outreach to reschedule f/u with pharm d )   Jared Hart is a 57 y.o. year old male who is a primary care patient of Fenton Foy, NP. Jared Hart was referred to the pharmacist for assistance related to DM.    An unsuccessful telephone outreach was attempted today to contact the patient who was referred to the pharmacy team for assistance with medication management. Additional attempts will be made to contact the patient.   Noreene Larsson, [place], [place] [address] Direct Dial: [phone] [email or web address]

## 2023-02-24 ENCOUNTER — Other Ambulatory Visit: Payer: Self-pay

## 2023-02-25 ENCOUNTER — Other Ambulatory Visit: Payer: Self-pay

## 2023-03-26 ENCOUNTER — Other Ambulatory Visit: Payer: Self-pay

## 2023-03-26 ENCOUNTER — Other Ambulatory Visit: Payer: Self-pay | Admitting: Nurse Practitioner

## 2023-03-26 ENCOUNTER — Telehealth: Payer: Self-pay

## 2023-03-26 DIAGNOSIS — R7309 Other abnormal glucose: Secondary | ICD-10-CM

## 2023-03-26 DIAGNOSIS — I10 Essential (primary) hypertension: Secondary | ICD-10-CM

## 2023-03-26 DIAGNOSIS — E118 Type 2 diabetes mellitus with unspecified complications: Secondary | ICD-10-CM

## 2023-03-26 DIAGNOSIS — G8929 Other chronic pain: Secondary | ICD-10-CM

## 2023-03-26 NOTE — Telephone Encounter (Signed)
Caller & Relationship to patient:  MRN #  [id number]   Call Back Number: [phone]   Date of Last Office Visit: 01/06/2023     Date of Next Office Visit: 04/07/2023    Medication(s) to be Refilled: Lasix, Gabapentin, Glipizide, Metformin, Atorvastatin, Carvedilol   Preferred Pharmacy: [facility]   ** Please notify patient to allow 48-72 hours to process** **Let patient know to contact pharmacy at the end of the day to make sure medication is ready. ** **If patient has not been seen in a year or longer, book an appointment **Advise to use MyChart for refill requests OR to contact their pharmacy

## 2023-03-27 ENCOUNTER — Other Ambulatory Visit: Payer: Self-pay

## 2023-03-27 ENCOUNTER — Telehealth: Payer: Self-pay | Admitting: Pharmacist

## 2023-03-27 ENCOUNTER — Other Ambulatory Visit: Payer: Self-pay | Admitting: Pharmacist

## 2023-03-27 DIAGNOSIS — I10 Essential (primary) hypertension: Secondary | ICD-10-CM

## 2023-03-27 DIAGNOSIS — R7309 Other abnormal glucose: Secondary | ICD-10-CM

## 2023-03-27 DIAGNOSIS — E118 Type 2 diabetes mellitus with unspecified complications: Secondary | ICD-10-CM

## 2023-03-27 DIAGNOSIS — Z794 Long term (current) use of insulin: Secondary | ICD-10-CM

## 2023-03-27 DIAGNOSIS — G8929 Other chronic pain: Secondary | ICD-10-CM

## 2023-03-27 DIAGNOSIS — I6320 Cerebral infarction due to unspecified occlusion or stenosis of unspecified precerebral arteries: Secondary | ICD-10-CM

## 2023-03-27 MED ORDER — GLIPIZIDE 10 MG PO TABS
10.0000 mg | ORAL_TABLET | Freq: Two times a day (BID) | ORAL | 2 refills | Status: DC
  Filled 2023-03-27: qty 60, 30d supply, fill #0

## 2023-03-27 MED ORDER — METFORMIN HCL ER 500 MG PO TB24
1000.0000 mg | ORAL_TABLET | Freq: Two times a day (BID) | ORAL | 1 refills | Status: DC
  Filled 2023-06-20: qty 120, 30d supply, fill #0
  Filled 2023-07-14 – 2023-07-18 (×2): qty 120, 30d supply, fill #1
  Filled 2023-08-19: qty 120, 30d supply, fill #2

## 2023-03-27 MED ORDER — GABAPENTIN 100 MG PO CAPS
100.0000 mg | ORAL_CAPSULE | Freq: Two times a day (BID) | ORAL | 2 refills | Status: DC
  Filled 2023-04-23: qty 60, 30d supply, fill #0
  Filled 2023-06-20: qty 60, 30d supply, fill #1
  Filled 2023-07-14 – 2023-07-18 (×2): qty 60, 30d supply, fill #2

## 2023-03-27 MED ORDER — SPIRONOLACTONE 25 MG PO TABS
25.0000 mg | ORAL_TABLET | Freq: Every day | ORAL | 2 refills | Status: DC
  Filled 2023-03-27: qty 30, 30d supply, fill #0
  Filled 2023-04-23: qty 30, 30d supply, fill #1
  Filled 2023-05-27: qty 30, 30d supply, fill #2

## 2023-03-27 MED ORDER — GABAPENTIN 100 MG PO CAPS
100.0000 mg | ORAL_CAPSULE | Freq: Two times a day (BID) | ORAL | 2 refills | Status: DC
  Filled 2023-03-27: qty 60, 30d supply, fill #0

## 2023-03-27 MED ORDER — GLIPIZIDE 10 MG PO TABS
10.0000 mg | ORAL_TABLET | Freq: Two times a day (BID) | ORAL | 1 refills | Status: DC
  Filled 2023-04-23: qty 180, 90d supply, fill #0
  Filled 2023-07-14 – 2023-08-19 (×2): qty 180, 90d supply, fill #1

## 2023-03-27 MED ORDER — ATORVASTATIN CALCIUM 80 MG PO TABS
80.0000 mg | ORAL_TABLET | Freq: Every day | ORAL | 1 refills | Status: DC
  Filled 2023-03-27: qty 90, 90d supply, fill #0
  Filled 2023-06-20: qty 90, 90d supply, fill #1

## 2023-03-27 MED ORDER — FUROSEMIDE 40 MG PO TABS
40.0000 mg | ORAL_TABLET | Freq: Every day | ORAL | 2 refills | Status: DC
  Filled 2023-03-27: qty 30, 30d supply, fill #0
  Filled 2023-04-23: qty 30, 30d supply, fill #1
  Filled 2023-05-27: qty 30, 30d supply, fill #2

## 2023-03-27 MED ORDER — CARVEDILOL 25 MG PO TABS
25.0000 mg | ORAL_TABLET | Freq: Two times a day (BID) | ORAL | 1 refills | Status: DC
  Filled 2023-06-20 (×2): qty 180, 90d supply, fill #0
  Filled 2023-09-18: qty 60, 30d supply, fill #1
  Filled 2023-10-21: qty 60, 30d supply, fill #2

## 2023-03-27 NOTE — Telephone Encounter (Signed)
Done and gabapentin was sent to provider [facility]

## 2023-03-27 NOTE — Telephone Encounter (Signed)
Please advise [facility] 

## 2023-03-27 NOTE — Progress Notes (Signed)
Attempted to contact patient for scheduled appointment for medication management. Left HIPAA compliant message for patient to return my call at their convenience.   Catie T. Thursa Emme, PharmD, BCACP, CPP [facility] [phone]  

## 2023-04-07 ENCOUNTER — Encounter: Payer: Self-pay | Admitting: Nurse Practitioner

## 2023-04-07 ENCOUNTER — Ambulatory Visit: Payer: Self-pay | Admitting: Nurse Practitioner

## 2023-04-07 ENCOUNTER — Ambulatory Visit (INDEPENDENT_AMBULATORY_CARE_PROVIDER_SITE_OTHER): Payer: Self-pay | Admitting: Nurse Practitioner

## 2023-04-07 VITALS — BP 121/70 | HR 79 | Ht 73.0 in | Wt 250.6 lb

## 2023-04-07 DIAGNOSIS — Z1211 Encounter for screening for malignant neoplasm of colon: Secondary | ICD-10-CM

## 2023-04-07 DIAGNOSIS — E118 Type 2 diabetes mellitus with unspecified complications: Secondary | ICD-10-CM

## 2023-04-07 DIAGNOSIS — Z85038 Personal history of other malignant neoplasm of large intestine: Secondary | ICD-10-CM

## 2023-04-07 DIAGNOSIS — Z13220 Encounter for screening for lipoid disorders: Secondary | ICD-10-CM

## 2023-04-07 LAB — POCT GLYCOSYLATED HEMOGLOBIN (HGB A1C): Hemoglobin A1C: 7.3 % — AB (ref 4.0–5.6)
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
## 2023-04-07 NOTE — Patient Instructions (Addendum)
1. Colon cancer screening  - Ambulatory referral to Gastroenterology   2. History of colon cancer  - Ambulatory referral to Gastroenterology   3. Type 2 diabetes mellitus with complication, without long-term current use of insulin  - POCT glycosylated hemoglobin (Hb A1C) - Ambulatory referral to Ophthalmology - Ambulatory referral to Podiatry - CBC - Comprehensive metabolic panel   4. Lipid screening  - Lipid Panel   Follow up:  Follow up in 3 months

## 2023-04-08 LAB — COMPREHENSIVE METABOLIC PANEL
ALT: 8 IU/L (ref 0–44)
AST: 15 IU/L (ref 0–40)
Albumin/Globulin Ratio: 1.8 (ref 1.2–2.2)
Albumin: 4.6 g/dL (ref 3.8–4.9)
Alkaline Phosphatase: 129 IU/L — ABNORMAL HIGH (ref 44–121)
BUN/Creatinine Ratio: 16 (ref 9–20)
BUN: 22 mg/dL (ref 6–24)
Bilirubin Total: 0.3 mg/dL (ref 0.0–1.2)
CO2: 17 mmol/L — ABNORMAL LOW (ref 20–29)
Calcium: 8.8 mg/dL (ref 8.7–10.2)
Chloride: 98 mmol/L (ref 96–106)
Creatinine, Ser: 1.36 mg/dL — ABNORMAL HIGH (ref 0.76–1.27)
Globulin, Total: 2.5 g/dL (ref 1.5–4.5)
Glucose: 115 mg/dL — ABNORMAL HIGH (ref 70–99)
Potassium: 5.3 mmol/L — ABNORMAL HIGH (ref 3.5–5.2)
Sodium: 139 mmol/L (ref 134–144)
Total Protein: 7.1 g/dL (ref 6.0–8.5)
eGFR: 62 mL/min/{1.73_m2} (ref >59)

## 2023-04-08 LAB — LIPID PANEL
Chol/HDL Ratio: 5.3 ratio — ABNORMAL HIGH (ref 0.0–5.0)
Cholesterol, Total: 116 mg/dL (ref 100–199)
HDL: 22 mg/dL — ABNORMAL LOW (ref >39)
LDL Chol Calc (NIH): 30 mg/dL (ref 0–99)
Triglycerides: 454 mg/dL — ABNORMAL HIGH (ref 0–149)
VLDL Cholesterol Cal: 64 mg/dL — ABNORMAL HIGH (ref 5–40)

## 2023-04-08 LAB — CBC
Hematocrit: 33.4 % — ABNORMAL LOW (ref 37.5–51.0)
Hemoglobin: 10.9 g/dL — ABNORMAL LOW (ref 13.0–17.7)
MCH: 27.7 pg (ref 26.6–33.0)
MCHC: 32.6 g/dL (ref 31.5–35.7)
MCV: 85 fL (ref 79–97)
Platelets: 325 10*3/uL (ref 150–450)
RBC: 3.94 x10E6/uL — ABNORMAL LOW (ref 4.14–5.80)
RDW: 13.2 % (ref 11.6–15.4)
WBC: 8.6 10*3/uL (ref 3.4–10.8)

## 2023-04-08 LAB — MICROALBUMIN / CREATININE URINE RATIO
Creatinine, Urine: 136.9 mg/dL
Microalb/Creat Ratio: 185 mg/g creat — ABNORMAL HIGH (ref 0–29)
Microalbumin, Urine: 253.8 ug/mL

## 2023-04-09 ENCOUNTER — Other Ambulatory Visit: Payer: Self-pay

## 2023-04-09 ENCOUNTER — Other Ambulatory Visit: Payer: Self-pay | Admitting: Nurse Practitioner

## 2023-04-09 ENCOUNTER — Encounter: Payer: Self-pay | Admitting: Nurse Practitioner

## 2023-04-09 DIAGNOSIS — Z1211 Encounter for screening for malignant neoplasm of colon: Secondary | ICD-10-CM | POA: Insufficient documentation

## 2023-04-09 MED ORDER — OMEGA-3-ACID ETHYL ESTERS 1 G PO CAPS
1.0000 g | ORAL_CAPSULE | Freq: Two times a day (BID) | ORAL | 2 refills | Status: DC
  Filled 2023-04-09 – 2023-05-06 (×2): qty 60, 30d supply, fill #0
  Filled 2023-06-20: qty 10, 5d supply, fill #1
  Filled 2023-06-20: qty 50, 25d supply, fill #1
  Filled 2023-08-19: qty 60, 30d supply, fill #2

## 2023-04-09 NOTE — Assessment & Plan Note (Signed)
-   Ambulatory referral to Gastroenterology   2. History of colon cancer  - Ambulatory referral to Gastroenterology   3. Type 2 diabetes mellitus with complication, without long-term current use of insulin  - POCT glycosylated hemoglobin (Hb A1C) - Ambulatory referral to Ophthalmology - Ambulatory referral to Podiatry - CBC - Comprehensive metabolic panel   4. Lipid screening  - Lipid Panel   Follow up:  Follow up in 3 months

## 2023-04-18 ENCOUNTER — Other Ambulatory Visit: Payer: Self-pay

## 2023-04-23 ENCOUNTER — Other Ambulatory Visit: Payer: Self-pay

## 2023-04-25 ENCOUNTER — Other Ambulatory Visit: Payer: Self-pay

## 2023-04-29 ENCOUNTER — Other Ambulatory Visit (HOSPITAL_COMMUNITY): Payer: Self-pay

## 2023-04-29 MED ORDER — LOSARTAN POTASSIUM 25 MG PO TABS
25.0000 mg | ORAL_TABLET | Freq: Every day | ORAL | 6 refills | Status: DC
  Filled 2023-04-29 – 2023-05-06 (×2): qty 30, 30d supply, fill #0
  Filled 2023-06-20: qty 30, 30d supply, fill #1
  Filled 2023-07-14 – 2023-07-18 (×2): qty 30, 30d supply, fill #2
  Filled 2023-08-19: qty 30, 30d supply, fill #3
  Filled 2023-09-18: qty 30, 30d supply, fill #4
  Filled 2023-10-21: qty 30, 30d supply, fill #5

## 2023-05-01 ENCOUNTER — Telehealth: Payer: Self-pay | Admitting: Nurse Practitioner

## 2023-05-01 NOTE — Telephone Encounter (Signed)
Pt call and left v/m to give info on Cardiologist report.

## 2023-05-05 ENCOUNTER — Other Ambulatory Visit: Payer: Self-pay | Admitting: Nurse Practitioner

## 2023-05-05 ENCOUNTER — Other Ambulatory Visit: Payer: Self-pay

## 2023-05-05 MED ORDER — SILDENAFIL CITRATE 50 MG PO TABS
50.0000 mg | ORAL_TABLET | ORAL | 1 refills | Status: DC | PRN
  Filled 2023-05-05: qty 10, 30d supply, fill #0

## 2023-05-05 NOTE — Telephone Encounter (Signed)
Message was sent to [facility] from pt my chart. KH

## 2023-05-06 ENCOUNTER — Other Ambulatory Visit: Payer: Self-pay

## 2023-05-06 ENCOUNTER — Other Ambulatory Visit (HOSPITAL_COMMUNITY): Payer: Self-pay

## 2023-05-27 ENCOUNTER — Other Ambulatory Visit: Payer: Self-pay

## 2023-05-28 ENCOUNTER — Other Ambulatory Visit: Payer: Self-pay

## 2023-06-03 ENCOUNTER — Telehealth: Payer: Self-pay

## 2023-06-03 NOTE — Telephone Encounter (Signed)
Pt called and he advised that since he  started taking the lovaza bid he has leg cramps and some dizziness. Pt is also taking liptor 80 mg. Please advise [facility]

## 2023-06-05 NOTE — Telephone Encounter (Signed)
Called patient, left voicemail.   Catie Eppie Gibson, PharmD, BCACP, CPP [facility] [phone]

## 2023-06-13 ENCOUNTER — Other Ambulatory Visit: Payer: Self-pay

## 2023-06-20 ENCOUNTER — Other Ambulatory Visit: Payer: Self-pay | Admitting: Nurse Practitioner

## 2023-06-20 ENCOUNTER — Other Ambulatory Visit: Payer: Self-pay

## 2023-06-20 DIAGNOSIS — I10 Essential (primary) hypertension: Secondary | ICD-10-CM

## 2023-06-20 MED ORDER — FUROSEMIDE 40 MG PO TABS
40.0000 mg | ORAL_TABLET | Freq: Every day | ORAL | 2 refills | Status: DC
Start: 2023-06-20 — End: 2023-07-07
  Filled 2023-06-20: qty 30, 30d supply, fill #0

## 2023-06-20 MED ORDER — SPIRONOLACTONE 25 MG PO TABS
25.0000 mg | ORAL_TABLET | Freq: Every day | ORAL | 2 refills | Status: DC
Start: 2023-06-20 — End: 2023-09-18
  Filled 2023-06-20: qty 30, 30d supply, fill #0
  Filled 2023-07-14 – 2023-07-18 (×2): qty 30, 30d supply, fill #1
  Filled 2023-08-19: qty 30, 30d supply, fill #2

## 2023-06-20 NOTE — Telephone Encounter (Signed)
Caller & Relationship to patient:  MRN #  [id number]   Call Back Number:   Date of Last Office Visit: 06/03/2023     Date of Next Office Visit: 07/07/2023    Medication(s) to be Refilled: Lasix ,glucotrol  Preferred Pharmacy: [facility]   PT going out of town and need's this asap.  ** Please notify patient to allow 48-72 hours to process** **Let patient know to contact pharmacy at the end of the day to make sure medication is ready. ** **If patient has not been seen in a year or longer, book an appointment **Advise to use MyChart for refill requests OR to contact their pharmacy

## 2023-06-26 ENCOUNTER — Other Ambulatory Visit: Payer: Self-pay

## 2023-06-28 IMAGING — DX DG CHEST 1V PORT
1 series · 1 of 1 positions shown · non-contrast
Comparison: 02/07/2018

CLINICAL DATA: Near syncopal episode.  Dizziness.  Diaphoresis.

EXAM:
PORTABLE CHEST 1 VIEW

[chest ap]
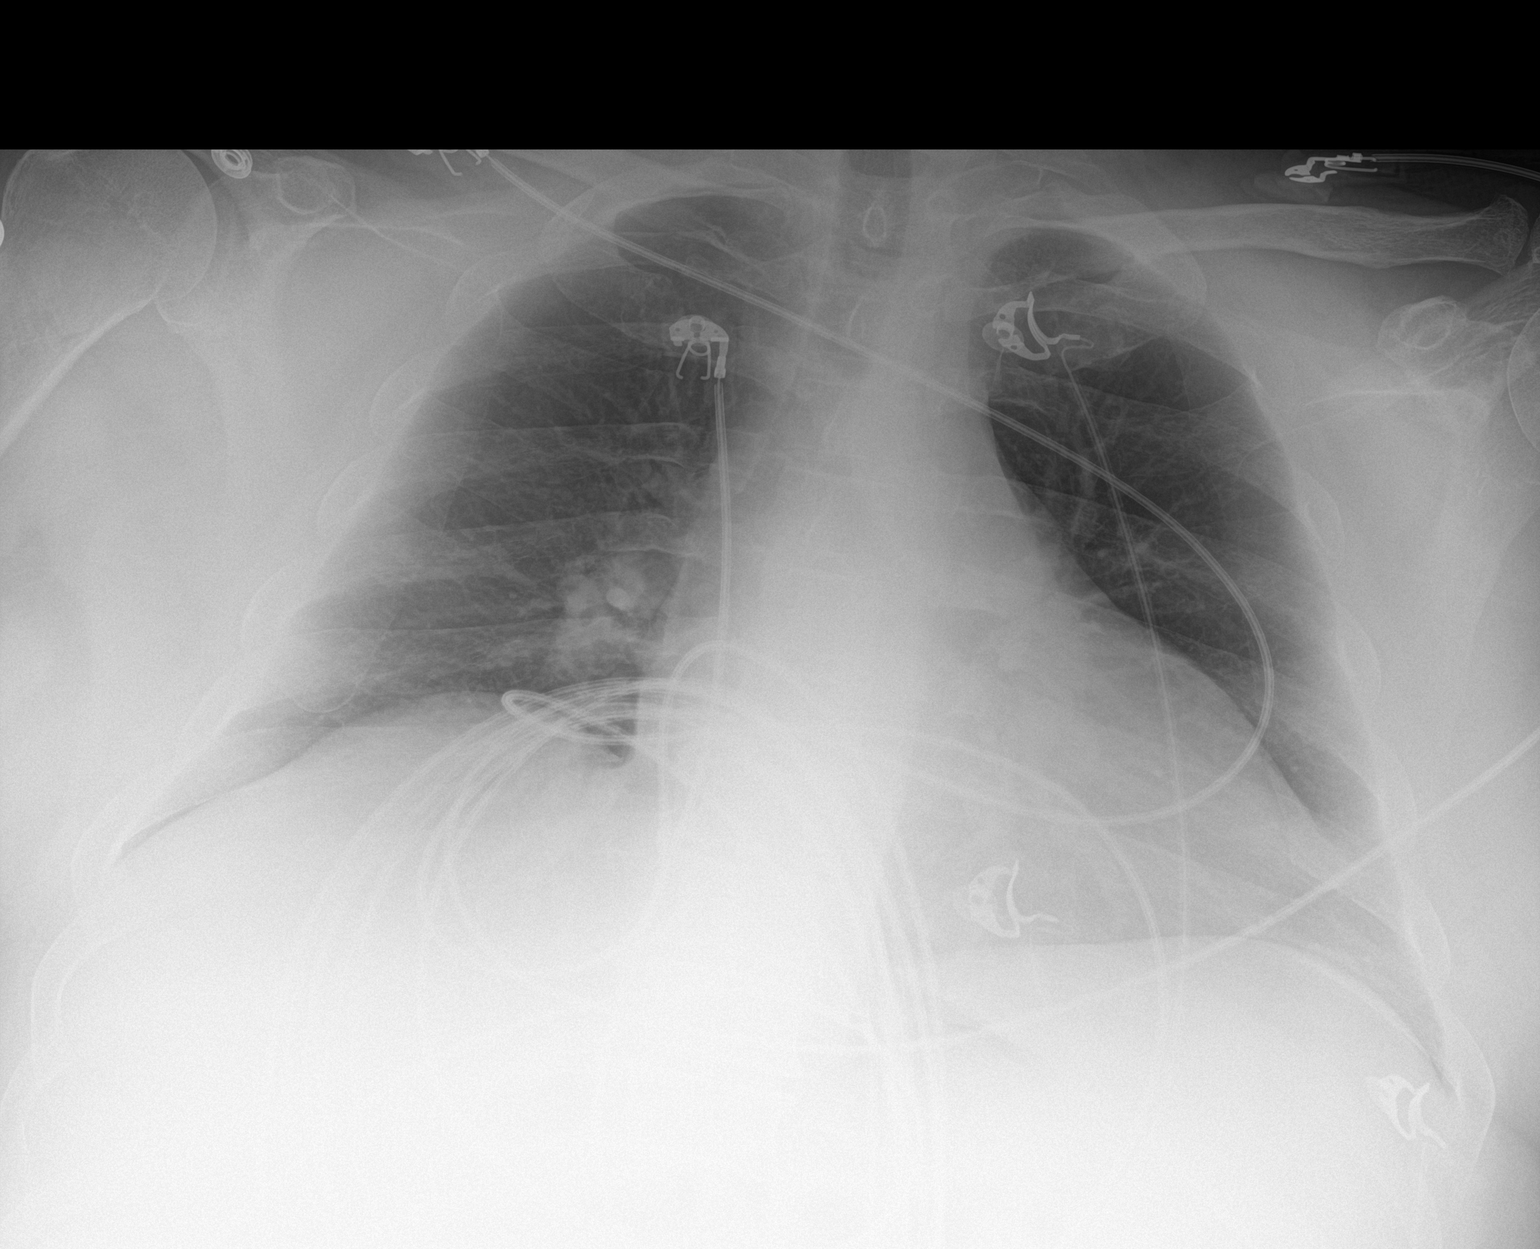

[1 of 1 positions shown; findings below may reference images not displayed]

FINDINGS: The heart size and mediastinal contours are within normal limits.
Low lung volumes are noted. Both lungs are clear. The visualized
skeletal structures are unremarkable.
IMPRESSION: No active disease.

## 2023-07-02 ENCOUNTER — Other Ambulatory Visit: Payer: Self-pay

## 2023-07-07 ENCOUNTER — Ambulatory Visit (INDEPENDENT_AMBULATORY_CARE_PROVIDER_SITE_OTHER): Payer: Self-pay | Admitting: Nurse Practitioner

## 2023-07-07 ENCOUNTER — Other Ambulatory Visit: Payer: Self-pay

## 2023-07-07 ENCOUNTER — Encounter: Payer: Self-pay | Admitting: Nurse Practitioner

## 2023-07-07 VITALS — BP 118/69 | HR 78 | Temp 97.3°F | Ht 73.0 in | Wt 248.6 lb

## 2023-07-07 DIAGNOSIS — E1165 Type 2 diabetes mellitus with hyperglycemia: Secondary | ICD-10-CM

## 2023-07-07 DIAGNOSIS — Z794 Long term (current) use of insulin: Secondary | ICD-10-CM

## 2023-07-07 DIAGNOSIS — I10 Essential (primary) hypertension: Secondary | ICD-10-CM

## 2023-07-07 MED ORDER — FUROSEMIDE 40 MG PO TABS
40.0000 mg | ORAL_TABLET | Freq: Every day | ORAL | 2 refills | Status: DC
Start: 2023-07-07 — End: 2023-11-10
  Filled 2023-07-07 – 2023-07-14 (×2): qty 40, 40d supply, fill #0
  Filled 2023-07-18: qty 30, 30d supply, fill #0
  Filled 2023-08-19: qty 30, 30d supply, fill #1
  Filled 2023-09-18 (×2): qty 30, 30d supply, fill #2
  Filled 2023-10-21: qty 30, 30d supply, fill #3

## 2023-07-07 MED ORDER — SILDENAFIL CITRATE 100 MG PO TABS
50.0000 mg | ORAL_TABLET | Freq: Every day | ORAL | 11 refills | Status: DC | PRN
  Filled 2023-07-07 – 2023-07-21 (×2): qty 5, 5d supply, fill #0
  Filled 2023-08-19: qty 5, 5d supply, fill #1
  Filled 2023-09-18: qty 5, 5d supply, fill #2

## 2023-07-07 NOTE — Assessment & Plan Note (Signed)
-   aspirin EC 325 MG tablet; Take 1 tablet by mouth daily. - POCT glycosylated hemoglobin (Hb A1C) - CBC - Comprehensive metabolic panel   2. Essential hypertension  - furosemide (LASIX) 40 MG tablet; Take 1 tablet (40 mg total) by mouth daily.  Dispense: 40 tablet; Refill: 2    Follow up:  Follow up in 3 months
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
## 2023-07-07 NOTE — Patient Instructions (Addendum)
1. Type 2 diabetes mellitus with hyperglycemia, with long-term current use of insulin (HCC)  - aspirin EC 325 MG tablet; Take 1 tablet by mouth daily. - POCT glycosylated hemoglobin (Hb A1C) - CBC - Comprehensive metabolic panel   2. Essential hypertension  - furosemide (LASIX) 40 MG tablet; Take 1 tablet (40 mg total) by mouth daily.  Dispense: 40 tablet; Refill: 2    Follow up:  Follow up in 3 months

## 2023-07-08 LAB — POCT GLYCOSYLATED HEMOGLOBIN (HGB A1C): Hemoglobin A1C: 6.7 % — AB (ref 4.0–5.6)

## 2023-07-11 ENCOUNTER — Other Ambulatory Visit: Payer: Self-pay

## 2023-07-14 ENCOUNTER — Other Ambulatory Visit: Payer: Self-pay

## 2023-07-15 ENCOUNTER — Other Ambulatory Visit: Payer: Self-pay

## 2023-07-18 ENCOUNTER — Other Ambulatory Visit: Payer: Self-pay

## 2023-07-21 ENCOUNTER — Other Ambulatory Visit: Payer: Self-pay

## 2023-08-19 ENCOUNTER — Other Ambulatory Visit: Payer: Self-pay | Admitting: Nurse Practitioner

## 2023-08-19 ENCOUNTER — Other Ambulatory Visit: Payer: Self-pay

## 2023-08-19 DIAGNOSIS — G8929 Other chronic pain: Secondary | ICD-10-CM

## 2023-08-19 MED ORDER — GABAPENTIN 100 MG PO CAPS
100.0000 mg | ORAL_CAPSULE | Freq: Two times a day (BID) | ORAL | 2 refills | Status: DC
Start: 2023-08-19 — End: 2023-11-10
  Filled 2023-08-19: qty 60, 30d supply, fill #0
  Filled 2023-09-18 (×2): qty 60, 30d supply, fill #1
  Filled 2023-10-21: qty 60, 30d supply, fill #2

## 2023-08-19 NOTE — Telephone Encounter (Signed)
Please advise Kh 

## 2023-08-19 NOTE — Telephone Encounter (Signed)
Caller & Relationship to patient:  MRN #  [id number]   Call Back Number:   Date of Last Office Visit: 07/07/2023     Date of Next Office Visit: 09/29/2023    Medication(s) to be Refilled:   Preferred Pharmacy:   ** Please notify patient to allow 48-72 hours to process** **Let patient know to contact pharmacy at the end of the day to make sure medication is ready. ** **If patient has not been seen in a year or longer, book an appointment **Advise to use MyChart for refill requests OR to contact their pharmacy

## 2023-09-18 ENCOUNTER — Other Ambulatory Visit (HOSPITAL_BASED_OUTPATIENT_CLINIC_OR_DEPARTMENT_OTHER): Payer: Self-pay

## 2023-09-18 ENCOUNTER — Other Ambulatory Visit: Payer: Self-pay | Admitting: Nurse Practitioner

## 2023-09-18 ENCOUNTER — Other Ambulatory Visit: Payer: Self-pay

## 2023-09-18 DIAGNOSIS — E118 Type 2 diabetes mellitus with unspecified complications: Secondary | ICD-10-CM

## 2023-09-18 DIAGNOSIS — I10 Essential (primary) hypertension: Secondary | ICD-10-CM

## 2023-09-18 DIAGNOSIS — R7309 Other abnormal glucose: Secondary | ICD-10-CM

## 2023-09-18 DIAGNOSIS — I6320 Cerebral infarction due to unspecified occlusion or stenosis of unspecified precerebral arteries: Secondary | ICD-10-CM

## 2023-09-18 DIAGNOSIS — E1149 Type 2 diabetes mellitus with other diabetic neurological complication: Secondary | ICD-10-CM

## 2023-09-18 MED ORDER — SILDENAFIL CITRATE 100 MG PO TABS
50.0000 mg | ORAL_TABLET | Freq: Every day | ORAL | 11 refills | Status: DC | PRN
  Filled 2023-09-18: qty 10, 30d supply, fill #0

## 2023-09-18 MED ORDER — AMLODIPINE BESYLATE 10 MG PO TABS
10.0000 mg | ORAL_TABLET | Freq: Every day | ORAL | 3 refills | Status: DC
Start: 2023-09-18 — End: 2023-11-10
  Filled 2023-09-18: qty 30, 30d supply, fill #0
  Filled 2023-10-21: qty 30, 30d supply, fill #1

## 2023-09-18 MED ORDER — OMEGA-3-ACID ETHYL ESTERS 1 G PO CAPS
1.0000 g | ORAL_CAPSULE | Freq: Two times a day (BID) | ORAL | 2 refills | Status: DC
  Filled 2023-09-18: qty 60, 30d supply, fill #0
  Filled 2023-10-21: qty 60, 30d supply, fill #1
  Filled 2023-11-17: qty 60, 30d supply, fill #2

## 2023-09-18 MED ORDER — GLIPIZIDE 10 MG PO TABS
10.0000 mg | ORAL_TABLET | Freq: Two times a day (BID) | ORAL | 1 refills | Status: DC
  Filled 2023-09-18: qty 180, 90d supply, fill #0

## 2023-09-18 MED ORDER — METFORMIN HCL ER 500 MG PO TB24
1000.0000 mg | ORAL_TABLET | Freq: Two times a day (BID) | ORAL | 1 refills | Status: DC
  Filled 2023-09-18: qty 120, 30d supply, fill #0
  Filled 2023-09-18: qty 180, 45d supply, fill #0
  Filled 2023-10-21: qty 120, 30d supply, fill #1

## 2023-09-18 MED ORDER — SPIRONOLACTONE 25 MG PO TABS
25.0000 mg | ORAL_TABLET | Freq: Every day | ORAL | 2 refills | Status: DC
  Filled 2023-09-18 (×2): qty 30, 30d supply, fill #0
  Filled 2023-10-21: qty 30, 30d supply, fill #1

## 2023-09-18 MED ORDER — ATORVASTATIN CALCIUM 80 MG PO TABS
80.0000 mg | ORAL_TABLET | Freq: Every day | ORAL | 1 refills | Status: DC
Start: 2023-09-18 — End: 2023-11-10
  Filled 2023-09-18 (×3): qty 30, 30d supply, fill #0
  Filled 2023-10-21: qty 30, 30d supply, fill #1

## 2023-09-18 NOTE — Telephone Encounter (Signed)
Caller & Relationship to patient:  MRN #  [id number]   Call Back Number:   Date of Last Office Visit: 08/19/2023     Date of Next Office Visit: 09/29/2023    Medication(s) to be Refilled: Metformin,Atorvastatin amlodipine spironolactone glipizide omega 3 fluid pills  [facility]erred Pharmacy: [facility]  ** Please notify patient to allow 48-72 hours to process** **Let patient know to contact pharmacy at the end of the day to make sure medication is ready. ** **If patient has not been seen in a year or longer, book an appointment **Advise to use MyChart for refill requests OR to contact their pharmacy

## 2023-09-19 ENCOUNTER — Other Ambulatory Visit: Payer: Self-pay | Admitting: Nurse Practitioner

## 2023-09-19 ENCOUNTER — Other Ambulatory Visit: Payer: Self-pay

## 2023-09-19 MED ORDER — TADALAFIL 20 MG PO TABS
20.0000 mg | ORAL_TABLET | Freq: Every day | ORAL | 0 refills | Status: DC | PRN
  Filled 2023-09-19: qty 10, 30d supply, fill #0

## 2023-09-29 ENCOUNTER — Other Ambulatory Visit: Payer: Self-pay

## 2023-09-29 DIAGNOSIS — E1165 Type 2 diabetes mellitus with hyperglycemia: Secondary | ICD-10-CM

## 2023-09-29 DIAGNOSIS — I10 Essential (primary) hypertension: Secondary | ICD-10-CM

## 2023-09-30 LAB — COMPREHENSIVE METABOLIC PANEL
ALT: 9 [IU]/L (ref 0–44)
AST: 16 [IU]/L (ref 0–40)
Albumin: 4.6 g/dL (ref 3.8–4.9)
Alkaline Phosphatase: 117 [IU]/L (ref 44–121)
BUN/Creatinine Ratio: 15 (ref 9–20)
BUN: 16 mg/dL (ref 6–24)
Bilirubin Total: 0.3 mg/dL (ref 0.0–1.2)
CO2: 22 mmol/L (ref 20–29)
Calcium: 9.4 mg/dL (ref 8.7–10.2)
Chloride: 100 mmol/L (ref 96–106)
Creatinine, Ser: 1.07 mg/dL (ref 0.76–1.27)
Globulin, Total: 2.4 g/dL (ref 1.5–4.5)
Glucose: 163 mg/dL — ABNORMAL HIGH (ref 70–99)
Potassium: 4.8 mmol/L (ref 3.5–5.2)
Sodium: 138 mmol/L (ref 134–144)
Total Protein: 7 g/dL (ref 6.0–8.5)
eGFR: 82 mL/min/{1.73_m2} (ref >59)

## 2023-09-30 LAB — CBC
Hematocrit: 33.1 % — ABNORMAL LOW (ref 37.5–51.0)
Hemoglobin: 10.8 g/dL — ABNORMAL LOW (ref 13.0–17.7)
MCH: 28.9 pg (ref 26.6–33.0)
MCHC: 32.6 g/dL (ref 31.5–35.7)
MCV: 89 fL (ref 79–97)
Platelets: 303 10*3/uL (ref 150–450)
RBC: 3.74 x10E6/uL — ABNORMAL LOW (ref 4.14–5.80)
RDW: 12.6 % (ref 11.6–15.4)
WBC: 7.3 10*3/uL (ref 3.4–10.8)

## 2023-10-08 ENCOUNTER — Ambulatory Visit: Payer: Self-pay | Admitting: Nurse Practitioner

## 2023-10-21 ENCOUNTER — Other Ambulatory Visit: Payer: Self-pay

## 2023-10-21 ENCOUNTER — Other Ambulatory Visit: Payer: Self-pay | Admitting: Nurse Practitioner

## 2023-10-21 ENCOUNTER — Other Ambulatory Visit (HOSPITAL_BASED_OUTPATIENT_CLINIC_OR_DEPARTMENT_OTHER): Payer: Self-pay

## 2023-10-21 DIAGNOSIS — E118 Type 2 diabetes mellitus with unspecified complications: Secondary | ICD-10-CM

## 2023-10-21 DIAGNOSIS — R7309 Other abnormal glucose: Secondary | ICD-10-CM

## 2023-10-21 MED ORDER — LANTUS SOLOSTAR 100 UNIT/ML ~~LOC~~ SOPN
28.0000 [IU] | PEN_INJECTOR | Freq: Every day | SUBCUTANEOUS | 1 refills | Status: DC
  Filled 2023-10-21: qty 15, 53d supply, fill #0

## 2023-11-10 ENCOUNTER — Encounter: Payer: Self-pay | Admitting: Nurse Practitioner

## 2023-11-10 ENCOUNTER — Ambulatory Visit (INDEPENDENT_AMBULATORY_CARE_PROVIDER_SITE_OTHER): Payer: Self-pay | Admitting: Nurse Practitioner

## 2023-11-10 ENCOUNTER — Other Ambulatory Visit: Payer: Self-pay

## 2023-11-10 VITALS — BP 146/74 | HR 70 | Ht 73.0 in | Wt 261.0 lb

## 2023-11-10 DIAGNOSIS — I499 Cardiac arrhythmia, unspecified: Secondary | ICD-10-CM

## 2023-11-10 DIAGNOSIS — I10 Essential (primary) hypertension: Secondary | ICD-10-CM

## 2023-11-10 DIAGNOSIS — R7309 Other abnormal glucose: Secondary | ICD-10-CM

## 2023-11-10 DIAGNOSIS — N529 Male erectile dysfunction, unspecified: Secondary | ICD-10-CM

## 2023-11-10 DIAGNOSIS — Z23 Encounter for immunization: Secondary | ICD-10-CM

## 2023-11-10 DIAGNOSIS — E1165 Type 2 diabetes mellitus with hyperglycemia: Secondary | ICD-10-CM

## 2023-11-10 DIAGNOSIS — E118 Type 2 diabetes mellitus with unspecified complications: Secondary | ICD-10-CM

## 2023-11-10 DIAGNOSIS — I6320 Cerebral infarction due to unspecified occlusion or stenosis of unspecified precerebral arteries: Secondary | ICD-10-CM

## 2023-11-10 DIAGNOSIS — G8929 Other chronic pain: Secondary | ICD-10-CM

## 2023-11-10 DIAGNOSIS — E1149 Type 2 diabetes mellitus with other diabetic neurological complication: Secondary | ICD-10-CM

## 2023-11-10 DIAGNOSIS — Z794 Long term (current) use of insulin: Secondary | ICD-10-CM

## 2023-11-10 DIAGNOSIS — M79672 Pain in left foot: Secondary | ICD-10-CM

## 2023-11-10 LAB — POCT GLYCOSYLATED HEMOGLOBIN (HGB A1C): Hemoglobin A1C: 8.4 % — AB (ref 4.0–5.6)

## 2023-11-10 MED ORDER — SPIRONOLACTONE 25 MG PO TABS
25.0000 mg | ORAL_TABLET | Freq: Every day | ORAL | 2 refills | Status: DC
  Filled 2023-11-10 – 2023-11-17 (×2): qty 30, 30d supply, fill #0
  Filled 2023-12-17: qty 30, 30d supply, fill #1
  Filled 2024-01-14: qty 30, 30d supply, fill #2

## 2023-11-10 MED ORDER — LANTUS SOLOSTAR 100 UNIT/ML ~~LOC~~ SOPN
28.0000 [IU] | PEN_INJECTOR | Freq: Every day | SUBCUTANEOUS | 1 refills | Status: DC
  Filled 2023-11-10 – 2023-11-17 (×2): qty 15, 53d supply, fill #0
  Filled 2023-12-17: qty 15, 53d supply, fill #1

## 2023-11-10 MED ORDER — LOSARTAN POTASSIUM 25 MG PO TABS
25.0000 mg | ORAL_TABLET | Freq: Every day | ORAL | 6 refills | Status: DC
  Filled 2023-11-10 – 2023-11-17 (×2): qty 30, 30d supply, fill #0
  Filled 2023-12-17: qty 30, 30d supply, fill #1
  Filled 2024-01-14: qty 30, 30d supply, fill #2
  Filled 2024-02-12: qty 30, 30d supply, fill #3
  Filled 2024-03-10 (×2): qty 30, 30d supply, fill #4
  Filled 2024-03-29 – 2024-04-01 (×2): qty 30, 30d supply, fill #5

## 2023-11-10 MED ORDER — GABAPENTIN 100 MG PO CAPS
100.0000 mg | ORAL_CAPSULE | Freq: Two times a day (BID) | ORAL | 2 refills | Status: DC
  Filled 2023-11-10 – 2023-11-17 (×2): qty 60, 30d supply, fill #0
  Filled 2023-12-17: qty 60, 30d supply, fill #1
  Filled 2024-01-14 (×2): qty 60, 30d supply, fill #2

## 2023-11-10 MED ORDER — FUROSEMIDE 40 MG PO TABS
40.0000 mg | ORAL_TABLET | Freq: Every day | ORAL | 2 refills | Status: DC
  Filled 2023-11-10: qty 40, 40d supply, fill #0
  Filled 2023-11-17: qty 30, 30d supply, fill #0
  Filled 2023-12-17: qty 30, 30d supply, fill #1
  Filled 2024-01-14: qty 30, 30d supply, fill #2
  Filled 2024-02-12: qty 30, 30d supply, fill #3

## 2023-11-10 MED ORDER — GLIPIZIDE 10 MG PO TABS
10.0000 mg | ORAL_TABLET | Freq: Two times a day (BID) | ORAL | 1 refills | Status: DC
  Filled 2023-11-10 – 2023-11-17 (×2): qty 180, 90d supply, fill #0
  Filled 2024-02-12: qty 180, 90d supply, fill #1

## 2023-11-10 MED ORDER — TADALAFIL 20 MG PO TABS
20.0000 mg | ORAL_TABLET | Freq: Every day | ORAL | 0 refills | Status: DC | PRN
  Filled 2023-11-10: qty 10, 30d supply, fill #0

## 2023-11-10 MED ORDER — METFORMIN HCL ER 500 MG PO TB24
1000.0000 mg | ORAL_TABLET | Freq: Two times a day (BID) | ORAL | 1 refills | Status: DC
  Filled 2023-11-10: qty 180, 45d supply, fill #0
  Filled 2023-11-17: qty 120, 30d supply, fill #0
  Filled 2023-12-17: qty 120, 30d supply, fill #1
  Filled 2024-01-14: qty 120, 30d supply, fill #2

## 2023-11-10 MED ORDER — ATORVASTATIN CALCIUM 80 MG PO TABS
80.0000 mg | ORAL_TABLET | Freq: Every day | ORAL | 1 refills | Status: DC
  Filled 2023-11-10: qty 90, 90d supply, fill #0
  Filled 2023-11-17: qty 30, 30d supply, fill #0
  Filled 2023-12-17: qty 30, 30d supply, fill #1
  Filled 2024-01-14: qty 30, 30d supply, fill #2
  Filled 2024-02-12: qty 30, 30d supply, fill #3
  Filled 2024-03-10 (×2): qty 30, 30d supply, fill #4
  Filled 2024-03-29 – 2024-04-01 (×2): qty 30, 30d supply, fill #5

## 2023-11-10 MED ORDER — CARVEDILOL 25 MG PO TABS
25.0000 mg | ORAL_TABLET | Freq: Two times a day (BID) | ORAL | 1 refills | Status: DC
  Filled 2023-11-10: qty 180, 90d supply, fill #0
  Filled 2023-11-17: qty 60, 30d supply, fill #0
  Filled 2023-12-17: qty 60, 30d supply, fill #1
  Filled 2024-01-14: qty 60, 30d supply, fill #2
  Filled 2024-02-12: qty 60, 30d supply, fill #3
  Filled 2024-03-10 (×2): qty 60, 30d supply, fill #4
  Filled 2024-03-29 – 2024-04-01 (×2): qty 60, 30d supply, fill #5

## 2023-11-10 MED ORDER — AMLODIPINE BESYLATE 10 MG PO TABS
10.0000 mg | ORAL_TABLET | Freq: Every day | ORAL | 3 refills | Status: DC
  Filled 2023-11-10: qty 90, 90d supply, fill #0
  Filled 2023-11-17: qty 30, 30d supply, fill #0
  Filled 2023-12-17: qty 30, 30d supply, fill #1
  Filled 2024-01-14: qty 30, 30d supply, fill #2
  Filled 2024-02-12: qty 30, 30d supply, fill #3
  Filled 2024-03-10 (×2): qty 30, 30d supply, fill #4
  Filled 2024-03-29 – 2024-04-01 (×2): qty 30, 30d supply, fill #5
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
## 2023-11-10 NOTE — Patient Instructions (Signed)
1. Type 2 diabetes mellitus with hyperglycemia, with long-term current use of insulin (HCC)  - POCT glycosylated hemoglobin (Hb A1C) - CBC - Comprehensive metabolic panel  2. Erectile dysfunction, unspecified erectile dysfunction type  - tadalafil (CIALIS) 20 MG tablet; Take 1 tablet (20 mg total) by mouth daily as needed for erectile dysfunction.  Dispense: 10 tablet; Refill: 0  3. Need for influenza vaccination  - Flu vaccine trivalent PF, 6mos and older(Flulaval,Afluria,Fluarix,Fluzone)  4. Elevated glucose  - glipiZIDE (GLUCOTROL) 10 MG tablet; Take 1 tablet (10 mg total) by mouth 2 (two) times daily with a meal.  Dispense: 180 tablet; Refill: 1  5. Type 2 diabetes mellitus with complication, without long-term current use of insulin (HCC)  - glipiZIDE (GLUCOTROL) 10 MG tablet; Take 1 tablet (10 mg total) by mouth 2 (two) times daily with a meal.  Dispense: 180 tablet; Refill: 1  Follow up:  Follow up in 3 months

## 2023-11-17 ENCOUNTER — Other Ambulatory Visit: Payer: Self-pay

## 2023-11-17 ENCOUNTER — Other Ambulatory Visit (HOSPITAL_COMMUNITY): Payer: Self-pay

## 2023-11-18 ENCOUNTER — Other Ambulatory Visit: Payer: Self-pay

## 2023-11-28 ENCOUNTER — Other Ambulatory Visit: Payer: Self-pay

## 2023-12-17 ENCOUNTER — Other Ambulatory Visit: Payer: Self-pay | Admitting: Nurse Practitioner

## 2023-12-17 ENCOUNTER — Other Ambulatory Visit: Payer: Self-pay

## 2023-12-17 DIAGNOSIS — N529 Male erectile dysfunction, unspecified: Secondary | ICD-10-CM

## 2023-12-17 MED ORDER — OMEGA-3-ACID ETHYL ESTERS 1 G PO CAPS
1.0000 g | ORAL_CAPSULE | Freq: Two times a day (BID) | ORAL | 2 refills | Status: DC
  Filled 2023-12-17: qty 60, 30d supply, fill #0

## 2023-12-17 MED ORDER — TADALAFIL 20 MG PO TABS
20.0000 mg | ORAL_TABLET | Freq: Every day | ORAL | 0 refills | Status: DC | PRN
  Filled 2023-12-17: qty 10, 30d supply, fill #0

## 2023-12-18 ENCOUNTER — Other Ambulatory Visit: Payer: Self-pay | Admitting: Nurse Practitioner

## 2023-12-18 ENCOUNTER — Other Ambulatory Visit: Payer: Self-pay

## 2023-12-18 DIAGNOSIS — N529 Male erectile dysfunction, unspecified: Secondary | ICD-10-CM

## 2023-12-18 MED ORDER — OMEGA-3-ACID ETHYL ESTERS 1 G PO CAPS
1.0000 g | ORAL_CAPSULE | Freq: Two times a day (BID) | ORAL | 2 refills | Status: DC
  Filled 2024-01-14: qty 60, 30d supply, fill #0
  Filled 2024-02-12: qty 60, 30d supply, fill #1
  Filled 2024-03-10 (×2): qty 60, 30d supply, fill #2

## 2023-12-18 MED ORDER — TADALAFIL 20 MG PO TABS: 20.0000 mg | ORAL_TABLET | Freq: Every day | ORAL | 0 refills | Status: DC | PRN

## 2024-01-14 ENCOUNTER — Other Ambulatory Visit: Payer: Self-pay

## 2024-01-15 ENCOUNTER — Other Ambulatory Visit: Payer: Self-pay

## 2024-01-19 ENCOUNTER — Other Ambulatory Visit (HOSPITAL_COMMUNITY): Payer: Self-pay

## 2024-01-19 MED ORDER — POLYETHYLENE GLYCOL 3350 17 G PO PACK
17.0000 g | PACK | Freq: Every day | ORAL | 0 refills | Status: DC
  Filled 2024-01-19: qty 28, 28d supply, fill #0
  Filled 2024-01-19: qty 30, 30d supply, fill #0

## 2024-01-20 ENCOUNTER — Telehealth: Payer: Self-pay

## 2024-01-20 NOTE — Telephone Encounter (Signed)
See note

## 2024-01-21 NOTE — Telephone Encounter (Signed)
Sent my chart message. KH 

## 2024-01-23 ENCOUNTER — Telehealth: Payer: Self-pay

## 2024-01-23 NOTE — Transitions of Care (Post Inpatient/ED Visit) (Cosign Needed)
   01/23/2024  Name: Jared Hart MRN: [id number] DOB: 1966-01-13  Today's TOC FU Call Status: Today's TOC FU Call Status:: Unsuccessful Call (2nd Attempt) Unsuccessful Call (2nd Attempt) Date: 01/23/24  Attempted to reach the patient regarding the most recent Inpatient/ED visit.  Follow Up Plan: Additional outreach attempts will be made to reach the patient to complete the Transitions of Care (Post Inpatient/ED visit) call.   Signature Renelda Loma RMA

## 2024-01-26 ENCOUNTER — Telehealth: Payer: Self-pay

## 2024-01-26 NOTE — Transitions of Care (Post Inpatient/ED Visit) (Cosign Needed)
   01/26/2024  Name: Jared Hart MRN: [id number] DOB: 1966-04-08  Today's TOC FU Call Status: Today's TOC FU Call Status:: Unsuccessful Call (3rd Attempt) Unsuccessful Call (3rd Attempt) Date: 01/26/24  Attempted to reach the patient regarding the most recent Inpatient/ED visit.  Follow Up Plan: No further outreach attempts will be made at this time. We have been unable to contact the patient.  Signature Renelda Loma RMA

## 2024-01-29 ENCOUNTER — Other Ambulatory Visit (HOSPITAL_COMMUNITY): Payer: Self-pay

## 2024-02-09 ENCOUNTER — Ambulatory Visit: Payer: Medicaid Other | Admitting: Nurse Practitioner

## 2024-02-09 ENCOUNTER — Encounter: Payer: Self-pay | Admitting: Nurse Practitioner

## 2024-02-09 ENCOUNTER — Other Ambulatory Visit: Payer: Self-pay

## 2024-02-09 VITALS — BP 137/64 | HR 70 | Temp 97.9°F | Wt 259.2 lb

## 2024-02-09 DIAGNOSIS — N529 Male erectile dysfunction, unspecified: Secondary | ICD-10-CM | POA: Diagnosis not present

## 2024-02-09 DIAGNOSIS — K5900 Constipation, unspecified: Secondary | ICD-10-CM

## 2024-02-09 DIAGNOSIS — E1165 Type 2 diabetes mellitus with hyperglycemia: Secondary | ICD-10-CM

## 2024-02-09 DIAGNOSIS — E118 Type 2 diabetes mellitus with unspecified complications: Secondary | ICD-10-CM

## 2024-02-09 DIAGNOSIS — Z794 Long term (current) use of insulin: Secondary | ICD-10-CM

## 2024-02-09 DIAGNOSIS — I8391 Asymptomatic varicose veins of right lower extremity: Secondary | ICD-10-CM

## 2024-02-09 LAB — POCT GLYCOSYLATED HEMOGLOBIN (HGB A1C): Hemoglobin A1C: 7.7 % — AB (ref 4.0–5.6)

## 2024-02-09 MED ORDER — SENNOSIDES 8.6 MG PO TABS
1.0000 | ORAL_TABLET | Freq: Two times a day (BID) | ORAL | 2 refills | Status: DC
  Filled 2024-02-09: qty 60, 30d supply, fill #0
  Filled 2024-03-10 (×2): qty 60, 30d supply, fill #1
  Filled 2024-03-29 – 2024-04-01 (×2): qty 60, 30d supply, fill #2

## 2024-02-09 MED ORDER — TADALAFIL 20 MG PO TABS
20.0000 mg | ORAL_TABLET | Freq: Every day | ORAL | 0 refills | Status: DC | PRN
  Filled 2024-02-09: qty 10, 30d supply, fill #0

## 2024-02-09 NOTE — Patient Instructions (Signed)
 1. Type 2 diabetes mellitus with hyperglycemia, with long-term current use of insulin  (HCC) (Primary)  - POCT glycosylated hemoglobin (Hb A1C)  2. Type 2 diabetes mellitus with complication, without long-term current use of insulin  (HCC)  - POCT glycosylated hemoglobin (Hb A1C)  3. Constipation, unspecified constipation type  - Ambulatory referral to Gastroenterology - senna (SENOKOT) 8.6 MG tablet; Take 1 tablet (8.6 mg total) by mouth 2 (two) times daily.  Dispense: 60 tablet; Refill: 2  4. Erectile dysfunction, unspecified erectile dysfunction type  - tadalafil  (CIALIS ) 20 MG tablet; Take 1 tablet (20 mg total) by mouth daily as needed for erectile dysfunction.  Dispense: 10 tablet; Refill: 0  5. Varicose veins of right lower extremity, unspecified whether complicated  - Ambulatory referral to Vascular Surgery  Follow up:  Follow up in 3 months
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
## 2024-02-11 ENCOUNTER — Telehealth: Payer: Self-pay | Admitting: Gastroenterology

## 2024-02-11 NOTE — Telephone Encounter (Signed)
Urgent referral in [facility] for constipation.  Patient stated he had colon cancer when he was 58 yrs old.  Has not seen GI since then.  Dr. Lavon Paganini DOD 2/12/25AM.  Please review and advise scheduling.  Thanks

## 2024-02-11 NOTE — Telephone Encounter (Signed)
Spoke with the patient. Agrees to 02/27/24 at 1:30 pm with Deanna May. Will put onto schedule once 7 day hold is released.

## 2024-02-12 ENCOUNTER — Other Ambulatory Visit: Payer: Self-pay

## 2024-02-12 ENCOUNTER — Other Ambulatory Visit (HOSPITAL_COMMUNITY): Payer: Self-pay

## 2024-02-12 ENCOUNTER — Other Ambulatory Visit: Payer: Self-pay | Admitting: Nurse Practitioner

## 2024-02-12 DIAGNOSIS — G8929 Other chronic pain: Secondary | ICD-10-CM

## 2024-02-12 DIAGNOSIS — E1149 Type 2 diabetes mellitus with other diabetic neurological complication: Secondary | ICD-10-CM

## 2024-02-12 DIAGNOSIS — I10 Essential (primary) hypertension: Secondary | ICD-10-CM

## 2024-02-12 MED ORDER — METFORMIN HCL ER 500 MG PO TB24
1000.0000 mg | ORAL_TABLET | Freq: Two times a day (BID) | ORAL | 1 refills | Status: DC
  Filled 2024-02-12: qty 180, 45d supply, fill #0
  Filled 2024-03-10 – 2024-03-16 (×2): qty 180, 45d supply, fill #1

## 2024-02-12 MED ORDER — FUROSEMIDE 40 MG PO TABS
40.0000 mg | ORAL_TABLET | Freq: Every day | ORAL | 2 refills | Status: DC
  Filled 2024-03-10 (×2): qty 40, 40d supply, fill #0
  Filled 2024-03-29 – 2024-04-19 (×3): qty 40, 40d supply, fill #1

## 2024-02-12 MED ORDER — SPIRONOLACTONE 25 MG PO TABS
25.0000 mg | ORAL_TABLET | Freq: Every day | ORAL | 2 refills | Status: DC
Start: 2024-02-12 — End: 2024-05-13
  Filled 2024-02-12: qty 30, 30d supply, fill #0
  Filled 2024-03-10 (×2): qty 30, 30d supply, fill #1
  Filled 2024-03-29 – 2024-04-01 (×2): qty 30, 30d supply, fill #2

## 2024-02-12 MED ORDER — GABAPENTIN 100 MG PO CAPS
100.0000 mg | ORAL_CAPSULE | Freq: Two times a day (BID) | ORAL | 2 refills | Status: DC
  Filled 2024-02-12: qty 60, 30d supply, fill #0
  Filled 2024-03-10 (×2): qty 60, 30d supply, fill #1
  Filled 2024-03-29 – 2024-04-01 (×2): qty 60, 30d supply, fill #2

## 2024-02-12 NOTE — Telephone Encounter (Signed)
Copied from CRM [id number]. Topic: Clinical - Medication Refill >> Feb 12, 2024  9:10 AM [name] wrote: Most Recent Primary Care Visit:  Provider: [name]  Department: [facility]  Visit Type: OFFICE VISIT  Date: 02/09/2024  Medication: furosemide (LASIX) 40 MG tablet   Has the patient contacted their pharmacy? Yes (Agent: If no, request that the patient contact the pharmacy for the refill. If patient does not wish to contact the pharmacy document the reason why and proceed with request.) (Agent: If yes, when and what did the pharmacy advise?)  Is this the correct pharmacy for this prescription? Yes If no, delete pharmacy and type the correct one.   This is the patient's preferred pharmacy:  [facility] [address] [place] [address] Phone: [phone] Fax: [phone]    Is the patient out of the medication? No  Has the patient been seen for an appointment in the last year OR does the patient have an upcoming appointment? Yes  Can we respond through MyChart? Yes  Agent: Please be advised that Rx refills may take up to 3 business days. We ask that you follow-up with your pharmacy.

## 2024-02-12 NOTE — Telephone Encounter (Signed)
Please advise [facility]

## 2024-02-12 NOTE — Telephone Encounter (Signed)
Please advise [facility]
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
## 2024-02-27 ENCOUNTER — Ambulatory Visit (INDEPENDENT_AMBULATORY_CARE_PROVIDER_SITE_OTHER): Payer: Medicaid Other | Admitting: Gastroenterology

## 2024-02-27 ENCOUNTER — Encounter: Payer: Self-pay | Admitting: Gastroenterology

## 2024-02-27 VITALS — BP 124/66 | HR 72 | Ht 73.0 in | Wt 264.4 lb

## 2024-02-27 DIAGNOSIS — K5904 Chronic idiopathic constipation: Secondary | ICD-10-CM

## 2024-02-27 DIAGNOSIS — R12 Heartburn: Secondary | ICD-10-CM | POA: Diagnosis not present

## 2024-02-27 DIAGNOSIS — Z85038 Personal history of other malignant neoplasm of large intestine: Secondary | ICD-10-CM

## 2024-02-27 DIAGNOSIS — R140 Abdominal distension (gaseous): Secondary | ICD-10-CM

## 2024-02-27 MED ORDER — LINACLOTIDE 145 MCG PO CAPS: 145.0000 ug | ORAL_CAPSULE | Freq: Every day | ORAL | 0 refills | Status: DC

## 2024-02-27 NOTE — Patient Instructions (Addendum)
 Recommend GERD diet, no late meals  Over the counter Pepcid 20 mg as needed for reflux  Recommend you discuss colon screening surveillance with your two children and your siblings due to increased risk of colon cancer.  We have given you samples of the following medication to take: Linzess 145 mcg once daily.  You have been scheduled for a colonoscopy. Please follow written instructions given to you at your visit today.   If you use inhalers (even only as needed), please bring them with you on the day of your procedure.  DO NOT TAKE 7 DAYS PRIOR TO TEST- Trulicity (dulaglutide) Ozempic, Wegovy (semaglutide) Mounjaro (tirzepatide) Bydureon Bcise (exanatide extended release)  DO NOT TAKE 1 DAY PRIOR TO YOUR TEST Rybelsus (semaglutide) Adlyxin (lixisenatide) Victoza (liraglutide) Byetta (exanatide)  Thank you for trusting me with your gastrointestinal care!   Deanna May, NP  ___________________________________________________________________________  Bonita Quin will receive your bowel preparation through [facility], which ensures the lowest copay and home delivery, with outreach via text or call from an [phone] number. Please respond promptly to avoid rescheduling of your procedure. If you are interested in alternative options or have any questions regarding your prep, please contact them at [phone] ____________________________________________________________________________  Your Provider Has Sent Your Bowel Prep Regimen To [facility]   [facility] will contact you to verify your information and collect your copay, if applicable. Enjoy the comfort of your home while your prescription is mailed to you, FREE of any shipping charges.   [facility] accepts all major insurance benefits and applies discounts & coupons.  Have additional questions?   Chat: [email or web address] Call: [phone] Email: [email or web address] NCPDP: [id number]  How will [facility] contact you?   With a Welcome phone call,  a Welcome text and a checkout link in text form.  Texts you receive from [phone] Are NOT Spam.  *To set up delivery, you must complete the checkout process via link or speak to one of the patient care representatives. If [facility] is unable to reach you, your prescription may be delayed.  To avoid long hold times on the phone, you may also utilize the secure chat feature on the [facility] website to request that they call you back for transaction completion or to expedite your concerns.   _______________________________________________________  If your blood pressure at your visit was 140/90 or greater, please contact your primary care physician to follow up on this.  _______________________________________________________  If you are age 10 or older, your body mass index should be between 23-30. Your Body mass index is 34.88 kg/m. If this is out of the aforementioned range listed, please consider follow up with your Primary Care Provider.  If you are age 38 or younger, your body mass index should be between 19-25. Your Body mass index is 34.88 kg/m. If this is out of the aformentioned range listed, please consider follow up with your Primary Care Provider.   ________________________________________________________  The [facility] providers would like to encourage you to use [facility] to communicate with providers for non-urgent requests or questions.  Due to long hold times on the telephone, sending your provider a message by [facility] may be a faster and more efficient way to get a response.  Please allow 48 business hours for a response.  Please remember that this is for non-urgent requests.  _______________________________________________________
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
## 2024-03-02 ENCOUNTER — Encounter: Payer: Self-pay | Admitting: Pediatrics

## 2024-03-02 ENCOUNTER — Ambulatory Visit: Payer: Medicaid Other | Admitting: Pediatrics

## 2024-03-02 VITALS — BP 144/78 | HR 62 | Temp 97.9°F | Resp 18 | Ht 73.0 in | Wt 264.0 lb

## 2024-03-02 DIAGNOSIS — K635 Polyp of colon: Secondary | ICD-10-CM

## 2024-03-02 DIAGNOSIS — D128 Benign neoplasm of rectum: Secondary | ICD-10-CM

## 2024-03-02 DIAGNOSIS — K648 Other hemorrhoids: Secondary | ICD-10-CM

## 2024-03-02 DIAGNOSIS — Z1211 Encounter for screening for malignant neoplasm of colon: Secondary | ICD-10-CM

## 2024-03-02 DIAGNOSIS — K5900 Constipation, unspecified: Secondary | ICD-10-CM

## 2024-03-02 DIAGNOSIS — Z980 Intestinal bypass and anastomosis status: Secondary | ICD-10-CM

## 2024-03-02 DIAGNOSIS — D124 Benign neoplasm of descending colon: Secondary | ICD-10-CM

## 2024-03-02 DIAGNOSIS — Z85038 Personal history of other malignant neoplasm of large intestine: Secondary | ICD-10-CM

## 2024-03-02 DIAGNOSIS — R12 Heartburn: Secondary | ICD-10-CM

## 2024-03-02 DIAGNOSIS — K5904 Chronic idiopathic constipation: Secondary | ICD-10-CM

## 2024-03-02 DIAGNOSIS — R140 Abdominal distension (gaseous): Secondary | ICD-10-CM

## 2024-03-02 DIAGNOSIS — Z9049 Acquired absence of other specified parts of digestive tract: Secondary | ICD-10-CM

## 2024-03-02 MED ORDER — SODIUM CHLORIDE 0.9 % IV SOLN: 500.0000 mL | Freq: Once | INTRAVENOUS | Status: DC

## 2024-03-02 NOTE — Progress Notes (Signed)
 Called to room to assist during endoscopic procedure.  Patient ID and intended procedure confirmed with present staff. Received instructions for my participation in the procedure from the performing physician.

## 2024-03-02 NOTE — Patient Instructions (Signed)

## 2024-03-02 NOTE — Op Note (Signed)
 [facility] Patient Name: Jared Hart Procedure Date: 03/02/2024 12:15 PM MRN: [id number] Endoscopist: Maren Beach , MD, [id number] Age: 58 Referring MD:  Date of Birth: 1966/07/24 Gender: Male Account #: [id number] Procedure:                Colonoscopy Indications:              High risk colon cancer surveillance: Personal                            history of colon cancer status post right colectomy                            at age 70. Patient has not had a follow-up                            colonoscopy since that time. Reports symptoms of                            constipation. Medicines:                Monitored Anesthesia Care Procedure:                Pre-Anesthesia Assessment:                           - Prior to the procedure, a History and Physical                            was performed, and patient medications and                            allergies were reviewed. The patient's tolerance of                            previous anesthesia was also reviewed. The risks                            and benefits of the procedure and the sedation                            options and risks were discussed with the patient.                            All questions were answered, and informed consent                            was obtained. Prior Anticoagulants: The patient has                            taken no anticoagulant or antiplatelet agents. ASA                            Grade Assessment: III - A patient with severe  systemic disease. After reviewing the risks and                            benefits, the patient was deemed in satisfactory                            condition to undergo the procedure.                           After obtaining informed consent, the colonoscope                            was passed under direct vision. Throughout the                            procedure, the patient's blood pressure, pulse, and                             oxygen saturations were monitored continuously. The                            CF [id number] #[id number] was introduced through the anus                            and advanced to the ileocolonic anastomosis. The                            colonoscopy was performed without difficulty. The                            patient tolerated the procedure well. The quality                            of the bowel preparation was fair. Ileocolonic                            anastomosis were photographed. Scope In: 12:26:31 PM Scope Out: 12:48:22 PM Scope Withdrawal Time: 0 hours 18 minutes 18 seconds  Total Procedure Duration: 0 hours 21 minutes 51 seconds  Findings:                 The perianal and digital rectal examinations were                            normal. Pertinent negatives include normal                            sphincter tone and no palpable rectal lesions.                           A moderate amount of semi-liquid stool was found in                            the descending colon and in the transverse colon.  Lavage of the area was performed using a large                            amount of sterile water, resulting in incomplete                            clearance with continued poor visualization.                           There was evidence of a prior ileo-colonic                            anastomosis in the proximal transverse colon. This                            was patent and was characterized by healthy                            appearing mucosa. The anastomosis was traversed.                           The neo-terminal ileum appeared normal.                           Two sessile polyps were found in the rectum and                            descending colon. The polyps were 4 to 5 mm in                            size. These polyps were removed with a cold snare.                            Resection and retrieval were complete.                            Internal hemorrhoids were found during retroflexion. Complications:            No immediate complications. Estimated blood loss:                            Minimal. Estimated Blood Loss:     Estimated blood loss was minimal. Impression:               - Preparation of the colon was fair.                           - Stool in the descending colon and in the                            transverse colon.                           - Patent ileo-colonic anastomosis, characterized by  healthy appearing mucosa.                           - The examined portion of the ileum was normal.                           - Two 4 to 5 mm polyps in the rectum and in the                            descending colon, removed with a cold snare.                            Resected and retrieved.                           - Internal hemorrhoids. Recommendation:           - Discharge patient to home (ambulatory).                           - Repeat colonoscopy in 1 year for surveillance.                            Patient will need a two day prep again as well as                            an additional laxative regimen leading up to                            colonoscopy in order to improve quality of bowel                            preparation.                           - The findings and recommendations were discussed                            with the patient's family.                           - Return to GI clinic in 2-3 months to review                            constipation and coordinate maintenance laxative                            regimen.                           - The findings and recommendations were discussed                            with the patient's family.                           -  Patient has a contact number available for                            emergencies. The signs and symptoms of potential                            delayed  complications were discussed with the                            patient. Return to normal activities tomorrow.                            Written discharge instructions were provided to the                            patient. Maren Beach, MD 03/02/2024 12:55:23 PM This report has been signed electronically.

## 2024-03-02 NOTE — Progress Notes (Signed)
 Pt's states no medical or surgical changes since previsit or office visit.

## 2024-03-02 NOTE — Progress Notes (Signed)
 Vss nad trans to pacu

## 2024-03-03 ENCOUNTER — Telehealth: Payer: Self-pay

## 2024-03-03 NOTE — Telephone Encounter (Signed)
  Follow up Call-     03/02/2024   11:27 AM  Call back number  Post procedure Call Back phone  # [phone]  Permission to leave phone message Yes   Follow up call, LVM

## 2024-03-04 LAB — SURGICAL PATHOLOGY

## 2024-03-07 ENCOUNTER — Encounter: Payer: Self-pay | Admitting: Pediatrics

## 2024-03-08 ENCOUNTER — Telehealth: Payer: Self-pay

## 2024-03-08 NOTE — Telephone Encounter (Signed)
 Monday, 04/12/24 at 10 am with [facility], NP. Appt information sent to patient via MyChart and mailed today.

## 2024-03-08 NOTE — Telephone Encounter (Signed)
-----   Message from Ottie Glazier sent at 03/07/2024 11:55 AM EDT ----- Regarding: Return Clinic Visit Please schedule Mr. Kandel for a follow-up clinic visit with me or an APP in 1-2 months to discuss his symptoms of constipation and further treatment.  Thanks,  Harriett Sine

## 2024-03-10 ENCOUNTER — Other Ambulatory Visit: Payer: Self-pay

## 2024-03-10 ENCOUNTER — Other Ambulatory Visit: Payer: Self-pay | Admitting: Nurse Practitioner

## 2024-03-10 DIAGNOSIS — N529 Male erectile dysfunction, unspecified: Secondary | ICD-10-CM

## 2024-03-10 MED ORDER — TADALAFIL 20 MG PO TABS
20.0000 mg | ORAL_TABLET | Freq: Every day | ORAL | 0 refills | Status: DC | PRN
  Filled 2024-03-10: qty 10, 10d supply, fill #0

## 2024-03-10 NOTE — Telephone Encounter (Signed)
 Please advise [facility]

## 2024-03-12 ENCOUNTER — Other Ambulatory Visit: Payer: Self-pay

## 2024-03-16 ENCOUNTER — Other Ambulatory Visit (HOSPITAL_COMMUNITY): Payer: Self-pay

## 2024-03-16 MED ORDER — BENZONATATE 100 MG PO CAPS
100.0000 mg | ORAL_CAPSULE | Freq: Three times a day (TID) | ORAL | 0 refills | Status: DC | PRN
  Filled 2024-03-16: qty 30, 5d supply, fill #0

## 2024-03-16 MED ORDER — CETIRIZINE HCL 10 MG PO TABS
10.0000 mg | ORAL_TABLET | Freq: Every day | ORAL | 0 refills | Status: DC | PRN
  Filled 2024-03-16: qty 30, 30d supply, fill #0

## 2024-03-16 MED ORDER — FLUTICASONE PROPIONATE 50 MCG/ACT NA SUSP
2.0000 | Freq: Every day | NASAL | 0 refills | Status: AC
Start: 2024-03-16 — End: ?
  Filled 2024-03-16: qty 16, 30d supply, fill #0

## 2024-03-18 ENCOUNTER — Emergency Department (HOSPITAL_COMMUNITY)

## 2024-03-18 ENCOUNTER — Emergency Department (HOSPITAL_COMMUNITY)
Admission: EM | Admit: 2024-03-18 | Discharge: 2024-03-19 | Discharge status: Against Medical Advice | Attending: Emergency Medicine | Admitting: Emergency Medicine

## 2024-03-18 ENCOUNTER — Emergency Department (HOSPITAL_BASED_OUTPATIENT_CLINIC_OR_DEPARTMENT_OTHER)

## 2024-03-18 ENCOUNTER — Encounter (HOSPITAL_COMMUNITY): Payer: Self-pay

## 2024-03-18 DIAGNOSIS — Z5321 Procedure and treatment not carried out due to patient leaving prior to being seen by health care provider: Secondary | ICD-10-CM | POA: Diagnosis not present

## 2024-03-18 DIAGNOSIS — R2241 Localized swelling, mass and lump, right lower limb: Secondary | ICD-10-CM | POA: Diagnosis not present

## 2024-03-18 DIAGNOSIS — Z85038 Personal history of other malignant neoplasm of large intestine: Secondary | ICD-10-CM | POA: Insufficient documentation

## 2024-03-18 DIAGNOSIS — R0981 Nasal congestion: Secondary | ICD-10-CM | POA: Diagnosis not present

## 2024-03-18 DIAGNOSIS — R0602 Shortness of breath: Secondary | ICD-10-CM | POA: Insufficient documentation

## 2024-03-18 DIAGNOSIS — M7989 Other specified soft tissue disorders: Secondary | ICD-10-CM

## 2024-03-18 DIAGNOSIS — R059 Cough, unspecified: Secondary | ICD-10-CM | POA: Diagnosis not present

## 2024-03-18 LAB — CBC WITH DIFFERENTIAL/PLATELET
Abs Immature Granulocytes: 0.11 10*3/uL — ABNORMAL HIGH (ref 0.00–0.07)
Basophils Absolute: 0 10*3/uL (ref 0.0–0.1)
Basophils Relative: 1 %
Eosinophils Absolute: 0.2 10*3/uL (ref 0.0–0.5)
Eosinophils Relative: 3 %
HCT: 32.8 % — ABNORMAL LOW (ref 39.0–52.0)
Hemoglobin: 11.1 g/dL — ABNORMAL LOW (ref 13.0–17.0)
Immature Granulocytes: 2 %
Lymphocytes Relative: 27 %
Lymphs Abs: 2 10*3/uL (ref 0.7–4.0)
MCH: 28.9 pg (ref 26.0–34.0)
MCHC: 33.8 g/dL (ref 30.0–36.0)
MCV: 85.4 fL (ref 80.0–100.0)
Monocytes Absolute: 0.4 10*3/uL (ref 0.1–1.0)
Monocytes Relative: 6 %
Neutro Abs: 4.5 10*3/uL (ref 1.7–7.7)
Neutrophils Relative %: 61 %
Platelets: 293 10*3/uL (ref 150–400)
RBC: 3.84 MIL/uL — ABNORMAL LOW (ref 4.22–5.81)
RDW: 12.6 % (ref 11.5–15.5)
WBC: 7.2 10*3/uL (ref 4.0–10.5)
nRBC: 0 % (ref 0.0–0.2)

## 2024-03-18 LAB — BASIC METABOLIC PANEL
Anion gap: 10 (ref 5–15)
BUN: 13 mg/dL (ref 6–20)
CO2: 25 mmol/L (ref 22–32)
Calcium: 9 mg/dL (ref 8.9–10.3)
Chloride: 99 mmol/L (ref 98–111)
Creatinine, Ser: 0.99 mg/dL (ref 0.61–1.24)
GFR, Estimated: >60 mL/min (ref >60)
Glucose, Bld: 297 mg/dL — ABNORMAL HIGH (ref 70–99)
Potassium: 4.1 mmol/L (ref 3.5–5.1)
Sodium: 134 mmol/L — ABNORMAL LOW (ref 135–145)

## 2024-03-18 LAB — BRAIN NATRIURETIC PEPTIDE: B Natriuretic Peptide: 51.2 pg/mL (ref 0.0–100.0)

## 2024-03-18 LAB — RESP PANEL BY RT-PCR (RSV, FLU A&B, COVID)  RVPGX2
Influenza A by PCR: NEGATIVE
Influenza B by PCR: NEGATIVE
Resp Syncytial Virus by PCR: NEGATIVE
SARS Coronavirus 2 by RT PCR: NEGATIVE

## 2024-03-18 LAB — D-DIMER, QUANTITATIVE: D-Dimer, Quant: <0.27 ug{FEU}/mL (ref 0.00–0.50)

## 2024-03-18 NOTE — ED Provider Triage Note (Signed)
 Emergency Medicine Provider Triage Evaluation Note  Jared Hart , a 58 y.o. male  was evaluated in triage.  Pt complains of sob. Endorse congestion and sob x 2 weeks.  Occasional cough.  Also report RLE swelling x 1 month.  Hx of colon cancer.  No pain, no fever, chills.  No hx of PE/DVT  Review of Systems  Positive: As above Negative: As above  Physical Exam  BP 132/70   Pulse 65   Temp 97.6 F (36.4 C)   Resp 18   Ht 6\' 1"  (1.854 m)   Wt 118.8 kg   SpO2 94%   BMI 34.57 kg/m  Gen:   Awake, no distress   Resp:  Normal effort  MSK:   Moves extremities without difficulty  Other:    Medical Decision Making  Medically screening exam initiated at 2:42 PM.  Appropriate orders placed.  Jared Hart was informed that the remainder of the evaluation will be completed by another provider, this initial triage assessment does not replace that evaluation, and the importance of remaining in the ED until their evaluation is complete.     Fayrene Helper, PA-C 03/18/24 1444

## 2024-03-18 NOTE — ED Notes (Signed)
 Pt stated they no longer wanted to wait and left. Informed pt if anything changes or gets worse to come back.

## 2024-03-18 NOTE — ED Triage Notes (Addendum)
 Pt came in via POV d/t the past 3 weeks having nasal congestion, itchy throat & SOB. Also reports Rt leg is swollen for the past couple of months as well. Also endorses difficulty keeping regular with bowel movements, last BM 2 days ago.

## 2024-03-19 ENCOUNTER — Other Ambulatory Visit: Payer: Self-pay

## 2024-03-19 ENCOUNTER — Ambulatory Visit: Payer: Self-pay

## 2024-03-19 ENCOUNTER — Telehealth: Admitting: Physician Assistant

## 2024-03-19 ENCOUNTER — Other Ambulatory Visit (HOSPITAL_COMMUNITY): Payer: Self-pay

## 2024-03-19 DIAGNOSIS — B9689 Other specified bacterial agents as the cause of diseases classified elsewhere: Secondary | ICD-10-CM | POA: Diagnosis not present

## 2024-03-19 DIAGNOSIS — J019 Acute sinusitis, unspecified: Secondary | ICD-10-CM | POA: Diagnosis not present

## 2024-03-19 MED ORDER — AMOXICILLIN-POT CLAVULANATE 875-125 MG PO TABS
1.0000 | ORAL_TABLET | Freq: Two times a day (BID) | ORAL | 0 refills | Status: DC
  Filled 2024-03-19: qty 14, 7d supply, fill #0

## 2024-03-19 NOTE — Telephone Encounter (Signed)
  Chief Complaint: Sinus Congestion Symptoms: green nasal discharge Frequency: couple of weeks ago Pertinent Negatives: Patient denies fever, CP, SOB Disposition: [] ED /[] Urgent Care (no appt availability in office) / [] Appointment(In office/virtual)/ [x]  [facility]/ [] Home Care/ [] Refused Recommended Disposition /[] [facility]/ []  Follow-up with PCP Additional Notes: patient called with complaints of sinus congestion, headache and green nasal discharge. Patient states he can't get an answer of why these symptoms have continued. Unable to make an appointment with PCP. Patient agreed to virtual care UC. Appointment made for patient for today at 4:00 pm. Instructions were given to patient of how to get onto the appointment. Patient verbalized understanding of plan and all questions answered.    Summary: Possible Rhinosinusitis, no appt available   Copied From CRM [id number]. Reason for Triage: Pt called reporting possible Rhinosinusitis symptoms, seeking appt nothing available today with PCP  Best contact: [phone]     Reason for Disposition  [1] SEVERE pain AND [2] not improved 2 hours after pain medicine  Answer Assessment - Initial Assessment Questions 1. LOCATION: "Where does it hurt?"      nose 2. ONSET: "When did the sinus pain start?"  (e.g., hours, days)      Couple of weeks ago 3. SEVERITY: "How bad is the pain?"   (Scale 1-10; mild, moderate or severe)   - MILD (1-3): doesn't interfere with normal activities    - MODERATE (4-7): interferes with normal activities (e.g., work or school) or awakens from sleep   - SEVERE (8-10): excruciating pain and patient unable to do any normal activities        Moderate 4. RECURRENT SYMPTOM: "Have you ever had sinus problems before?" If Yes, ask: "When was the last time?" and "What happened that time?"      yes 5. NASAL CONGESTION: "Is the nose blocked?" If Yes, ask: "Can you open it or must you breathe through your  mouth?"     yes 6. NASAL DISCHARGE: "Do you have discharge from your nose?" If so ask, "What color?"     Green 7. FEVER: "Do you have a fever?" If Yes, ask: "What is it, how was it measured, and when did it start?"      no 8. OTHER SYMPTOMS: "Do you have any other symptoms?" (e.g., sore throat, cough, earache, difficulty breathing)     No  Protocols used: Sinus Pain or Congestion-A-AH

## 2024-03-19 NOTE — Patient Instructions (Signed)
 Jared Hart, thank you for joining Margaretann Loveless, PA-C for today's virtual visit.  While this provider is not your primary care provider (PCP), if your PCP is located in our provider database this encounter information will be shared with them immediately following your visit.   A [facility] MyChart account gives you access to today's visit and all your visits, tests, and labs performed at [facility] " click here if you don't have a [facility] MyChart account or go to [email or web address]  Consent: (Patient) Jared Hart provided verbal consent for this virtual visit at the beginning of the encounter.  Current Medications:  Current Outpatient Medications:    amoxicillin-clavulanate (AUGMENTIN) 875-125 MG tablet, Take 1 tablet by mouth 2 (two) times daily., Disp: 14 tablet, Rfl: 0   albuterol (VENTOLIN HFA) 108 (90 Base) MCG/ACT inhaler, Inhale 2 puffs by mouth every 6hrs as needed for cough and shortness of breath, Disp: 18 g, Rfl: 0   amLODipine (NORVASC) 10 MG tablet, Take 1 tablet (10 mg total) by mouth daily., Disp: 90 tablet, Rfl: 3   atorvastatin (LIPITOR) 80 MG tablet, Take 1 tablet (80 mg total) by mouth daily., Disp: 90 tablet, Rfl: 1   benzonatate (TESSALON) 100 MG capsule, Take 1-2 capsules (100-200 mg total) by mouth 3 (three) times daily as needed., Disp: 30 capsule, Rfl: 0   blood glucose meter kit and supplies, Dispense based on patient and insurance preference. Use up to four times daily as directed. (FOR ICD-10 E10.9, E11.9)., Disp: 1 each, Rfl: 0   Blood Pressure KIT, 1 each by Does not apply route daily., Disp: 1 kit, Rfl: 0   carvedilol (COREG) 25 MG tablet, Take 1 tablet (25 mg total) by mouth 2 (two) times daily with a meal., Disp: 180 tablet, Rfl: 1   cetirizine (ZYRTEC) 10 MG tablet, Take 1 tablet (10 mg total) by mouth daily as needed., Disp: 30 tablet, Rfl: 0   docusate sodium (COLACE) 100 MG capsule, Take 1 capsule (100 mg total) by mouth  daily., Disp: 30 capsule, Rfl: 6   fluticasone (FLONASE) 50 MCG/ACT nasal spray, Place 2 sprays into both nostrils daily., Disp: 16 g, Rfl: 0   furosemide (LASIX) 40 MG tablet, Take 1 tablet (40 mg total) by mouth daily., Disp: 40 tablet, Rfl: 2   gabapentin (NEURONTIN) 100 MG capsule, Take 1 capsule (100 mg total) by mouth 2 (two) times daily., Disp: 60 capsule, Rfl: 2   glipiZIDE (GLUCOTROL) 10 MG tablet, Take 1 tablet (10 mg total) by mouth 2 (two) times daily with a meal., Disp: 180 tablet, Rfl: 1   ibuprofen (ADVIL) 200 MG tablet, Take 400 mg by mouth every 6 (six) hours as needed for headache or mild pain. (Patient not taking: Reported on 03/02/2024), Disp: , Rfl:    insulin glargine (LANTUS SOLOSTAR) 100 UNIT/ML Solostar Pen, Inject 28 Units into the skin at bedtime., Disp: 15 mL, Rfl: 1   Insulin Pen Needle (ULTICARE MINI PEN NEEDLES) 31G X 6 MM MISC, Use as directed, Disp: 100 each, Rfl: 12   levETIRAcetam (KEPPRA) 500 MG tablet, Take 1 tablet (500 mg total) by mouth 2 (two) times daily. (Patient not taking: Reported on 03/02/2024), Disp: 60 tablet, Rfl: 11   linaclotide (LINZESS) 145 MCG CAPS capsule, Take 1 capsule (145 mcg total) by mouth daily before breakfast., Disp: 8 capsule, Rfl: 0   losartan (COZAAR) 25 MG tablet, Take 1 tablet (25 mg total) by mouth daily., Disp: 30 tablet, Rfl:  6   metFORMIN (GLUCOPHAGE-XR) 500 MG 24 hr tablet, Take 2 tablets (1,000 mg total) by mouth 2 (two) times daily with a meal., Disp: 180 tablet, Rfl: 1   Multiple Vitamin (MULTIVITAMIN WITH MINERALS) TABS tablet, Take 1 tablet by mouth daily with breakfast. (Patient not taking: Reported on 03/02/2024), Disp: , Rfl:    omega-3 acid ethyl esters (LOVAZA) 1 g capsule, Take 1 capsule (1 g total) by mouth 2 (two) times daily., Disp: 60 capsule, Rfl: 2   senna (SENOKOT) 8.6 MG tablet, Take 1 tablet (8.6 mg total) by mouth 2 (two) times daily., Disp: 60 tablet, Rfl: 2   spironolactone (ALDACTONE) 25 MG tablet, Take 1  tablet (25 mg total) by mouth daily., Disp: 30 tablet, Rfl: 2   tadalafil (CIALIS) 20 MG tablet, Take 1 tablet (20 mg total) by mouth daily as needed for erectile dysfunction., Disp: 10 tablet, Rfl: 0  Current Facility-Administered Medications:    Insulin Pen Needle (NOVOFINE) 10 each, 1 packet, Subcutaneous, PRN, Henrietta Hoover, NP   Medications ordered in this encounter:  Meds ordered this encounter  Medications   amoxicillin-clavulanate (AUGMENTIN) 875-125 MG tablet    Sig: Take 1 tablet by mouth 2 (two) times daily.    Dispense:  14 tablet    Refill:  0    Supervising Provider:   Merrilee Jansky [[id number]]     *If you need refills on other medications prior to your next appointment, please contact your pharmacy*  Follow-Up: Call back or seek an in-person evaluation if the symptoms worsen or if the condition fails to improve as anticipated.  [facility] [phone]  Other Instructions Sinus Infection, Adult A sinus infection, also called sinusitis, is inflammation of your sinuses. Sinuses are hollow spaces in the bones around your face. Your sinuses are located: Around your eyes. In the middle of your forehead. Behind your nose. In your cheekbones. Mucus normally drains out of your sinuses. When your nasal tissues become inflamed or swollen, mucus can become trapped or blocked. This allows bacteria, viruses, and fungi to grow, which leads to infection. Most infections of the sinuses are caused by a virus. A sinus infection can develop quickly. It can last for up to 4 weeks (acute) or for more than 12 weeks (chronic). A sinus infection often develops after a cold. What are the causes? This condition is caused by anything that creates swelling in the sinuses or stops mucus from draining. This includes: Allergies. Asthma. Infection from bacteria or viruses. Deformities or blockages in your nose or sinuses. Abnormal growths in the nose (nasal  polyps). Pollutants, such as chemicals or irritants in the air. Infection from fungi. This is rare. What increases the risk? You are more likely to develop this condition if you: Have a weak body defense system (immune system). Do a lot of swimming or diving. Overuse nasal sprays. Smoke. What are the signs or symptoms? The main symptoms of this condition are pain and a feeling of pressure around the affected sinuses. Other symptoms include: Stuffy nose or congestion that makes it difficult to breathe through your nose. Thick yellow or greenish drainage from your nose. Tenderness, swelling, and warmth over the affected sinuses. A cough that may get worse at night. Decreased sense of smell and taste. Extra mucus that collects in the throat or the back of the nose (postnasal drip) causing a sore throat or bad breath. Tiredness (fatigue). Fever. How is this diagnosed? This condition is diagnosed based  on: Your symptoms. Your medical history. A physical exam. Tests to find out if your condition is acute or chronic. This may include: Checking your nose for nasal polyps. Viewing your sinuses using a device that has a light (endoscope). Testing for allergies or bacteria. Imaging tests, such as an MRI or CT scan. In rare cases, a bone biopsy may be done to rule out more serious types of fungal sinus disease. How is this treated? Treatment for a sinus infection depends on the cause and whether your condition is chronic or acute. If caused by a virus, your symptoms should go away on their own within 10 days. You may be given medicines to relieve symptoms. They include: Medicines that shrink swollen nasal passages (decongestants). A spray that eases inflammation of the nostrils (topical intranasal corticosteroids). Rinses that help get rid of thick mucus in your nose (nasal saline washes). Medicines that treat allergies (antihistamines). Over-the-counter pain relievers. If caused by  bacteria, your health care provider may recommend waiting to see if your symptoms improve. Most bacterial infections will get better without antibiotic medicine. You may be given antibiotics if you have: A severe infection. A weak immune system. If caused by narrow nasal passages or nasal polyps, surgery may be needed. Follow these instructions at home: Medicines Take, use, or apply over-the-counter and prescription medicines only as told by your health care provider. These may include nasal sprays. If you were prescribed an antibiotic medicine, take it as told by your health care provider. Do not stop taking the antibiotic even if you start to feel better. Hydrate and humidify  Drink enough fluid to keep your urine pale yellow. Staying hydrated will help to thin your mucus. Use a cool mist humidifier to keep the humidity level in your home above 50%. Inhale steam for 10-15 minutes, 3-4 times a day, or as told by your health care provider. You can do this in the bathroom while a hot shower is running. Limit your exposure to cool or dry air. Rest Rest as much as possible. Sleep with your head raised (elevated). Make sure you get enough sleep each night. General instructions  Apply a warm, moist washcloth to your face 3-4 times a day or as told by your health care provider. This will help with discomfort. Use nasal saline washes as often as told by your health care provider. Wash your hands often with soap and water to reduce your exposure to germs. If soap and water are not available, use hand sanitizer. Do not smoke. Avoid being around people who are smoking (secondhand smoke). Keep all follow-up visits. This is important. Contact a health care provider if: You have a fever. Your symptoms get worse. Your symptoms do not improve within 10 days. Get help right away if: You have a severe headache. You have persistent vomiting. You have severe pain or swelling around your face or  eyes. You have vision problems. You develop confusion. Your neck is stiff. You have trouble breathing. These symptoms may be an emergency. Get help right away. Call 911. Do not wait to see if the symptoms will go away. Do not drive yourself to the hospital. Summary A sinus infection is soreness and inflammation of your sinuses. Sinuses are hollow spaces in the bones around your face. This condition is caused by nasal tissues that become inflamed or swollen. The swelling traps or blocks the flow of mucus. This allows bacteria, viruses, and fungi to grow, which leads to infection. If you were prescribed  an antibiotic medicine, take it as told by your health care provider. Do not stop taking the antibiotic even if you start to feel better. Keep all follow-up visits. This is important. This information is not intended to replace advice given to you by your health care provider. Make sure you discuss any questions you have with your health care provider. Document Revised: 11/20/2021 Document Reviewed: 11/20/2021 Elsevier Patient Education  2024 [employer].   If you have been instructed to have an in-person evaluation today at a local Urgent Care facility, please use the link below. It will take you to a list of all of our available [facility], including address, phone number and hours of operation. Please do not delay care.  [facility]  If you or a family member do not have a primary care provider, use the link below to schedule a visit and establish care. When you choose a [facility] primary care physician or advanced practice provider, you gain a long-term partner in health. Find a Primary Care Provider  Learn more about [facility] in-office and virtual care options: [facility] - Get Care Now

## 2024-03-19 NOTE — Progress Notes (Signed)
 Virtual Visit Consent   Jared Hart, you are scheduled for a virtual visit with a [facility] provider today. Just as with appointments in the office, your consent must be obtained to participate. Your consent will be active for this visit and any virtual visit you may have with one of our providers in the next 365 days. If you have a MyChart account, a copy of this consent can be sent to you electronically.  As this is a virtual visit, video technology does not allow for your provider to perform a traditional examination. This may limit your provider's ability to fully assess your condition. If your provider identifies any concerns that need to be evaluated in person or the need to arrange testing (such as labs, EKG, etc.), we will make arrangements to do so. Although advances in technology are sophisticated, we cannot ensure that it will always work on either your end or our end. If the connection with a video visit is poor, the visit may have to be switched to a telephone visit. With either a video or telephone visit, we are not always able to ensure that we have a secure connection.  By engaging in this virtual visit, you consent to the provision of healthcare and authorize for your insurance to be billed (if applicable) for the services provided during this visit. Depending on your insurance coverage, you may receive a charge related to this service.  I need to obtain your verbal consent now. Are you willing to proceed with your visit today? Jared Hart has provided verbal consent on 03/19/2024 for a virtual visit (video or telephone). Margaretann Loveless, PA-C  Date: 03/19/2024 4:34 PM   Virtual Visit via Video Note   I, Margaretann Loveless, connected with  Jared Hart  ([id number], [birth year]) on 03/19/24 at  4:00 PM EDT by a video-enabled telemedicine application and verified that I am speaking with the correct person using two identifiers.  Location: Patient: Virtual Visit Location  Patient: Home Provider: Virtual Visit Location Provider: Home Office   I discussed the limitations of evaluation and management by telemedicine and the availability of in person appointments. The patient expressed understanding and agreed to proceed.    History of Present Illness: Jared Hart is a 58 y.o. who identifies as a male who was assigned male at birth, and is being seen today for sinus congestion.  HPI: Sinusitis This is a new problem. The current episode started 1 to 4 weeks ago (2-3 weeks). The problem has been gradually worsening since onset. There has been no fever. The pain is moderate. Associated symptoms include congestion, coughing, headaches, sinus pressure and a sore throat (scratchy throat from post nasal drainage). Pertinent negatives include no chills or ear pain. Treatments tried: zyrtec, flonase, coricidin. The treatment provided no relief.    Went to [facility] on 03/16/24. Had negative Covid, Flu, Strep.  Then seen at [facility] ER yesterday, 03/18/24, and had negative RSV, Covid, Flu, labs unremarkable, Right LE Venous duplex negative for clot, and negative CXR. Left due to wait time over 11 hours.   Problems:  Patient Active Problem List   Diagnosis Date Noted   Colon cancer screening 04/09/2023   Type 2 diabetes mellitus with complication, without long-term current use of insulin (HCC) 09/11/2022   Cerebrovascular accident (CVA) due to occlusion of right carotid artery (HCC) 06/06/2022   History of colon cancer 06/06/2022   Tremor 06/06/2022   Proteinuria 01/23/2020   Heel pain, chronic,  left 09/20/2019   Elevated hemoglobin A1c 05/17/2019   Hemoglobin A1C greater than 9%, indicating poor diabetic control 05/17/2019   Constipation 05/17/2019   Hyperlipidemia 07/09/2016   Acute CVA (cerebrovascular accident) (HCC) 07/09/2016   Cerebrovascular accident (CVA) due to occlusion of precerebral artery (HCC)    Stroke-like symptom 07/08/2016   TIA (transient ischemic  attack) 07/08/2016   Chronic combined systolic (congestive) and diastolic (congestive) heart failure (HCC) 07/08/2016   Fever 04/02/2015   Bronchiolitis 04/02/2015   Type 2 diabetes mellitus with hyperglycemia (HCC) 04/02/2015   Hypokalemia 04/02/2015   Febrile illness, acute    Fatigue 09/28/2012   Acute exacerbation of CHF (congestive heart failure) (HCC) 09/19/2012   VARICOSE VEINS, LOWER EXTREMITIES 08/24/2010   Obesity 06/29/2010   SYSTOLIC HEART FAILURE, CHRONIC 06/01/2010   Malignant neoplasm of colon (HCC) 05/30/2010   HYPERLIPIDEMIA 05/30/2010   HYPERTENSION, BENIGN ESSENTIAL 05/30/2010   CARDIOMYOPATHY 05/13/2010   CHF 05/13/2010   Type 2 diabetes mellitus (HCC) 12/30/1998    Allergies: No Known Allergies Medications:  Current Outpatient Medications:    amoxicillin-clavulanate (AUGMENTIN) 875-125 MG tablet, Take 1 tablet by mouth 2 (two) times daily., Disp: 14 tablet, Rfl: 0   albuterol (VENTOLIN HFA) 108 (90 Base) MCG/ACT inhaler, Inhale 2 puffs by mouth every 6hrs as needed for cough and shortness of breath, Disp: 18 g, Rfl: 0   amLODipine (NORVASC) 10 MG tablet, Take 1 tablet (10 mg total) by mouth daily., Disp: 90 tablet, Rfl: 3   atorvastatin (LIPITOR) 80 MG tablet, Take 1 tablet (80 mg total) by mouth daily., Disp: 90 tablet, Rfl: 1   benzonatate (TESSALON) 100 MG capsule, Take 1-2 capsules (100-200 mg total) by mouth 3 (three) times daily as needed., Disp: 30 capsule, Rfl: 0   blood glucose meter kit and supplies, Dispense based on patient and insurance preference. Use up to four times daily as directed. (FOR ICD-10 E10.9, E11.9)., Disp: 1 each, Rfl: 0   Blood Pressure KIT, 1 each by Does not apply route daily., Disp: 1 kit, Rfl: 0   carvedilol (COREG) 25 MG tablet, Take 1 tablet (25 mg total) by mouth 2 (two) times daily with a meal., Disp: 180 tablet, Rfl: 1   cetirizine (ZYRTEC) 10 MG tablet, Take 1 tablet (10 mg total) by mouth daily as needed., Disp: 30 tablet,  Rfl: 0   docusate sodium (COLACE) 100 MG capsule, Take 1 capsule (100 mg total) by mouth daily., Disp: 30 capsule, Rfl: 6   fluticasone (FLONASE) 50 MCG/ACT nasal spray, Place 2 sprays into both nostrils daily., Disp: 16 g, Rfl: 0   furosemide (LASIX) 40 MG tablet, Take 1 tablet (40 mg total) by mouth daily., Disp: 40 tablet, Rfl: 2   gabapentin (NEURONTIN) 100 MG capsule, Take 1 capsule (100 mg total) by mouth 2 (two) times daily., Disp: 60 capsule, Rfl: 2   glipiZIDE (GLUCOTROL) 10 MG tablet, Take 1 tablet (10 mg total) by mouth 2 (two) times daily with a meal., Disp: 180 tablet, Rfl: 1   ibuprofen (ADVIL) 200 MG tablet, Take 400 mg by mouth every 6 (six) hours as needed for headache or mild pain. (Patient not taking: Reported on 03/02/2024), Disp: , Rfl:    insulin glargine (LANTUS SOLOSTAR) 100 UNIT/ML Solostar Pen, Inject 28 Units into the skin at bedtime., Disp: 15 mL, Rfl: 1   Insulin Pen Needle (ULTICARE MINI PEN NEEDLES) 31G X 6 MM MISC, Use as directed, Disp: 100 each, Rfl: 12   levETIRAcetam (KEPPRA) 500  MG tablet, Take 1 tablet (500 mg total) by mouth 2 (two) times daily. (Patient not taking: Reported on 03/02/2024), Disp: 60 tablet, Rfl: 11   linaclotide (LINZESS) 145 MCG CAPS capsule, Take 1 capsule (145 mcg total) by mouth daily before breakfast., Disp: 8 capsule, Rfl: 0   losartan (COZAAR) 25 MG tablet, Take 1 tablet (25 mg total) by mouth daily., Disp: 30 tablet, Rfl: 6   metFORMIN (GLUCOPHAGE-XR) 500 MG 24 hr tablet, Take 2 tablets (1,000 mg total) by mouth 2 (two) times daily with a meal., Disp: 180 tablet, Rfl: 1   Multiple Vitamin (MULTIVITAMIN WITH MINERALS) TABS tablet, Take 1 tablet by mouth daily with breakfast. (Patient not taking: Reported on 03/02/2024), Disp: , Rfl:    omega-3 acid ethyl esters (LOVAZA) 1 g capsule, Take 1 capsule (1 g total) by mouth 2 (two) times daily., Disp: 60 capsule, Rfl: 2   senna (SENOKOT) 8.6 MG tablet, Take 1 tablet (8.6 mg total) by mouth 2 (two)  times daily., Disp: 60 tablet, Rfl: 2   spironolactone (ALDACTONE) 25 MG tablet, Take 1 tablet (25 mg total) by mouth daily., Disp: 30 tablet, Rfl: 2   tadalafil (CIALIS) 20 MG tablet, Take 1 tablet (20 mg total) by mouth daily as needed for erectile dysfunction., Disp: 10 tablet, Rfl: 0  Current Facility-Administered Medications:    Insulin Pen Needle (NOVOFINE) 10 each, 1 packet, Subcutaneous, PRN, Henrietta Hoover, NP  Observations/Objective: Patient is well-developed, well-nourished in no acute distress.  Resting comfortably at home.  Head is normocephalic, atraumatic.  No labored breathing.  Speech is clear and coherent with logical content.  Patient is alert and oriented at baseline.    Assessment and Plan: 1. Acute bacterial sinusitis (Primary) - amoxicillin-clavulanate (AUGMENTIN) 875-125 MG tablet; Take 1 tablet by mouth 2 (two) times daily.  Dispense: 14 tablet; Refill: 0  - Worsening symptoms that have not responded to OTC medications.  - Will give Augmentin - Continue allergy medications.  - Steam and humidifier can help - Stay well hydrated and get plenty of rest.  - Seek in person evaluation if no symptom improvement or if symptoms worsen   Follow Up Instructions: I discussed the assessment and treatment plan with the patient. The patient was provided an opportunity to ask questions and all were answered. The patient agreed with the plan and demonstrated an understanding of the instructions.  A copy of instructions were sent to the patient via MyChart unless otherwise noted below.    The patient was advised to call back or seek an in-person evaluation if the symptoms worsen or if the condition fails to improve as anticipated.    Margaretann Loveless, PA-C

## 2024-03-22 ENCOUNTER — Telehealth: Payer: Self-pay

## 2024-03-22 NOTE — Transitions of Care (Post Inpatient/ED Visit) (Signed)
   03/22/2024  Name: YACOB WILKERSON MRN: [id number] DOB: 03/05/1966  Today's TOC FU Call Status: Today's TOC FU Call Status:: Unsuccessful Call (1st Attempt) Unsuccessful Call (1st Attempt) Date: 03/22/24  Attempted to reach the patient regarding the most recent Inpatient/ED visit.  Follow Up Plan: Additional outreach attempts will be made to reach the patient to complete the Transitions of Care (Post Inpatient/ED visit) call.   Signature  [employer], [place]

## 2024-03-24 ENCOUNTER — Telehealth: Payer: Self-pay

## 2024-03-24 NOTE — Telephone Encounter (Unsigned)
 Copied from CRM [id number]. Topic: Clinical - Medication Question >> Mar 24, 2024 12:27 PM Carlatta H wrote: Reason for CRM: Patient was in the hospital and prescribed an antibiotic but he would like a stronger mediation prescribed//Please call patient to advise

## 2024-03-29 ENCOUNTER — Other Ambulatory Visit: Payer: Self-pay

## 2024-03-29 ENCOUNTER — Other Ambulatory Visit: Payer: Self-pay | Admitting: Nurse Practitioner

## 2024-03-29 ENCOUNTER — Other Ambulatory Visit: Payer: Self-pay | Admitting: *Deleted

## 2024-03-29 DIAGNOSIS — I8390 Asymptomatic varicose veins of unspecified lower extremity: Secondary | ICD-10-CM

## 2024-03-29 DIAGNOSIS — N529 Male erectile dysfunction, unspecified: Secondary | ICD-10-CM

## 2024-03-29 MED ORDER — TADALAFIL 20 MG PO TABS
20.0000 mg | ORAL_TABLET | Freq: Every day | ORAL | 0 refills | Status: DC | PRN
Start: 2024-03-29 — End: 2024-04-19
  Filled 2024-03-29: qty 10, 10d supply, fill #0

## 2024-03-29 MED ORDER — OMEGA-3-ACID ETHYL ESTERS 1 G PO CAPS
1.0000 g | ORAL_CAPSULE | Freq: Two times a day (BID) | ORAL | 2 refills | Status: DC
  Filled 2024-03-29 – 2024-04-01 (×2): qty 60, 30d supply, fill #0

## 2024-04-01 ENCOUNTER — Other Ambulatory Visit: Payer: Self-pay

## 2024-04-08 ENCOUNTER — Ambulatory Visit (INDEPENDENT_AMBULATORY_CARE_PROVIDER_SITE_OTHER): Payer: Medicaid Other | Admitting: Physician Assistant

## 2024-04-08 ENCOUNTER — Ambulatory Visit (HOSPITAL_COMMUNITY)
Admission: RE | Admit: 2024-04-08 | Discharge: 2024-04-08 | Disposition: A | Discharge status: Home / Self Care | Payer: Medicaid Other | Source: Ambulatory Visit | Attending: Vascular Surgery | Admitting: Vascular Surgery

## 2024-04-08 ENCOUNTER — Encounter: Payer: Self-pay | Admitting: Physician Assistant

## 2024-04-08 VITALS — BP 142/87 | HR 69 | Temp 97.7°F | Ht 72.0 in | Wt 268.2 lb

## 2024-04-08 DIAGNOSIS — I8391 Asymptomatic varicose veins of right lower extremity: Secondary | ICD-10-CM

## 2024-04-08 DIAGNOSIS — M7989 Other specified soft tissue disorders: Secondary | ICD-10-CM

## 2024-04-08 DIAGNOSIS — I8390 Asymptomatic varicose veins of unspecified lower extremity: Secondary | ICD-10-CM | POA: Diagnosis present
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
## 2024-04-12 ENCOUNTER — Ambulatory Visit (INDEPENDENT_AMBULATORY_CARE_PROVIDER_SITE_OTHER): Admitting: Gastroenterology

## 2024-04-12 ENCOUNTER — Other Ambulatory Visit: Payer: Self-pay

## 2024-04-12 ENCOUNTER — Encounter: Payer: Self-pay | Admitting: Gastroenterology

## 2024-04-12 VITALS — BP 124/66 | HR 69 | Ht 73.0 in | Wt 268.5 lb

## 2024-04-12 DIAGNOSIS — Z860102 Personal history of hyperplastic colon polyps: Secondary | ICD-10-CM | POA: Diagnosis not present

## 2024-04-12 DIAGNOSIS — K5904 Chronic idiopathic constipation: Secondary | ICD-10-CM

## 2024-04-12 DIAGNOSIS — Z85038 Personal history of other malignant neoplasm of large intestine: Secondary | ICD-10-CM

## 2024-04-12 DIAGNOSIS — Z860101 Personal history of adenomatous and serrated colon polyps: Secondary | ICD-10-CM | POA: Diagnosis not present

## 2024-04-12 DIAGNOSIS — F17290 Nicotine dependence, other tobacco product, uncomplicated: Secondary | ICD-10-CM

## 2024-04-12 MED ORDER — LINACLOTIDE 145 MCG PO CAPS
145.0000 ug | ORAL_CAPSULE | Freq: Every day | ORAL | 3 refills | Status: AC
  Filled 2024-04-12: qty 90, 90d supply, fill #0
  Filled 2024-07-08: qty 90, 90d supply, fill #1
  Filled 2024-08-13: qty 90, 90d supply, fill #2
  Filled 2024-11-16: qty 90, 90d supply, fill #3
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
## 2024-04-12 NOTE — Patient Instructions (Addendum)
 We have sent the following medications to your pharmacy for you to pick up at your convenience: Linzess 145mcg  _______________________________________________________  If your blood pressure at your visit was 140/90 or greater, please contact your primary care physician to follow up on this.  _______________________________________________________  If you are age 56 or older, your body mass index should be between 23-30. Your Body mass index is 35.42 kg/m. If this is out of the aforementioned range listed, please consider follow up with your Primary Care Provider.  If you are age 35 or younger, your body mass index should be between 19-25. Your Body mass index is 35.42 kg/m. If this is out of the aformentioned range listed, please consider follow up with your Primary Care Provider.   ________________________________________________________  The [facility] providers would like to encourage you to use MYCHART to communicate with providers for non-urgent requests or questions.  Due to long hold times on the telephone, sending your provider a message by [facility] may be a faster and more efficient way to get a response.  Please allow 48 business hours for a response.  Please remember that this is for non-urgent requests.  _______________________________________________________ Thank you for trusting me with your gastrointestinal care!   Dyanna Glasgow, CRNP

## 2024-04-19 ENCOUNTER — Other Ambulatory Visit: Payer: Self-pay

## 2024-04-19 ENCOUNTER — Other Ambulatory Visit: Payer: Self-pay | Admitting: Nurse Practitioner

## 2024-04-19 DIAGNOSIS — E118 Type 2 diabetes mellitus with unspecified complications: Secondary | ICD-10-CM

## 2024-04-19 DIAGNOSIS — R7309 Other abnormal glucose: Secondary | ICD-10-CM

## 2024-04-19 DIAGNOSIS — N529 Male erectile dysfunction, unspecified: Secondary | ICD-10-CM

## 2024-04-19 MED ORDER — TADALAFIL 20 MG PO TABS
20.0000 mg | ORAL_TABLET | Freq: Every day | ORAL | 0 refills | Status: DC | PRN
  Filled 2024-04-19: qty 10, 10d supply, fill #0

## 2024-04-19 MED ORDER — LANTUS SOLOSTAR 100 UNIT/ML ~~LOC~~ SOPN
28.0000 [IU] | PEN_INJECTOR | Freq: Every day | SUBCUTANEOUS | 1 refills | Status: DC
  Filled 2024-04-19: qty 15, 53d supply, fill #0
  Filled 2024-06-17: qty 15, 53d supply, fill #1

## 2024-04-20 ENCOUNTER — Other Ambulatory Visit: Payer: Self-pay

## 2024-04-26 ENCOUNTER — Other Ambulatory Visit: Payer: Self-pay

## 2024-04-26 ENCOUNTER — Telehealth: Payer: Self-pay | Admitting: Nurse Practitioner

## 2024-04-26 ENCOUNTER — Telehealth: Payer: Self-pay

## 2024-04-26 ENCOUNTER — Other Ambulatory Visit: Payer: Self-pay | Admitting: Nurse Practitioner

## 2024-04-26 MED ORDER — LIDOCAINE 5 % EX PTCH
1.0000 | MEDICATED_PATCH | CUTANEOUS | 0 refills | Status: AC
  Filled 2024-04-26: qty 10, 10d supply, fill #0

## 2024-04-26 MED ORDER — TIZANIDINE HCL 4 MG PO TABS
4.0000 mg | ORAL_TABLET | Freq: Three times a day (TID) | ORAL | 2 refills | Status: DC | PRN
  Filled 2024-04-26: qty 30, 10d supply, fill #0

## 2024-04-26 NOTE — Telephone Encounter (Signed)
 Sent to [employer]

## 2024-04-26 NOTE — Telephone Encounter (Signed)
 Copied from CRM [id number]. Topic: General - Other >> Apr 26, 2024 11:51 AM [place] wrote: Reason for CRM: Patient returning call to office - no message left-  [phone]

## 2024-04-26 NOTE — Telephone Encounter (Signed)
Pt returned call. Please call him back.

## 2024-04-26 NOTE — Transitions of Care (Post Inpatient/ED Visit) (Signed)
   04/26/2024  Name: Jared Hart MRN: [id number] DOB: Jul 09, 1966  Today's TOC FU Call Status: Today's TOC FU Call Status:: Unsuccessful Call (1st Attempt) Unsuccessful Call (1st Attempt) Date: 04/26/24  Attempted to reach the patient regarding the most recent Inpatient/ED visit.  Follow Up Plan: Additional outreach attempts will be made to reach the patient to complete the Transitions of Care (Post Inpatient/ED visit) call.   Signature  [employer], [place]

## 2024-04-26 NOTE — Transitions of Care (Post Inpatient/ED Visit) (Unsigned)
 04/26/2024  Name: Jared Hart MRN: [id number] DOB: 12-27-1966  Today's TOC FU Call Status:   Patient's Name and Date of Birth confirmed.  Transition Care Management Follow-up Telephone Call    Items Reviewed:    Medications Reviewed Today: Medications Reviewed Today     Reviewed by Angelita Bares, CMA (Certified Medical Assistant) on 04/26/24 at 1540  Med List Status: <None>   Medication Order Taking? Sig Documenting Provider Last Dose Status Informant  amLODipine  (NORVASC ) 10 MG tablet [id number] Yes Take 1 tablet (10 mg total) by mouth daily. Jerrlyn Morel, NP Taking Active   atorvastatin  (LIPITOR) 80 MG tablet [id number] Yes Take 1 tablet (80 mg total) by mouth daily. Jerrlyn Morel, NP Taking Active   benzonatate  (TESSALON ) 100 MG capsule [id number] Yes Take 1-2 capsules (100-200 mg total) by mouth 3 (three) times daily as needed.  Taking Active   blood glucose meter kit and supplies [id number] Yes Dispense based on patient and insurance preference. Use up to four times daily as directed. (FOR ICD-10 E10.9, E11.9). Stroud, Natalie M, FNP Taking Active Self  Blood Pressure KIT [id number] Yes 1 each by Does not apply route daily. Filbert Huff I, NP Taking Active   carvedilol  (COREG ) 25 MG tablet [id number] Yes Take 1 tablet (25 mg total) by mouth 2 (two) times daily with a meal. Jerrlyn Morel, NP Taking Active   cetirizine  (ZYRTEC ) 10 MG tablet [id number] Yes Take 1 tablet (10 mg total) by mouth daily as needed.  Taking Active   docusate sodium  (COLACE) 100 MG capsule [id number] Yes Take 1 capsule (100 mg total) by mouth daily. Jerrlyn Morel, NP Taking Active   fluticasone  (FLONASE ) 50 MCG/ACT nasal spray [id number] Yes Place 2 sprays into both nostrils daily.  Taking Active   furosemide  (LASIX ) 40 MG tablet [id number] Yes Take 1 tablet (40 mg total) by mouth daily. Jerrlyn Morel, NP Taking Active   gabapentin  (NEURONTIN ) 100 MG capsule [id number] Yes Take 1 capsule (100  mg total) by mouth 2 (two) times daily. Jerrlyn Morel, NP Taking Active   glipiZIDE  (GLUCOTROL ) 10 MG tablet [id number] Yes Take 1 tablet (10 mg total) by mouth 2 (two) times daily with a meal. Jerrlyn Morel, NP Taking Active   insulin  glargine (LANTUS  SOLOSTAR) 100 UNIT/ML Solostar Pen [id number] Yes Inject 28 Units into the skin at bedtime. Jerrlyn Morel, NP Taking Active   Insulin  Pen Needle (NOVOFINE) 10 each [id number]   Currie Douse, NP  Active   Insulin  Pen Needle Zebedee Hibbs MINI PEN NEEDLES) 31G X 6 MM MISC [id number] Yes Use as directed Buena Carmine, NP Taking Active Self  linaclotide  (LINZESS ) 145 MCG CAPS capsule [id number] Yes Take 1 capsule (145 mcg total) by mouth daily before breakfast. May, Deanna J, NP Taking Active   losartan  (COZAAR ) 25 MG tablet [id number] Yes Take 1 tablet (25 mg total) by mouth daily. Jerrlyn Morel, NP Taking Active   metFORMIN  (GLUCOPHAGE -XR) 500 MG 24 hr tablet [id number] Yes Take 2 tablets (1,000 mg total) by mouth 2 (two) times daily with a meal. Jerrlyn Morel, NP Taking Active   Multiple Vitamin (MULTIVITAMIN WITH MINERALS) TABS tablet [id number] Yes Take 1 tablet by mouth daily with breakfast. [provider] Taking Active Self  omega-3 acid ethyl esters (LOVAZA ) 1 g capsule [id number] Yes Take 1 capsule (1 g total) by mouth 2 (two) times daily. Jerrlyn Morel, NP Taking Active   senna (SENOKOT) 8.6  MG tablet [id number] Yes Take 1 tablet (8.6 mg total) by mouth 2 (two) times daily. Jerrlyn Morel, NP Taking Active   spironolactone  (ALDACTONE ) 25 MG tablet [id number] Yes Take 1 tablet (25 mg total) by mouth daily. Jerrlyn Morel, NP Taking Active   tadalafil  (CIALIS ) 20 MG tablet [id number] Yes Take 1 tablet (20 mg total) by mouth daily as needed for erectile dysfunction. Jerrlyn Morel, NP Taking Active   Med List Note Filbert Huff I, NP 03/01/22 2952):              Home Care and Equipment/Supplies:     Functional Questionnaire:    Follow up appointments reviewed:      [facility], RMA

## 2024-04-28 NOTE — Telephone Encounter (Signed)
 Sent to provider

## 2024-05-04 ENCOUNTER — Encounter: Payer: Self-pay | Admitting: Nurse Practitioner

## 2024-05-04 ENCOUNTER — Other Ambulatory Visit: Payer: Self-pay

## 2024-05-04 ENCOUNTER — Ambulatory Visit: Payer: Self-pay | Admitting: Nurse Practitioner

## 2024-05-04 VITALS — BP 134/59 | HR 68 | Temp 97.4°F | Wt 261.4 lb

## 2024-05-04 DIAGNOSIS — M51369 Other intervertebral disc degeneration, lumbar region without mention of lumbar back pain or lower extremity pain: Secondary | ICD-10-CM | POA: Diagnosis not present

## 2024-05-04 DIAGNOSIS — N529 Male erectile dysfunction, unspecified: Secondary | ICD-10-CM | POA: Diagnosis not present

## 2024-05-04 DIAGNOSIS — E1165 Type 2 diabetes mellitus with hyperglycemia: Secondary | ICD-10-CM | POA: Diagnosis not present

## 2024-05-04 DIAGNOSIS — Z794 Long term (current) use of insulin: Secondary | ICD-10-CM | POA: Diagnosis not present

## 2024-05-04 MED ORDER — PREDNISONE 20 MG PO TABS
20.0000 mg | ORAL_TABLET | Freq: Every day | ORAL | 0 refills | Status: AC
  Filled 2024-05-04: qty 5, 5d supply, fill #0

## 2024-05-04 MED ORDER — TIZANIDINE HCL 4 MG PO TABS
4.0000 mg | ORAL_TABLET | Freq: Three times a day (TID) | ORAL | 2 refills | Status: DC | PRN
  Filled 2024-09-13: qty 30, 10d supply, fill #0
  Filled 2024-10-11: qty 30, 10d supply, fill #1
  Filled 2024-11-16: qty 30, 10d supply, fill #2
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
## 2024-05-04 NOTE — Patient Instructions (Signed)
 1. Type 2 diabetes mellitus with hyperglycemia, with long-term current use of insulin  (HCC)  - Urine Albumin/Creatinine with ratio (send out) [LAB689] - AMB Referral VBCI Care Management  2. Degeneration of intervertebral disc of lumbar region, unspecified whether pain present (Primary)  - Ambulatory referral to Orthopedic Surgery  3. Erectile dysfunction, unspecified erectile dysfunction type  - Ambulatory referral to Urology

## 2024-05-05 ENCOUNTER — Other Ambulatory Visit: Payer: Self-pay

## 2024-05-05 LAB — MICROALBUMIN / CREATININE URINE RATIO
Creatinine, Urine: 114.6 mg/dL
Microalb/Creat Ratio: 1108 mg/g{creat} — ABNORMAL HIGH (ref 0–29)
Microalbumin, Urine: 1269.7 ug/mL

## 2024-05-07 ENCOUNTER — Other Ambulatory Visit: Payer: Self-pay | Admitting: Nurse Practitioner

## 2024-05-07 ENCOUNTER — Telehealth: Payer: Self-pay | Admitting: *Deleted

## 2024-05-07 DIAGNOSIS — N289 Disorder of kidney and ureter, unspecified: Secondary | ICD-10-CM

## 2024-05-07 NOTE — Progress Notes (Signed)
 Care Guide Pharmacy Note  05/07/2024 Name: Jared Hart MRN: [id number] DOB: 1966-09-25  Referred By: Jerrlyn Morel, NP Reason for referral: No chief complaint on file.   Jared Hart is a 58 y.o. year old male who is a primary care patient of Jerrlyn Morel, NP.  Jared Hart was referred to the pharmacist for assistance related to: DMII  An unsuccessful telephone outreach was attempted today to contact the patient who was referred to the pharmacy team for assistance with medication assistance. Additional attempts will be made to contact the patient.  Jared Hart  [facility]  [facility], [facility] Guide  Direct Dial: [phone]  Fax [phone]

## 2024-05-10 ENCOUNTER — Ambulatory Visit: Payer: Self-pay | Admitting: Nurse Practitioner

## 2024-05-10 ENCOUNTER — Other Ambulatory Visit: Payer: Self-pay

## 2024-05-10 MED ORDER — METHOCARBAMOL 500 MG PO TABS
500.0000 mg | ORAL_TABLET | Freq: Three times a day (TID) | ORAL | 0 refills | Status: DC
  Filled 2024-05-10: qty 30, 10d supply, fill #0

## 2024-05-11 ENCOUNTER — Other Ambulatory Visit: Payer: Self-pay

## 2024-05-11 ENCOUNTER — Telehealth: Payer: Self-pay

## 2024-05-11 NOTE — Telephone Encounter (Signed)
 Copied from CRM [id number]. Topic: Clinical - Prescription Issue >> May 11, 2024  2:16 PM Elle L wrote: Reason for CRM: The patient states he spoke to NP Abbey Hobby about getting prescribed weight loss medication during his last appointment but it has not been called in. The patient's call back number is [phone].

## 2024-05-12 ENCOUNTER — Other Ambulatory Visit: Payer: Self-pay | Admitting: Nurse Practitioner

## 2024-05-12 DIAGNOSIS — Z794 Long term (current) use of insulin: Secondary | ICD-10-CM

## 2024-05-12 NOTE — Progress Notes (Signed)
Amb ref

## 2024-05-13 ENCOUNTER — Ambulatory Visit: Payer: Self-pay

## 2024-05-13 ENCOUNTER — Other Ambulatory Visit: Payer: Self-pay

## 2024-05-13 ENCOUNTER — Other Ambulatory Visit: Payer: Self-pay | Admitting: Nurse Practitioner

## 2024-05-13 DIAGNOSIS — N529 Male erectile dysfunction, unspecified: Secondary | ICD-10-CM

## 2024-05-13 DIAGNOSIS — R7309 Other abnormal glucose: Secondary | ICD-10-CM

## 2024-05-13 DIAGNOSIS — E118 Type 2 diabetes mellitus with unspecified complications: Secondary | ICD-10-CM

## 2024-05-13 DIAGNOSIS — I10 Essential (primary) hypertension: Secondary | ICD-10-CM

## 2024-05-13 DIAGNOSIS — I6320 Cerebral infarction due to unspecified occlusion or stenosis of unspecified precerebral arteries: Secondary | ICD-10-CM

## 2024-05-13 DIAGNOSIS — K5900 Constipation, unspecified: Secondary | ICD-10-CM

## 2024-05-13 DIAGNOSIS — G8929 Other chronic pain: Secondary | ICD-10-CM

## 2024-05-13 MED ORDER — AMLODIPINE BESYLATE 10 MG PO TABS
10.0000 mg | ORAL_TABLET | Freq: Every day | ORAL | 3 refills | Status: AC
  Filled 2024-05-13: qty 90, 90d supply, fill #0
  Filled 2024-08-13: qty 90, 90d supply, fill #1
  Filled 2024-10-11 – 2024-11-16 (×2): qty 90, 90d supply, fill #2
  Filled 2025-01-31: qty 90, 90d supply, fill #3

## 2024-05-13 MED ORDER — CETIRIZINE HCL 10 MG PO TABS
10.0000 mg | ORAL_TABLET | Freq: Every day | ORAL | 0 refills | Status: DC | PRN
  Filled 2024-05-13: qty 30, 30d supply, fill #0

## 2024-05-13 MED ORDER — GLIPIZIDE 10 MG PO TABS
10.0000 mg | ORAL_TABLET | Freq: Two times a day (BID) | ORAL | 1 refills | Status: DC
  Filled 2024-08-13: qty 180, 90d supply, fill #0

## 2024-05-13 MED ORDER — SPIRONOLACTONE 25 MG PO TABS
25.0000 mg | ORAL_TABLET | Freq: Every day | ORAL | 2 refills | Status: DC
  Filled 2024-05-13: qty 30, 30d supply, fill #0
  Filled 2024-06-08: qty 30, 30d supply, fill #1
  Filled 2024-07-08: qty 30, 30d supply, fill #2

## 2024-05-13 MED ORDER — CARVEDILOL 25 MG PO TABS
25.0000 mg | ORAL_TABLET | Freq: Two times a day (BID) | ORAL | 1 refills | Status: DC
  Filled 2024-05-13: qty 180, 90d supply, fill #0

## 2024-05-13 MED ORDER — GABAPENTIN 100 MG PO CAPS
100.0000 mg | ORAL_CAPSULE | Freq: Two times a day (BID) | ORAL | 2 refills | Status: DC
  Filled 2024-06-08: qty 60, 30d supply, fill #0
  Filled 2024-07-08: qty 60, 30d supply, fill #1
  Filled 2024-08-13: qty 60, 30d supply, fill #2

## 2024-05-13 MED ORDER — GLIPIZIDE 10 MG PO TABS
10.0000 mg | ORAL_TABLET | Freq: Two times a day (BID) | ORAL | 1 refills | Status: DC
Start: 2024-05-13 — End: 2024-05-13
  Filled 2024-05-13: qty 180, 90d supply, fill #0

## 2024-05-13 MED ORDER — OMEGA-3-ACID ETHYL ESTERS 1 G PO CAPS
1.0000 g | ORAL_CAPSULE | Freq: Two times a day (BID) | ORAL | 2 refills | Status: DC
  Filled 2024-05-13: qty 60, 30d supply, fill #0
  Filled 2024-06-17: qty 60, 30d supply, fill #1
  Filled 2024-07-08 – 2024-07-09 (×2): qty 60, 30d supply, fill #2

## 2024-05-13 MED ORDER — ATORVASTATIN CALCIUM 80 MG PO TABS
80.0000 mg | ORAL_TABLET | Freq: Every day | ORAL | 1 refills | Status: DC
  Filled 2024-05-13: qty 90, 90d supply, fill #0

## 2024-05-13 MED ORDER — SENNOSIDES 8.6 MG PO TABS
1.0000 | ORAL_TABLET | Freq: Two times a day (BID) | ORAL | 2 refills | Status: DC
  Filled 2024-05-13: qty 60, 30d supply, fill #0
  Filled 2024-06-08: qty 60, 30d supply, fill #1
  Filled 2024-07-08: qty 60, 30d supply, fill #2

## 2024-05-13 MED ORDER — GABAPENTIN 100 MG PO CAPS
100.0000 mg | ORAL_CAPSULE | Freq: Two times a day (BID) | ORAL | 2 refills | Status: DC
  Filled 2024-05-13: qty 60, 30d supply, fill #0

## 2024-05-13 MED ORDER — FUROSEMIDE 40 MG PO TABS
40.0000 mg | ORAL_TABLET | Freq: Every day | ORAL | 2 refills | Status: DC
  Filled 2024-05-13 – 2024-06-01 (×3): qty 40, 40d supply, fill #0
  Filled 2024-07-08: qty 40, 40d supply, fill #1

## 2024-05-13 MED ORDER — TADALAFIL 20 MG PO TABS
20.0000 mg | ORAL_TABLET | Freq: Every day | ORAL | 0 refills | Status: AC | PRN
  Filled 2024-05-13: qty 10, 10d supply, fill #0

## 2024-05-13 MED ORDER — CARVEDILOL 25 MG PO TABS
25.0000 mg | ORAL_TABLET | Freq: Two times a day (BID) | ORAL | 1 refills | Status: DC
  Filled 2024-08-13: qty 180, 90d supply, fill #0
  Filled 2024-10-11 – 2024-12-27 (×2): qty 180, 90d supply, fill #1

## 2024-05-13 MED ORDER — LOSARTAN POTASSIUM 25 MG PO TABS
25.0000 mg | ORAL_TABLET | Freq: Every day | ORAL | 6 refills | Status: DC
  Filled 2024-05-13: qty 30, 30d supply, fill #0

## 2024-05-13 MED ORDER — ATORVASTATIN CALCIUM 80 MG PO TABS
80.0000 mg | ORAL_TABLET | Freq: Every day | ORAL | 1 refills | Status: DC
  Filled 2024-08-13: qty 90, 90d supply, fill #0
  Filled 2024-10-11 – 2024-11-16 (×2): qty 90, 90d supply, fill #1

## 2024-05-13 NOTE — Telephone Encounter (Signed)
 Please advise [facility]

## 2024-05-14 ENCOUNTER — Other Ambulatory Visit: Payer: Self-pay

## 2024-05-14 NOTE — Telephone Encounter (Signed)
 Referral was sent to [facility] on 05/06 and MyChart message sent to patient with info to call.  Please see pharmacy note within referral.

## 2024-05-17 NOTE — Progress Notes (Signed)
 Care Guide Pharmacy Note  05/17/2024 Name: Jared Hart MRN: [id number] DOB: 03-Mar-1966  Referred By: Jerrlyn Morel, NP Reason for referral: Complex Care Management (Initial outreach to schedule referral with [facility] )   Jared Hart is a 58 y.o. year old male who is a primary care patient of Jerrlyn Morel, NP.  Jared Hart was referred to the pharmacist for assistance related to: DMII and weight loss prevent constipation   Successful contact was made with the patient to discuss pharmacy services including being ready for the pharmacist to call at least 5 minutes before the scheduled appointment time and to have medication bottles and any blood pressure readings ready for review. The patient agreed to meet with the pharmacist via telephone visit on (date/time).06/01/24 at 230 PM   Jared Hart  [facility], [facility] Guide  Direct Dial: [phone]  Fax [phone]

## 2024-06-01 ENCOUNTER — Other Ambulatory Visit (INDEPENDENT_AMBULATORY_CARE_PROVIDER_SITE_OTHER): Payer: Self-pay

## 2024-06-01 ENCOUNTER — Other Ambulatory Visit: Payer: Self-pay

## 2024-06-01 DIAGNOSIS — I10 Essential (primary) hypertension: Secondary | ICD-10-CM

## 2024-06-01 DIAGNOSIS — I5022 Chronic systolic (congestive) heart failure: Secondary | ICD-10-CM

## 2024-06-01 DIAGNOSIS — I639 Cerebral infarction, unspecified: Secondary | ICD-10-CM

## 2024-06-01 DIAGNOSIS — Z794 Long term (current) use of insulin: Secondary | ICD-10-CM

## 2024-06-01 NOTE — Progress Notes (Unsigned)
 06/01/2024 Name: Jared Hart MRN: [id number] DOB: [birth year]  Chief Complaint  Patient presents with   Diabetes   Hypertension   Hyperlipidemia    Jared Hart is a 58 y.o. year old male who presented for a telephone visit.   They were referred to the pharmacist by their PCP for assistance in managing diabetes, hypertension, and hyperlipidemia. PMH includes HTN, HFrEF (EF 40-45% in 2017), TIA and CVA (2023), T2DM, HLD, BMI > 30.    Subjective: Reports that he has gained weight recently (240 > 260 lbs). He has also been having some issues with constipation, which have gotten better since starting Linzess . He is interested in starting a medication to help with weight loss, as he has been feeling very fatigued recently - which he suspects is due to weight gain. He also is having ongoing back pain which he feels his making his BG higher.   Care Team: Primary Care Provider: Jerrlyn Morel, NP ; Next Scheduled Visit: 08/05/24 Urology Provider: Darlynn Elam, MD, Scheduled 07/01/24  Medication Access/Adherence  Current Pharmacy:  [facility] [address], [address] [place] [address] Phone: [phone] Fax: [phone]   Patient reports affordability concerns with their medications: No  Patient reports access/transportation concerns to their pharmacy: No  Patient reports adherence concerns with their medications:  No     Diabetes:  Current medications: metformin  XR 1000 mg BID, insulin  glargine (Lantus ) 28 units daily (reports he is taking 30 units nightly), glipizide  IR 10 mg BID with meals Medications tried in the past: N/A  Current glucose readings: reports that fasting BG have been 205, 206. Denies BG in the 300s. Unable to access meter to provide specific reasings. Using True Metrix meter; testing twice times daily  Start Ozempic   Patient denies hypoglycemic s/sx including dizziness, shakiness, sweating. Patient  denies hyperglycemic symptoms including polyuria, polydipsia, nocturia, neuropathy, blurred vision. Reports he has been craving sweets.   Current meal patterns: Did not discuss at length today - Drinks: drinking water throghout the day  Current physical activity: limited due to fatigue and back pain  Current medication access support: none- Medicaid insurance  Hypertension/HFrEF:  Current medications: carvedilol  25 mg BID, losartan  25 mg daily, spironolactone  25 mg daily, furosemide  40 mg daily Medications previously tried:   Patient has a validated, automated, upper arm home BP cuff Current blood pressure readings readings: Pt spouse reports home BP are averaging 130s/90-91s  Patient denies hypotensive s/sx including dizziness, lightheadedness.  Patient denies hypertensive symptoms including headache, chest pain, shortness of breath    Hyperlipidemia/ASCVD Risk Reduction  Current lipid lowering medications: atorvastatin  80 mg daily Medications tried in the past:   Antiplatelet regimen: aspirin  325 mg daily  ASCVD History: CVA Risk Factors: T2DM, HTN, ASCVD hx, HFrEF  Clinical ASCVD: Yes  The ASCVD Risk score (Arnett DK, et al., 2019) failed to calculate for the following reasons:   Risk score cannot be calculated because patient has a medical history suggesting prior/existing ASCVD   Objective:  BP Readings from Last 3 Encounters:  05/04/24 (!) 134/59  04/12/24 124/66  04/08/24 (!) 142/87    Lab Results  Component Value Date   HGBA1C 7.7 (A) 02/09/2024    Lab Results  Component Value Date   CREATININE 0.99 03/18/2024   BUN 13 03/18/2024   NA 134 (L) 03/18/2024   K 4.1 03/18/2024   CL 99 03/18/2024   CO2 25 03/18/2024  Lab Results  Component Value Date   CHOL 116 04/07/2023   HDL 22 (L) 04/07/2023   LDLCALC 30 04/07/2023   LDLDIRECT 81.4 11/01/2013   TRIG 454 (H) 04/07/2023   CHOLHDL 5.3 (H) 04/07/2023    Medications Reviewed Today     Reviewed  by Adra Alanis, RPH (Pharmacist) on 06/01/24 at 1556  Med List Status: <None>   Medication Order Taking? Sig Documenting Provider Last Dose Status Informant  amLODipine  (NORVASC ) 10 MG tablet [id number] Yes Take 1 tablet (10 mg total) by mouth daily. Jerrlyn Morel, NP Taking Active   aspirin  325 MG tablet [id number] Yes Take 325 mg by mouth daily. [provider] Taking Active   atorvastatin  (LIPITOR) 80 MG tablet [id number] Yes Take 1 tablet (80 mg total) by mouth daily. Jerrlyn Morel, NP Taking Active   blood glucose meter kit and supplies [id number]  Dispense based on patient and insurance preference. Use up to four times daily as directed. (FOR ICD-10 E10.9, E11.9). Stroud, Natalie M, FNP  Active Self  Blood Pressure KIT [id number]  1 each by Does not apply route daily. Filbert Huff I, NP  Active   carvedilol  (COREG ) 25 MG tablet [id number] Yes Take 1 tablet (25 mg total) by mouth 2 (two) times daily with a meal. Jerrlyn Morel, NP Taking Active   cetirizine  (ZYRTEC ) 10 MG tablet [id number] Yes Take 1 tablet (10 mg total) by mouth daily as needed. Jerrlyn Morel, NP Taking Active   fluticasone  (FLONASE ) 50 MCG/ACT nasal spray [id number] Yes Place 2 sprays into both nostrils daily.  Taking Active   furosemide  (LASIX ) 40 MG tablet [id number] Yes Take 1 tablet (40 mg total) by mouth daily. Jerrlyn Morel, NP Taking Active   gabapentin  (NEURONTIN ) 100 MG capsule [id number] Yes Take 1 capsule (100 mg total) by mouth 2 (two) times daily. Jerrlyn Morel, NP Taking Active   glipiZIDE  (GLUCOTROL ) 10 MG tablet [id number] Yes Take 1 tablet (10 mg total) by mouth 2 (two) times daily with a meal. Jerrlyn Morel, NP Taking Active   insulin  glargine (LANTUS  SOLOSTAR) 100 UNIT/ML Solostar Pen [id number] Yes Inject 28 Units into the skin at bedtime. Jerrlyn Morel, NP Taking Active   Insulin  Pen Needle (NOVOFINE) 10 each [id number]   Currie Douse, NP  Active   Insulin  Pen Needle Zebedee Hibbs  MINI PEN NEEDLES) 31G X 6 MM MISC [id number]  Use as directed Buena Carmine, NP  Active Self  lidocaine  (LIDODERM ) 5 % [id number]  Place 1 patch onto the skin daily. Jerrlyn Morel, NP  Active   linaclotide  (LINZESS ) 145 MCG CAPS capsule [id number] Yes Take 1 capsule (145 mcg total) by mouth daily before breakfast. May, Deanna J, NP Taking Active   losartan  (COZAAR ) 25 MG tablet [id number] Yes Take 1 tablet (25 mg total) by mouth daily. Jerrlyn Morel, NP Taking Active   metFORMIN  (GLUCOPHAGE -XR) 500 MG 24 hr tablet [id number] Yes Take 2 tablets (1,000 mg total) by mouth 2 (two) times daily with a meal. Jerrlyn Morel, NP Taking Active   methocarbamol  (ROBAXIN ) 500 MG tablet [id number]  Take 1 tablet (500 mg total) by mouth 3 (three) times daily. Darr, Jacob, PA-C  Active   Multiple Vitamin (MULTIVITAMIN WITH MINERALS) TABS tablet [id number]  Take 1 tablet by mouth daily with breakfast. [provider]  Active Self  omega-3 acid ethyl esters (LOVAZA ) 1 g capsule [id number] Yes Take 1 capsule (1 g total) by mouth  2 (two) times daily. Jerrlyn Morel, NP Taking Active   senna (SENOKOT) 8.6 MG tablet [id number]  Take 1 tablet (8.6 mg total) by mouth 2 (two) times daily. Jerrlyn Morel, NP  Active   spironolactone  (ALDACTONE ) 25 MG tablet [id number] Yes Take 1 tablet (25 mg total) by mouth daily. Jerrlyn Morel, NP Taking Active   tadalafil  (CIALIS ) 20 MG tablet [id number]  Take 1 tablet (20 mg total) by mouth daily as needed for erectile dysfunction. Jerrlyn Morel, NP  Active   tiZANidine  (ZANAFLEX ) 4 MG tablet [id number]  Take 1 tablet (4 mg total) by mouth 3 (three) times daily as needed for muscle spasms. Jerrlyn Morel, NP  Active   Med List Note Jamel Mc, [place], [place] 03/01/22 [id number]):                Assessment/Plan:   Diabetes: - Currently uncontrolled with last A1C 7.7% above goal < 7%, but SMBG indicate worsened control. Patient is currently on metformin  and glipizide ,  which he is tolerating well. However, he would benefit from escalation in therapy with GLP-1RA. He is a good candidate given known ASCVD history, elevated BMI and recent weight gain. However, he does have a history of constipation. Additionally, TG were recently elevated but these were non-fasting labs. Overall, feel that benefit of initiating GLP-1RA outweighs risk of worsening constipation or more severe AE like pancreatitis. Patient was thoroughly educated on administration and possible side effects of GLP-1RA Ozempic.  - Reviewed long term cardiovascular and renal outcomes of uncontrolled blood sugar - Reviewed goal A1c, goal fasting, and goal 2 hour post prandial glucose - Reviewed dietary modifications including - having small portions of high protein meals throughout the day, reducing carbohydrate heavy foods, drinking more water - Reviewed lifestyle modifications including: increasing physical activity - Recommend to continue Lantus  30 units nightly, metformin  XR 1000 mg BID and glipizide  IR 10 mg BID with meals - Recommend to start Ozempic 0.25 mg subcutaneous weekly x4 weeks then increase to 0.5 mg weekly. Will collaborate with PCP to place orders. Will collaborate with patient advocate team to submit PA. Patient aware to seek urgent care and stop medication if he has severe abdominal pain or vomiting.  - Future considerations: patient is a good candidate for SGLT2i with macroalbuminuria. He does have a referral to nephrology in place.  - Patient denies personal or family history of multiple endocrine neoplasia type 2, medullary thyroid cancer; personal history of pancreatitis. He repots a personal history of cholecystectomy.  - Recommend to check glucose twice daily and keep a log. Instructed to bring glucometer to next appointment.   Hypertension: - Currently uncontrolled with home BP above goal < 130/80 mmHg. Patient also has microalbuminuria and HFrEF. Therefore, would like to maximize ARB.  Patient will need repeat BMP in ~1 mo to monitor Scr and potassium.  - Reviewed long term cardiovascular and renal outcomes of uncontrolled blood pressure - Reviewed appropriate blood pressure monitoring technique and reviewed goal blood pressure. Recommended to check home blood pressure and heart rate daily. - Recommend to carvedilol  25 mg BID, spironolactone  25 mg daily, furosemide  40 mg daily  - Recommend to increase losartan  to 50 mg daily - Repeat BMP in ~4 weeks   Hyperlipidemia/ASCVD Risk Reduction: - Currently uncontrolled. Cannot calculate accurate LDL-C with TG > 400 mg/dL. Will plan to repeat lipid panel with direct-LDL at next appointment and determine whether escalation in lipid-lowering therapy is needed.  - Reviewed long term  complications of uncontrolled cholesterol - Recommend to continue atorvastatin  80 mg daily   Follow Up Plan: pharmacist in person 07/14/24, PCP 08/05/24  Arthea Larsson, PharmD PGY1 Pharmacy Resident

## 2024-06-02 ENCOUNTER — Other Ambulatory Visit: Payer: Self-pay

## 2024-06-02 MED ORDER — OZEMPIC (0.25 OR 0.5 MG/DOSE) 2 MG/3ML ~~LOC~~ SOPN
PEN_INJECTOR | SUBCUTANEOUS | 1 refills | Status: DC
  Filled 2024-06-02: qty 3, 42d supply, fill #0
  Filled 2024-07-09: qty 3, 42d supply, fill #1

## 2024-06-02 MED ORDER — LOSARTAN POTASSIUM 50 MG PO TABS
50.0000 mg | ORAL_TABLET | Freq: Every day | ORAL | 3 refills | Status: AC
  Filled 2024-06-02: qty 90, 90d supply, fill #0
  Filled 2024-08-13: qty 90, 90d supply, fill #1
  Filled 2024-10-11 – 2024-11-16 (×2): qty 90, 90d supply, fill #2
  Filled 2025-01-31: qty 90, 90d supply, fill #3

## 2024-06-08 ENCOUNTER — Other Ambulatory Visit: Payer: Self-pay | Admitting: Nurse Practitioner

## 2024-06-08 ENCOUNTER — Other Ambulatory Visit: Payer: Self-pay

## 2024-06-08 ENCOUNTER — Other Ambulatory Visit (HOSPITAL_COMMUNITY): Payer: Self-pay

## 2024-06-08 DIAGNOSIS — Z794 Long term (current) use of insulin: Secondary | ICD-10-CM

## 2024-06-08 MED ORDER — CETIRIZINE HCL 10 MG PO TABS
10.0000 mg | ORAL_TABLET | Freq: Every day | ORAL | 0 refills | Status: DC | PRN
  Filled 2024-06-08: qty 30, 30d supply, fill #0

## 2024-06-08 MED ORDER — METFORMIN HCL ER 500 MG PO TB24
1000.0000 mg | ORAL_TABLET | Freq: Two times a day (BID) | ORAL | 1 refills | Status: DC
Start: 2024-06-08 — End: 2024-08-13
  Filled 2024-06-08: qty 180, 45d supply, fill #0
  Filled 2024-07-08 – 2024-07-12 (×2): qty 180, 45d supply, fill #1

## 2024-06-09 ENCOUNTER — Other Ambulatory Visit: Payer: Self-pay

## 2024-06-17 ENCOUNTER — Other Ambulatory Visit: Payer: Self-pay

## 2024-06-18 ENCOUNTER — Other Ambulatory Visit: Payer: Self-pay

## 2024-07-01 ENCOUNTER — Encounter: Payer: Self-pay | Admitting: Urology

## 2024-07-01 ENCOUNTER — Ambulatory Visit (INDEPENDENT_AMBULATORY_CARE_PROVIDER_SITE_OTHER): Admitting: Urology

## 2024-07-01 VITALS — BP 115/69 | HR 73 | Wt 263.7 lb

## 2024-07-01 DIAGNOSIS — N5201 Erectile dysfunction due to arterial insufficiency: Secondary | ICD-10-CM | POA: Diagnosis not present

## 2024-07-01 NOTE — Patient Instructions (Signed)
 Information on Penile Injection-  [email or web address]
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
## 2024-07-01 NOTE — Progress Notes (Signed)
 Jared Hart

## 2024-07-08 ENCOUNTER — Other Ambulatory Visit: Payer: Self-pay

## 2024-07-08 ENCOUNTER — Other Ambulatory Visit: Payer: Self-pay | Admitting: Nurse Practitioner

## 2024-07-08 MED ORDER — CETIRIZINE HCL 10 MG PO TABS
10.0000 mg | ORAL_TABLET | Freq: Every day | ORAL | 0 refills | Status: DC | PRN
  Filled 2024-07-08: qty 30, 30d supply, fill #0

## 2024-07-09 ENCOUNTER — Other Ambulatory Visit (HOSPITAL_COMMUNITY): Payer: Self-pay

## 2024-07-09 ENCOUNTER — Other Ambulatory Visit: Payer: Self-pay

## 2024-07-13 NOTE — Progress Notes (Unsigned)
 07/14/2024 Name: Jared Hart MRN: [id number] DOB: [birth year]  Chief Complaint  Patient presents with   Diabetes   Hypertension   Hyperlipidemia    Jared Hart is a 58 y.o. year old male who presented for a face-to-face visit.    They were referred to the pharmacist by their PCP for assistance in managing diabetes, hypertension, and hyperlipidemia. PMH includes HTN, HFrEF (EF 40-45% in 2017), TIA and CVA (2023), T2DM, HLD, BMI > 30.    Subjective: Patient was last seen by PCP, Bascom Borer, NP on 05/04/24, at which time his BP was elevated. He was last engaged by pharmacy via telephone on 06/01/24. He reported that he was interested in starting a medication for weight loss, as he had been feeling very fatigued recently. He was instructed to start Ozempic  for weight and diabetes. He was instructed to increase losartan  to 50 mg daily for elevated BP. BP at urology appointment was 115/69 mmHg.   Today, patient reports that he is doing ok. He still feels fatigued, but he is tolerating Ozempic  well. He notes some appetite suppression. BP is improved with increase in losartan .   Care Team: Primary Care Provider: Borer Bascom RAMAN, NP ; Next Scheduled Visit: 08/05/24 Urology Provider: Glendia Barba, MD, not scheduled  Medication Access/Adherence  Current Pharmacy:  [facility] [address], [address] [place] [address] Phone: [phone] Fax: [phone]   Patient reports affordability concerns with their medications: No  Patient reports access/transportation concerns to their pharmacy: No  Patient reports adherence concerns with their medications:  No    Reports that last week he missed one dose of Ozempic    Diabetes:  Current medications: metformin  XR 1000 mg BID, insulin  glargine (Lantus ) 28 units daily (reports he is taking 30 units nightly), glipizide  IR 10 mg BID with meals, Ozempic  0.5 mg weekly (Thursdays) Medications  tried in the past: N/A  Feels like he is not has been as hungry as usual. Eating smaller portions. He increased to Ozempic  0.5 mg for the first time last Thursday.   Current glucose readings: reports that fasting BG have been 205, 206. Denies BG in the 300s. Does not have glucometer with him today. Using True Metrix meter; testing twice times daily  Patient denies hypoglycemic s/sx including dizziness, shakiness, sweating. Patient denies hyperglycemic symptoms including polyuria, polydipsia, nocturia, neuropathy, blurred vision.   Current meal patterns: Typically 2 meals daily - Breakfast: bowl of cheerios - Dinner: last night two corn dogs, a couple fries - Drinks: drinking water throghout the day  Current physical activity: limited due to fatigue and back pain  Current medication access support: none- Medicaid insurance  Hypertension/HFrEF:  Current medications: carvedilol  25 mg BID, losartan  50 mg daily, spironolactone  25 mg daily, furosemide  40 mg daily  Patient has a validated, automated, upper arm home BP cuff - checking nightly Current blood pressure readings readings: Pt reports home BP are averaging less than 130/80   Patient denies hypotensive s/sx including dizziness, lightheadedness.  Patient denies hypertensive symptoms including headache, chest pain, shortness of breath  Patient takes weight daily. Currently stable. No issues with fluid accumulation in LE.    Hyperlipidemia/ASCVD Risk Reduction  Current lipid lowering medications: atorvastatin  80 mg daily Medications tried in the past:   Antiplatelet regimen: aspirin  325 mg daily  ASCVD History: CVA Risk Factors: T2DM, HTN, ASCVD hx, HFrEF  Clinical ASCVD: Yes  The ASCVD Risk score (Arnett DK, et al., 2019)  failed to calculate for the following reasons:   Risk score cannot be calculated because patient has a medical history suggesting prior/existing ASCVD   Objective:  BP Readings from Last 3 Encounters:   07/14/24 121/62  07/01/24 115/69  05/04/24 (!) 134/59    Lab Results  Component Value Date   HGBA1C 7.8 (A) 07/14/2024    Lab Results  Component Value Date   CREATININE 0.99 03/18/2024   BUN 13 03/18/2024   NA 134 (L) 03/18/2024   K 4.1 03/18/2024   CL 99 03/18/2024   CO2 25 03/18/2024    Lab Results  Component Value Date   CHOL 116 04/07/2023   HDL 22 (L) 04/07/2023   LDLCALC 30 04/07/2023   LDLDIRECT 81.4 11/01/2013   TRIG 454 (H) 04/07/2023   CHOLHDL 5.3 (H) 04/07/2023    Medications Reviewed Today     Reviewed by Brinda Lorain SQUIBB, RPH (Pharmacist) on 07/14/24 at 1622  Med List Status: <None>   Medication Order Taking? Sig Documenting Provider Last Dose Status Informant  amLODipine  (NORVASC ) 10 MG tablet [id number] Yes Take 1 tablet (10 mg total) by mouth daily. Oley Bascom RAMAN, NP  Active   aspirin  325 MG tablet [id number] Yes Take 325 mg by mouth daily. [provider]  Active   atorvastatin  (LIPITOR) 80 MG tablet [id number] Yes Take 1 tablet (80 mg total) by mouth daily. Oley Bascom RAMAN, NP  Active   blood glucose meter kit and supplies [id number]  Dispense based on patient and insurance preference. Use up to four times daily as directed. (FOR ICD-10 E10.9, E11.9). Stroud, Natalie M, FNP  Active Self  Blood Pressure KIT [id number]  1 each by Does not apply route daily. Shannan Sia I, NP  Active   carvedilol  (COREG ) 25 MG tablet [id number] Yes Take 1 tablet (25 mg total) by mouth 2 (two) times daily with a meal. Oley Bascom RAMAN, NP  Active   cetirizine  (ZYRTEC ) 10 MG tablet [id number]  Take 1 tablet (10 mg total) by mouth daily as needed. Oley Bascom RAMAN, NP  Active   dapagliflozin  propanediol (FARXIGA ) 10 MG TABS tablet [id number] Yes Take 1 tablet (10 mg total) by mouth daily. Oley Bascom RAMAN, NP  Active   fluticasone  (FLONASE ) 50 MCG/ACT nasal spray [id number]  Place 2 sprays into both nostrils daily.   Active   furosemide  (LASIX ) 40 MG tablet [id number] Yes  Take 1 tablet (40 mg total) by mouth daily. Oley Bascom RAMAN, NP  Active   gabapentin  (NEURONTIN ) 100 MG capsule [id number] Yes Take 1 capsule (100 mg total) by mouth 2 (two) times daily. Oley Bascom RAMAN, NP  Active   glipiZIDE  (GLUCOTROL ) 10 MG tablet [id number] Yes Take 1 tablet (10 mg total) by mouth 2 (two) times daily with a meal. Oley Bascom RAMAN, NP  Active   insulin  glargine (LANTUS  SOLOSTAR) 100 UNIT/ML Solostar Pen [id number] Yes Inject 28 Units into the skin at bedtime. Oley Bascom RAMAN, NP  Active   Insulin  Pen Needle (NOVOFINE) 10 each [id number]   [place] Rock BROCKS, NP  Consider Medication Status and Discontinue (One time medication)   Insulin  Pen Needle (ULTICARE MINI PEN NEEDLES) 31G X 6 MM MISC [id number]  Use as directed Arloa Suzen RAMAN, NP  Active Self  lidocaine  (LIDODERM ) 5 % [id number]  Place 1 patch onto the skin daily. Oley Bascom RAMAN, NP  Active   linaclotide  (LINZESS ) 145 MCG CAPS capsule [id number] Yes Take 1 capsule (145 mcg total) by mouth daily  before breakfast. May, Deanna J, NP  Active   losartan  (COZAAR ) 50 MG tablet [id number] Yes Take 1 tablet (50 mg total) by mouth daily. Oley Bascom RAMAN, NP  Active   metFORMIN  (GLUCOPHAGE -XR) 500 MG 24 hr tablet [id number] Yes Take 2 tablets (1,000 mg total) by mouth 2 (two) times daily with a meal. Oley Bascom RAMAN, NP  Active   methocarbamol  (ROBAXIN ) 500 MG tablet [id number]  Take 1 tablet (500 mg total) by mouth 3 (three) times daily. Darr, Jacob, PA-C  Active   Multiple Vitamin (MULTIVITAMIN WITH MINERALS) TABS tablet [id number]  Take 1 tablet by mouth daily with breakfast. [provider]  Active Self  omega-3 acid ethyl esters (LOVAZA ) 1 g capsule [id number] Yes Take 1 capsule (1 g total) by mouth 2 (two) times daily. Oley Bascom RAMAN, NP  Active   Semaglutide ,0.25 or 0.5MG /DOS, (OZEMPIC , 0.25 OR 0.5 MG/DOSE,) 2 MG/3ML SOPN [id number] Yes INJECT 0.25MG  INTO THE SKIN FOR 4 WEEKS , THEN INCREASE TO 0.5MG  WEEKLY Nichols, Tonya  S, NP  Active   senna (SENOKOT) 8.6 MG tablet [id number] Yes Take 1 tablet (8.6 mg total) by mouth 2 (two) times daily. Oley Bascom RAMAN, NP  Active   spironolactone  (ALDACTONE ) 25 MG tablet [id number] Yes Take 1 tablet (25 mg total) by mouth daily. Oley Bascom RAMAN, NP  Active   tadalafil  (CIALIS ) 20 MG tablet [id number] Yes Take 1 tablet (20 mg total) by mouth daily as needed for erectile dysfunction. Oley Bascom RAMAN, NP  Active   tiZANidine  (ZANAFLEX ) 4 MG tablet [id number]  Take 1 tablet (4 mg total) by mouth 3 (three) times daily as needed for muscle spasms. Oley Bascom RAMAN, NP  Active   Med List Note Chari, Dellroy I, [place] 03/01/22 [id number]):                Assessment/Plan:   Diabetes: - Currently uncontrolled with A1C 7.8% above goal < 7%, despite recent addition of Ozempic  over the past 5 weeks. Patient is tolerating GLP-1RA well so far, with recent increase to 0.5 mg weekly. He is a good candidate given known ASCVD history, elevated BMI and recent weight gain, but will need to continue to monitor closely for worsening constipation. Patient is also a good candidate for initiation of an SGLT2i with T2DM, HFrEF, and microalbuminuria. He is amenable to starting today, and was thoroughly educated on possible side effects.  - Reviewed long term cardiovascular and renal outcomes of uncontrolled blood sugar - Reviewed goal A1c, goal fasting, and goal 2 hour post prandial glucose - Reviewed dietary modifications including - having small portions of high protein meals throughout the day, reducing carbohydrate heavy foods, drinking more water - Reviewed lifestyle modifications including: increasing physical activity - Recommend to continue Lantus  30 units nightly, metformin  XR 1000 mg BID and glipizide  IR 10 mg BID with meals - Recommend to continue Ozempic  0.5 mg subcutaneous weekly. Patient aware to seek urgent care and stop medication if he has severe abdominal pain or vomiting.  - Recommend to  start Farxiga  10 mg daily. Patient educated on administration and possible AE, including sick day rules.  - Patient denies personal or family history of multiple endocrine neoplasia type 2, medullary thyroid cancer; personal history of pancreatitis. He repots a personal history of cholecystectomy.  - Recommend to check glucose twice daily and keep a log. Instructed to bring glucometer to next appointment.   Hypertension: - Currently controlled with home BP above goal < 130/80 mmHg since increasing  dose of losartan . Discussed obtaining repeat BMP today to monitor Scr and K, but patient prefers to weight until PCP appt in ~3 weeks because he will also need a fasting lipid panel at that time. Because we are starting SGLT2i today, and patient has not had any issues with fluid accumulation in many months, counseled patient to decrease furosemide  to 40 mg daily PRN weight gain. He monitors his weight daily and feels comfortable with this change.  - Reviewed long term cardiovascular and renal outcomes of uncontrolled blood pressure - Reviewed appropriate blood pressure monitoring technique and reviewed goal blood pressure. Recommended to check home blood pressure and heart rate daily. - Recommend to carvedilol  25 mg BID, spironolactone  25 mg daily, losartan  50 mg daily - Recommend to decrease furosemide  to 40 mg daily PRN weight gain of 2-3 lbs in one day or 5 lbs in one week - Repeat BMP to monitor K and Scr at next PCP appt   Hyperlipidemia/ASCVD Risk Reduction: - Currently uncontrolled. Cannot calculate accurate LDL-C with TG > 400 mg/dL. Will plan to repeat fasting lipid panel with direct-LDL at next appointment and determine whether escalation in lipid-lowering therapy is needed.  - Reviewed long term complications of uncontrolled cholesterol - Recommend to continue atorvastatin  80 mg daily   Follow Up Plan: PCP 08/05/24, pharmacist in-person 09/08/24  Lorain Baseman, PharmD [facility] [phone]

## 2024-07-14 ENCOUNTER — Ambulatory Visit: Payer: Self-pay

## 2024-07-14 ENCOUNTER — Other Ambulatory Visit: Payer: Self-pay

## 2024-07-14 VITALS — BP 121/62 | HR 75 | Wt 261.0 lb

## 2024-07-14 DIAGNOSIS — E1149 Type 2 diabetes mellitus with other diabetic neurological complication: Secondary | ICD-10-CM

## 2024-07-14 DIAGNOSIS — I10 Essential (primary) hypertension: Secondary | ICD-10-CM

## 2024-07-14 DIAGNOSIS — Z794 Long term (current) use of insulin: Secondary | ICD-10-CM | POA: Diagnosis not present

## 2024-07-14 DIAGNOSIS — Z7985 Long-term (current) use of injectable non-insulin antidiabetic drugs: Secondary | ICD-10-CM

## 2024-07-14 DIAGNOSIS — R7309 Other abnormal glucose: Secondary | ICD-10-CM

## 2024-07-14 DIAGNOSIS — E118 Type 2 diabetes mellitus with unspecified complications: Secondary | ICD-10-CM

## 2024-07-14 LAB — POCT GLYCOSYLATED HEMOGLOBIN (HGB A1C): HbA1c, POC (controlled diabetic range): 7.8 % — AB (ref 0.0–7.0)

## 2024-07-14 MED ORDER — OZEMPIC (0.25 OR 0.5 MG/DOSE) 2 MG/3ML ~~LOC~~ SOPN
0.5000 mg | PEN_INJECTOR | SUBCUTANEOUS | 1 refills | Status: DC
  Filled 2024-07-14: qty 3, fill #0
  Filled 2024-08-13: qty 3, 28d supply, fill #0

## 2024-07-14 MED ORDER — LANTUS SOLOSTAR 100 UNIT/ML ~~LOC~~ SOPN
30.0000 [IU] | PEN_INJECTOR | Freq: Every day | SUBCUTANEOUS | 3 refills | Status: DC
  Filled 2024-07-14: qty 27, 90d supply, fill #0

## 2024-07-14 MED ORDER — DAPAGLIFLOZIN PROPANEDIOL 10 MG PO TABS
10.0000 mg | ORAL_TABLET | Freq: Every day | ORAL | 3 refills | Status: DC
  Filled 2024-07-14: qty 14, 14d supply, fill #0
  Filled 2024-07-27: qty 14, 14d supply, fill #1
  Filled 2024-07-28: qty 30, 30d supply, fill #1
  Filled 2024-08-13: qty 30, 30d supply, fill #2

## 2024-07-14 MED ORDER — FUROSEMIDE 40 MG PO TABS
40.0000 mg | ORAL_TABLET | Freq: Every day | ORAL | 2 refills | Status: DC | PRN
  Filled 2024-07-14 – 2024-08-13 (×2): qty 40, 40d supply, fill #0
  Filled 2024-09-13: qty 40, 40d supply, fill #1
  Filled 2024-10-11: qty 40, 40d supply, fill #2

## 2024-07-14 NOTE — Patient Instructions (Signed)
 It was nice to see you today!  Your goal blood sugar is 80-130 before eating and less than 180 after eating.  Medication Changes: START Farxiga  10 mg daily. When you start this medicine, start taking furosemide  (Lasix ) 40 mg once daily AS NEEDED if you gain 2-3 lbs in one day or 5 lbs in one week.   Continue Ozempic  0.5 mg weekly, Lantus  30 units nightly, glipizide  10 mg twice daily with meals  Continue carvedilol  25 mg twice daily, losartan  50 mg daily, spironolactone  25 mg daily for your blood pressure and your heart.   Continue atorvastatin  80 mg daily for cholesterol  Continue all other medication the same.   Monitor blood sugars at home and keep a log (glucometer or piece of paper) to bring with you to your next visit.  Keep up the good work with diet and exercise. Aim for a diet full of vegetables, fruit and lean meats (chicken, [place], fish). Try to limit salt intake by eating fresh or frozen vegetables (instead of canned), rinse canned vegetables prior to cooking and do not add any additional salt to meals.

## 2024-07-15 ENCOUNTER — Other Ambulatory Visit: Payer: Self-pay

## 2024-07-15 DIAGNOSIS — I10 Essential (primary) hypertension: Secondary | ICD-10-CM

## 2024-07-15 DIAGNOSIS — E7849 Other hyperlipidemia: Secondary | ICD-10-CM

## 2024-07-15 NOTE — Progress Notes (Signed)
 Patient will have BMP and fasting lipid panel drawn at upcoming PCP appt at [facility] on 08/05/24.   Lorain Baseman, PharmD [facility] [phone]

## 2024-07-19 ENCOUNTER — Other Ambulatory Visit: Payer: Self-pay

## 2024-07-26 ENCOUNTER — Other Ambulatory Visit: Payer: Self-pay

## 2024-07-27 ENCOUNTER — Other Ambulatory Visit: Payer: Self-pay

## 2024-07-28 ENCOUNTER — Other Ambulatory Visit: Payer: Self-pay

## 2024-07-28 MED ORDER — CYCLOBENZAPRINE HCL 5 MG PO TABS
5.0000 mg | ORAL_TABLET | Freq: Three times a day (TID) | ORAL | 1 refills | Status: AC
  Filled 2024-07-28: qty 30, 10d supply, fill #0
  Filled 2024-08-13: qty 30, 10d supply, fill #1

## 2024-08-05 ENCOUNTER — Other Ambulatory Visit: Payer: Self-pay

## 2024-08-05 ENCOUNTER — Encounter: Payer: Self-pay | Admitting: Nurse Practitioner

## 2024-08-05 ENCOUNTER — Ambulatory Visit (INDEPENDENT_AMBULATORY_CARE_PROVIDER_SITE_OTHER): Admitting: Nurse Practitioner

## 2024-08-05 VITALS — BP 114/64 | HR 77 | Temp 97.7°F | Wt 261.0 lb

## 2024-08-05 DIAGNOSIS — R200 Anesthesia of skin: Secondary | ICD-10-CM

## 2024-08-05 DIAGNOSIS — E1149 Type 2 diabetes mellitus with other diabetic neurological complication: Secondary | ICD-10-CM

## 2024-08-05 DIAGNOSIS — E7849 Other hyperlipidemia: Secondary | ICD-10-CM

## 2024-08-05 DIAGNOSIS — I10 Essential (primary) hypertension: Secondary | ICD-10-CM
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
## 2024-08-06 ENCOUNTER — Ambulatory Visit: Payer: Self-pay

## 2024-08-06 LAB — BASIC METABOLIC PANEL WITH GFR
BUN/Creatinine Ratio: 12 (ref 9–20)
BUN: 12 mg/dL (ref 6–24)
CO2: 21 mmol/L (ref 20–29)
Calcium: 9.5 mg/dL (ref 8.7–10.2)
Chloride: 99 mmol/L (ref 96–106)
Creatinine, Ser: 1.01 mg/dL (ref 0.76–1.27)
Glucose: 56 mg/dL — ABNORMAL LOW (ref 70–99)
Potassium: 3.9 mmol/L (ref 3.5–5.2)
Sodium: 140 mmol/L (ref 134–144)
eGFR: 87 mL/min/1.73 (ref >59)

## 2024-08-06 LAB — LIPID PANEL
Chol/HDL Ratio: 3.6 ratio (ref 0.0–5.0)
Cholesterol, Total: 80 mg/dL — ABNORMAL LOW (ref 100–199)
HDL: 22 mg/dL — ABNORMAL LOW (ref >39)
LDL Chol Calc (NIH): 22 mg/dL (ref 0–99)
Triglycerides: 233 mg/dL — ABNORMAL HIGH (ref 0–149)
VLDL Cholesterol Cal: 36 mg/dL (ref 5–40)

## 2024-08-11 ENCOUNTER — Ambulatory Visit (INDEPENDENT_AMBULATORY_CARE_PROVIDER_SITE_OTHER): Admitting: Podiatry

## 2024-08-11 VITALS — Ht 73.0 in | Wt 216.0 lb

## 2024-08-11 DIAGNOSIS — E119 Type 2 diabetes mellitus without complications: Secondary | ICD-10-CM

## 2024-08-11 DIAGNOSIS — B351 Tinea unguium: Secondary | ICD-10-CM

## 2024-08-11 DIAGNOSIS — M5417 Radiculopathy, lumbosacral region: Secondary | ICD-10-CM

## 2024-08-11 NOTE — Progress Notes (Signed)
 Subjective:  Patient ID: Jared Hart, male    DOB: 11-Nov-1966,  MRN: [id number]  Chief Complaint  Patient presents with   Numbness    Rm 1 NP - right foot going numb-Tonya Oley, NP refer. Patient states numbing sensation in feet has been present for the past three months. Numbing sensation occurs after prolong sitting, it radiates throughout the feet up to the ankles. Patient is diabetic (A1C-7.5).    Discussed the use of AI scribe software for clinical note transcription with the patient, who gave verbal consent to proceed.  History of Present Illness Jared Hart is a 58 year old male with diabetes who presents with numbness in the right foot. He was referred by his primary care doctor for evaluation of his foot numbness.  He experiences numbness in his right foot, occasionally extending to the ankle, described as a tingling sensation without pain. The numbness improves with walking, and the left foot is unaffected.  He has a history of bulging discs in the lumbar spine, which he suspects may be related to his symptoms. He has undergone an MRI and is currently seeing a back specialist for this issue.  He has diabetes, with a recent A1c of 7.5, improved from a previous level of 9.8. He is on a new medication that helps manage his blood sugar by excreting sugar through urine and is also taking Ozempic. He is vigilant about foot care due to his diabetes, checking his feet daily and being cautious with nail care.      Objective:    Physical Exam VASCULAR: DP and PT pulse palpable. Foot is warm and well-perfused. Capillary fill time is brisk. DERMATOLOGIC: Normal skin turgor, texture, and temperature. No open lesions, rashes, or ulcerations. NEUROLOGIC: Normal sensation to light touch and pressure. No paresthesias on examination. ORTHOPEDIC: Smooth, pain-free range of motion of all examined joints. No ecchymosis or bruising. No gross deformity. No pain to palpation. No palpable  Tinel's sign at tarsal tunnel bilaterally.   No images are attached to the encounter.    Results A1c: 7.5 (06/11/2024)  RADIOLOGY Lumbar spine MRI (per patient): Disc bulge   Assessment:   1. Encounter for diabetic foot exam (HCC)   2. Lumbosacral radiculopathy   3. Onychomycosis      Plan:  Patient was evaluated and treated and all questions answered.  Assessment and Plan Assessment & Plan Lumbar radiculopathy with right foot numbness Intermittent numbness and tingling in the right foot, extending to the ankle, without associated pain. Symptoms improve with movement, likely related to lumbar radiculopathy due to bulging discs. Differential includes diabetic neuropathy, but symptoms are unilateral and improve with movement, which is atypical for diabetic neuropathy. Good circulation and pulses in the foot, ruling out circulatory issues. - Follow up with spine surgeon later this month. - Advise daily foot checks to prevent unnoticed injuries due to numbness. - Communicate findings to primary care physician.  Onychomycosis (nail fungus) Presence of nail fungus, likely related to diabetes, with no systemic health concerns. - Prescribe topical antifungal medication, similar to medicated nail polish.  Type 2 diabetes mellitus without complications Type 2 diabetes mellitus with improved glycemic control. Recent A1c of 7.5, down from 9.8. On medication to excrete sugar through urine and Ozempic for glycemic control. Emphasized the importance of maintaining blood sugar control to prevent foot complications. - Continue current diabetes medications and management plan. - Maintain regular monitoring of blood sugar levels.      Return if symptoms worsen  or fail to improve.

## 2024-08-13 ENCOUNTER — Other Ambulatory Visit: Payer: Self-pay

## 2024-08-13 ENCOUNTER — Other Ambulatory Visit: Payer: Self-pay | Admitting: Nurse Practitioner

## 2024-08-13 DIAGNOSIS — E1149 Type 2 diabetes mellitus with other diabetic neurological complication: Secondary | ICD-10-CM

## 2024-08-13 DIAGNOSIS — K5900 Constipation, unspecified: Secondary | ICD-10-CM

## 2024-08-13 DIAGNOSIS — I10 Essential (primary) hypertension: Secondary | ICD-10-CM

## 2024-08-13 MED ORDER — SENNOSIDES 8.6 MG PO TABS
1.0000 | ORAL_TABLET | Freq: Two times a day (BID) | ORAL | 2 refills | Status: DC
  Filled 2024-09-13: qty 60, 30d supply, fill #0
  Filled 2024-10-11: qty 60, 30d supply, fill #1
  Filled 2024-12-27: qty 60, 30d supply, fill #2

## 2024-08-13 MED ORDER — SPIRONOLACTONE 25 MG PO TABS
25.0000 mg | ORAL_TABLET | Freq: Every day | ORAL | 2 refills | Status: DC
  Filled 2024-09-13 (×2): qty 30, 30d supply, fill #0
  Filled 2024-10-11: qty 30, 30d supply, fill #1
  Filled 2024-11-16: qty 30, 30d supply, fill #2

## 2024-08-13 MED ORDER — SENNOSIDES 8.6 MG PO TABS
1.0000 | ORAL_TABLET | Freq: Two times a day (BID) | ORAL | 2 refills | Status: DC
  Filled 2024-08-13: qty 60, 30d supply, fill #0

## 2024-08-13 MED ORDER — METFORMIN HCL ER 500 MG PO TB24
1000.0000 mg | ORAL_TABLET | Freq: Two times a day (BID) | ORAL | 1 refills | Status: DC
  Filled 2024-08-13 – 2024-09-13 (×2): qty 180, 45d supply, fill #0
  Filled 2024-10-19: qty 180, 45d supply, fill #1

## 2024-08-13 MED ORDER — METFORMIN HCL ER 500 MG PO TB24
1000.0000 mg | ORAL_TABLET | Freq: Two times a day (BID) | ORAL | 1 refills | Status: DC
  Filled 2024-08-13: qty 180, 45d supply, fill #0

## 2024-08-13 MED ORDER — OZEMPIC (0.25 OR 0.5 MG/DOSE) 2 MG/3ML ~~LOC~~ SOPN
0.5000 mg | PEN_INJECTOR | SUBCUTANEOUS | 1 refills | Status: DC
  Filled 2024-08-13: qty 3, fill #0
  Filled 2024-08-17: qty 3, 28d supply, fill #0
  Filled 2024-09-07: qty 3, 28d supply, fill #1

## 2024-08-13 MED ORDER — DAPAGLIFLOZIN PROPANEDIOL 10 MG PO TABS
10.0000 mg | ORAL_TABLET | Freq: Every day | ORAL | 3 refills | Status: AC
  Filled 2024-08-13 – 2024-08-26 (×2): qty 90, 90d supply, fill #0
  Filled 2024-10-11 – 2024-11-16 (×2): qty 90, 90d supply, fill #1

## 2024-08-13 MED ORDER — OMEGA-3-ACID ETHYL ESTERS 1 G PO CAPS
1.0000 g | ORAL_CAPSULE | Freq: Two times a day (BID) | ORAL | 2 refills | Status: DC
  Filled 2024-08-13: qty 60, 30d supply, fill #0
  Filled 2024-09-13: qty 60, 30d supply, fill #1
  Filled 2024-10-11: qty 60, 30d supply, fill #2

## 2024-08-13 MED ORDER — SPIRONOLACTONE 25 MG PO TABS
25.0000 mg | ORAL_TABLET | Freq: Every day | ORAL | 2 refills | Status: DC
  Filled 2024-08-13: qty 30, 30d supply, fill #0

## 2024-08-13 MED ORDER — CETIRIZINE HCL 10 MG PO TABS
10.0000 mg | ORAL_TABLET | Freq: Every day | ORAL | 0 refills | Status: DC | PRN
  Filled 2024-08-13: qty 30, 30d supply, fill #0

## 2024-08-17 ENCOUNTER — Telehealth: Payer: Self-pay

## 2024-08-17 ENCOUNTER — Other Ambulatory Visit: Payer: Self-pay

## 2024-08-17 NOTE — Telephone Encounter (Signed)
 Pharmacy Patient Advocate Encounter   Received notification from CoverMyMeds that prior authorization for OZEMPIC  is required/requested.   Insurance verification completed.   The patient is insured through [facility] MEDICAID .   Per test claim: PA required; PA submitted to above mentioned insurance via [facility] Tracks Key/confirmation #/EOC [id number] W Status is pending

## 2024-08-18 ENCOUNTER — Other Ambulatory Visit: Payer: Self-pay

## 2024-08-19 ENCOUNTER — Other Ambulatory Visit: Payer: Self-pay

## 2024-08-25 ENCOUNTER — Other Ambulatory Visit: Payer: Self-pay

## 2024-08-26 ENCOUNTER — Other Ambulatory Visit: Payer: Self-pay

## 2024-08-27 ENCOUNTER — Encounter: Payer: Self-pay | Admitting: Pharmacist

## 2024-08-27 ENCOUNTER — Other Ambulatory Visit: Payer: Self-pay

## 2024-09-07 ENCOUNTER — Other Ambulatory Visit (HOSPITAL_COMMUNITY): Payer: Self-pay

## 2024-09-08 ENCOUNTER — Ambulatory Visit: Payer: Self-pay

## 2024-09-08 ENCOUNTER — Other Ambulatory Visit: Payer: Self-pay

## 2024-09-08 ENCOUNTER — Other Ambulatory Visit (HOSPITAL_COMMUNITY): Payer: Self-pay

## 2024-09-08 VITALS — BP 127/63 | HR 79 | Wt 247.6 lb

## 2024-09-08 DIAGNOSIS — E1149 Type 2 diabetes mellitus with other diabetic neurological complication: Secondary | ICD-10-CM | POA: Diagnosis not present

## 2024-09-08 DIAGNOSIS — Z794 Long term (current) use of insulin: Secondary | ICD-10-CM | POA: Diagnosis not present

## 2024-09-08 DIAGNOSIS — E118 Type 2 diabetes mellitus with unspecified complications: Secondary | ICD-10-CM | POA: Diagnosis not present

## 2024-09-08 DIAGNOSIS — E1165 Type 2 diabetes mellitus with hyperglycemia: Secondary | ICD-10-CM

## 2024-09-08 DIAGNOSIS — R7309 Other abnormal glucose: Secondary | ICD-10-CM

## 2024-09-08 MED ORDER — OZEMPIC (1 MG/DOSE) 4 MG/3ML ~~LOC~~ SOPN
1.0000 mg | PEN_INJECTOR | SUBCUTANEOUS | 5 refills | Status: AC
  Filled 2024-09-08 – 2024-09-14 (×2): qty 3, 28d supply, fill #0
  Filled 2024-10-11: qty 3, 28d supply, fill #1
  Filled 2024-11-05: qty 3, 28d supply, fill #2
  Filled 2024-12-08: qty 3, 28d supply, fill #3
  Filled 2024-12-31: qty 3, 28d supply, fill #4
  Filled 2025-01-31: qty 3, 28d supply, fill #5

## 2024-09-08 MED ORDER — GLIPIZIDE 10 MG PO TABS
10.0000 mg | ORAL_TABLET | Freq: Every day | ORAL | 0 refills | Status: DC
  Filled 2024-09-08 – 2024-10-11 (×2): qty 90, 90d supply, fill #0

## 2024-09-08 NOTE — Progress Notes (Signed)
 09/08/2024 Name: Jared Hart MRN: [id number] DOB: [birth year]  Chief Complaint  Patient presents with   Diabetes   Congestive Heart Failure   Hyperlipidemia    Jared Hart is a 58 y.o. year old male who presented for a face-to-face visit.    They were referred to the pharmacist by their PCP for assistance in managing diabetes, hypertension, and hyperlipidemia. PMH includes HTN, HFrEF (EF 40-45% in 2017), TIA and CVA (2023), T2DM, HLD, BMI > 30.    Subjective: Patient was seen by PCP, Bascom Borer, NP on 05/04/24, at which time his BP was elevated. He was last engaged by pharmacy via telephone on 06/01/24. He reported that he was interested in starting a medication for weight loss, as he had been feeling very fatigued recently. He was instructed to start Ozempic  for weight and diabetes. He was instructed to increase losartan  to 50 mg daily for elevated BP. BP at urology appointment was 115/69 mmHg. He was seen for pharmacy f/u in person on 07/14/24. BP was controlled and he was tolerating Ozempic  0.5 mg weekly well, with some appetite suppression. However, A1C was still elevated at 7.8%. He was instructed to start Farxiga  10 mg daily for DM, HFrEF, and microalbuminuria. His repeat BMP and lipid panel at PCP appt on 08/05/24 demonstrated stable SCR and K after increasing losartan . TG were improved and LDL-C was at goal.   Today, patient reports that he is doing ok. Tolerating his medications well and taking them as prescribed. He reports that he has lost weight.  Care Team: Primary Care Provider: Borer Bascom RAMAN, NP ; Next Scheduled Visit: 08/05/24 Urology Provider: Glendia Barba, MD, not scheduled  Medication Access/Adherence  Current Pharmacy:  [facility] [address], [address] [place] [address] Phone: [phone] Fax: [phone]   Patient reports affordability concerns with their medications: No  Patient reports  access/transportation concerns to their pharmacy: No  Patient reports adherence concerns with their medications:  No     Diabetes:  Current medications: metformin  XR 1000 mg BID, insulin  glargine (Lantus ) 30 units daily, glipizide  IR 10 mg BID with meals (sometimes skips breakfast, but still takes glipizide ), Ozempic  0.5 mg weekly (Thursdays), Farxiga  10 mg daily Medications tried in the past: N/A  Reports mild appetite suppression Ozempic  0.5 mg weekly, but still able to eat regularly throughout the day. Denies nausea, vomiting. He takes linzess  for constipation.   Current glucose readings: Reports BG was 80 in the middle of the day last week. BG usually 100-120. Denies BG > 200 mg/dL. Denies BG < 70 mg/dL.  Does not have glucometer with him today. Using True Metrix meter; testing twice times daily  Patient denies hypoglycemic s/sx including dizziness, shakiness, sweating. Patient denies hyperglycemic symptoms including polyuria, polydipsia, nocturia, neuropathy, blurred vision.   Current meal patterns: Typically 2 meals daily - Breakfast: bowl of cheerios, sometimes skips breakfast. - Dinner: last night two corn dogs, a couple fries - Drinks: drinking water throghout the day  Current physical activity: limited due to fatigue and back pain. Has been walking for exercise recently.  Current medication access support: none- Medicaid insurance  Hypertension/HFrEF:  Current medications: carvedilol  25 mg BID, losartan  50 mg daily, spironolactone  25 mg daily, furosemide  40 mg daily PRN (taking every other day), Farxiga  10 mg daily  Patient has a validated, automated, upper arm home BP cuff - checking nightly Current blood pressure readings readings: Pt reports home BP are always  less than 130/80   Patient denies hypotensive s/sx including dizziness, lightheadedness.  Patient denies hypertensive symptoms including headache, chest pain, shortness of breath  Patient takes weight daily.  Currently stable. No issues with fluid accumulation in LE. Taking furosemide  every other day.    Hyperlipidemia/ASCVD Risk Reduction  Current lipid lowering medications: atorvastatin  80 mg daily  Antiplatelet regimen: aspirin  325 mg daily  ASCVD History: CVA Risk Factors: T2DM, HTN, ASCVD hx, HFrEF  Clinical ASCVD: Yes  The ASCVD Risk score (Arnett DK, et al., 2019) failed to calculate for the following reasons:   Risk score cannot be calculated because patient has a medical history suggesting prior/existing ASCVD   Objective:  BP Readings from Last 3 Encounters:  09/08/24 127/63  08/05/24 114/64  07/14/24 121/62    Lab Results  Component Value Date   HGBA1C 7.8 (A) 07/14/2024   HGBA1C 7.7 (A) 02/09/2024   HGBA1C 8.4 (A) 11/10/2023     Lab Results  Component Value Date   CREATININE 1.01 08/05/2024   BUN 12 08/05/2024   NA 140 08/05/2024   K 3.9 08/05/2024   CL 99 08/05/2024   CO2 21 08/05/2024    Lab Results  Component Value Date   CHOL 80 (L) 08/05/2024   HDL 22 (L) 08/05/2024   LDLCALC 22 08/05/2024   LDLDIRECT 81.4 11/01/2013   TRIG 233 (H) 08/05/2024   CHOLHDL 3.6 08/05/2024    Medications Reviewed Today     Reviewed by Brinda Lorain SQUIBB, RPH (Pharmacist) on 09/08/24 at 1446  Med List Status: <None>   Medication Order Taking? Sig Documenting Provider Last Dose Status Informant  amLODipine  (NORVASC ) 10 MG tablet [id number] Yes Take 1 tablet (10 mg total) by mouth daily. Oley Bascom RAMAN, NP  Active   aspirin  325 MG tablet [id number]  Take 325 mg by mouth daily. [provider]  Active   atorvastatin  (LIPITOR) 80 MG tablet [id number] Yes Take 1 tablet (80 mg total) by mouth daily. Oley Bascom RAMAN, NP  Active   blood glucose meter kit and supplies [id number]  Dispense based on patient and insurance preference. Use up to four times daily as directed. (FOR ICD-10 E10.9, E11.9). Stroud, Natalie M, FNP  Active Self    Discontinued 09/08/24 1352 (No longer  needed (for PRN medications))   carvedilol  (COREG ) 25 MG tablet [id number] Yes Take 1 tablet (25 mg total) by mouth 2 (two) times daily with a meal. Oley Bascom RAMAN, NP  Active   cetirizine  (ZYRTEC ) 10 MG tablet [id number]  Take 1 tablet (10 mg total) by mouth daily as needed. Oley Bascom RAMAN, NP  Active   cyclobenzaprine  (FLEXERIL ) 5 MG tablet [id number]  Take 1 tablet (5 mg total) by mouth 3 (three) times daily for lower back spasms.   Active   dapagliflozin  propanediol (FARXIGA ) 10 MG TABS tablet [id number] Yes Take 1 tablet (10 mg total) by mouth daily. Oley Bascom RAMAN, NP  Active   fluticasone  (FLONASE ) 50 MCG/ACT nasal spray [id number]  Place 2 sprays into both nostrils daily.   Active   furosemide  (LASIX ) 40 MG tablet [id number] Yes Take 1 tablet (40 mg total) by mouth daily as needed for fluid ((weight gain of 2 lbs in one day or 5 lbs in one week)). Oley Bascom RAMAN, NP  Active   gabapentin  (NEURONTIN ) 100 MG capsule [id number] Yes Take 1 capsule (100 mg total) by mouth 2 (two) times daily. Oley Bascom RAMAN, NP  Active   glipiZIDE  (GLUCOTROL ) 10 MG  tablet [id number]  Take 1 tablet (10 mg total) by mouth daily with supper. Oley Bascom RAMAN, NP  Active   insulin  glargine (LANTUS  SOLOSTAR) 100 UNIT/ML Solostar Pen [id number] Yes Inject 30 Units into the skin at bedtime. Oley Bascom RAMAN, NP  Active      Discontinued 09/08/24 1353 (No longer needed (for PRN medications))   Insulin  Pen Needle (ULTICARE MINI PEN NEEDLES) 31G X 6 MM MISC [id number]  Use as directed Arloa Suzen RAMAN, NP  Active Self  lidocaine  (LIDODERM ) 5 % [id number]  Place 1 patch onto the skin daily. Oley Bascom RAMAN, NP  Active   linaclotide  (LINZESS ) 145 MCG CAPS capsule [id number] Yes Take 1 capsule (145 mcg total) by mouth daily before breakfast. May, Deanna J, NP  Active   losartan  (COZAAR ) 50 MG tablet [id number] Yes Take 1 tablet (50 mg total) by mouth daily. Nichols, Tonya S, NP  Active   metFORMIN  (GLUCOPHAGE -XR) 500 MG 24 hr  tablet [id number] Yes Take 2 tablets (1,000 mg total) by mouth 2 (two) times daily with a meal. Oley Bascom RAMAN, NP  Active     Discontinued 09/08/24 1353 (Completed Course)   Multiple Vitamin (MULTIVITAMIN WITH MINERALS) TABS tablet [id number]  Take 1 tablet by mouth daily with breakfast. [provider]  Active Self  omega-3 acid ethyl esters (LOVAZA ) 1 g capsule [id number] Yes Take 1 capsule (1 g total) by mouth 2 (two) times daily. Oley Bascom RAMAN, NP  Active   Semaglutide , 1 MG/DOSE, (OZEMPIC , 1 MG/DOSE,) 4 MG/3ML SOPN [id number] Yes Inject 1 mg into the skin once a week. Oley Bascom RAMAN, NP  Active    Discontinued 09/08/24 1354 (Dose change)   senna (SENOKOT) 8.6 MG tablet [id number]  Take 1 tablet (8.6 mg total) by mouth 2 (two) times daily. Oley Bascom RAMAN, NP  Active   spironolactone  (ALDACTONE ) 25 MG tablet [id number] Yes Take 1 tablet (25 mg total) by mouth daily. Oley Bascom RAMAN, NP  Active   tadalafil  (CIALIS ) 20 MG tablet [id number]  Take 1 tablet (20 mg total) by mouth daily as needed for erectile dysfunction. Oley Bascom RAMAN, NP  Active   tiZANidine  (ZANAFLEX ) 4 MG tablet [id number]  Take 1 tablet (4 mg total) by mouth 3 (three) times daily as needed for muscle spasms. Oley Bascom RAMAN, NP  Active   Med List Note Chari, [place], [place] 03/01/22 [id number]):                Assessment/Plan:   Diabetes: - Currently uncontrolled with A1C 7.8% above goal < 7%, but currently controlled per self-monitored BG values. Patient is tolerating Ozempic  0.5 mg weekly well and is losing weight (down 5% from [facility] on 08/05/24). He is a good candidate for GLP-1RA given known ASCVD history, elevated BMI and recent weight gain. Patient is also a good candidate for initiation of an SGLT2i with T2DM, HFrEF, and microalbuminuria - and he is tolerating this medication well. Will preemptively dose reduce glipizide  and insulin  to allow for increase in Ozempic .  - Reviewed long term cardiovascular and  renal outcomes of uncontrolled blood sugar - Reviewed goal A1c, goal fasting, and goal 2 hour post prandial glucose - Reviewed dietary modifications including - having small portions of high protein meals throughout the day, reducing carbohydrate heavy foods, drinking more water - Reviewed lifestyle modifications including: increasing physical activity - Recommend to decrease Lantus  to 26 units nightly - Recommend to increase Ozempic  to 1 mg weekly at next fill -  Recommend to decrease glipizide  IR 10 mg to once daily with evening meal. If next A1C is controlled, plan to discontinue to reduce risk of hypoglycemia.  - Recommend to continue metformin  XR 1000 mg BID - Patient denies personal or family history of multiple endocrine neoplasia type 2, medullary thyroid cancer; personal history of pancreatitis. He repots a personal history of cholecystectomy.  - Recommend to check glucose twice daily and keep a log. Instructed to bring glucometer to next appointment. Patient is a good candidate for a CGM - will discuss at follow-up.   Hypertension: - Currently controlled with home and clinic BP below goal < 130/80 mmHg . He appears euvolemic. He is taking furosemide  every other day, rather than daily as he was previously. Aware that he can take this PRN. He monitors his weight daily. Recent BMP was stable after increasing losartan  to 50 mg daily.  - Reviewed long term cardiovascular and renal outcomes of uncontrolled blood pressure - Reviewed appropriate blood pressure monitoring technique and reviewed goal blood pressure. Recommended to check home blood pressure and heart rate daily. - Recommend to carvedilol  25 mg BID, spironolactone  25 mg daily, losartan  50 mg daily, Farxiga  10 mg daily - Recommend to continue furosemide  to 40 mg daily PRN weight gain of 2-3 lbs in one day or 5 lbs in one week - Will attempt to reconnect with cardiology for repeat echo (last done in 2017)   Hyperlipidemia/ASCVD Risk  Reduction: - Currently controlled with LDL-C of 22 mg/dL below goal < 70 mg/dL and triglycerides improved from > 400 mg/dL to mid 799d on max statin + Lovaza . Appropriate to continue current regimen. - Reviewed long term complications of uncontrolled cholesterol - Recommend to continue atorvastatin  80 mg daily, Lovaza  1 g BID  Follow Up Plan: PCP 11/05/24, pharmacist telephone 10/20/24  Lorain Baseman, PharmD [facility] [phone]

## 2024-09-08 NOTE — Patient Instructions (Signed)
 It was nice to see you today!  Your goal blood sugar is 80-130 before eating and less than 180 after eating. Give us  a call if you start to have frequent low blood sugars less than 70 mg/dL or signs and symptoms of low blood sugar like feeling dizzy, shaky, sweaty.   Medication Changes: Decrease glipizide  XL to 10 mg once daily with your evening meal  Decrease Lantus  to 26 units once daily. If you have low blood sugars, you can further decrease to 24 units.   Increase Ozempic  to 1 mg once weekly at your next fill.   Continue all other medication the same.   Monitor blood sugars at home and keep a log (glucometer or piece of paper) to bring with you to your next visit.  Keep up the good work with diet and exercise. Aim for a diet full of vegetables, fruit and lean meats (chicken, [place], fish). Try to limit salt intake by eating fresh or frozen vegetables (instead of canned), rinse canned vegetables prior to cooking and do not add any additional salt to meals.

## 2024-09-09 ENCOUNTER — Other Ambulatory Visit: Payer: Self-pay

## 2024-09-13 ENCOUNTER — Other Ambulatory Visit: Payer: Self-pay

## 2024-09-13 ENCOUNTER — Other Ambulatory Visit: Payer: Self-pay | Admitting: Nurse Practitioner

## 2024-09-13 DIAGNOSIS — G8929 Other chronic pain: Secondary | ICD-10-CM

## 2024-09-13 MED ORDER — CETIRIZINE HCL 10 MG PO TABS
10.0000 mg | ORAL_TABLET | Freq: Every day | ORAL | 0 refills | Status: DC | PRN
  Filled 2024-09-13: qty 30, 30d supply, fill #0

## 2024-09-13 MED ORDER — GABAPENTIN 100 MG PO CAPS
100.0000 mg | ORAL_CAPSULE | Freq: Two times a day (BID) | ORAL | 2 refills | Status: DC
  Filled 2024-09-13: qty 60, 30d supply, fill #0
  Filled 2024-10-11: qty 60, 30d supply, fill #1
  Filled 2024-12-27: qty 60, 30d supply, fill #2

## 2024-09-13 NOTE — Telephone Encounter (Signed)
 Please advise [facility]

## 2024-09-14 ENCOUNTER — Other Ambulatory Visit: Payer: Self-pay

## 2024-10-11 ENCOUNTER — Other Ambulatory Visit: Payer: Self-pay | Admitting: Nurse Practitioner

## 2024-10-11 ENCOUNTER — Other Ambulatory Visit: Payer: Self-pay

## 2024-10-11 DIAGNOSIS — I10 Essential (primary) hypertension: Secondary | ICD-10-CM

## 2024-10-11 MED ORDER — FUROSEMIDE 40 MG PO TABS
40.0000 mg | ORAL_TABLET | Freq: Every day | ORAL | 2 refills | Status: AC | PRN
  Filled 2024-10-19: qty 40, 40d supply, fill #0
  Filled 2024-12-08: qty 40, 40d supply, fill #1
  Filled 2025-01-31: qty 40, 40d supply, fill #2

## 2024-10-11 MED ORDER — CETIRIZINE HCL 10 MG PO TABS
10.0000 mg | ORAL_TABLET | Freq: Every day | ORAL | 0 refills | Status: DC | PRN
  Filled 2024-10-11: qty 30, 30d supply, fill #0

## 2024-10-12 ENCOUNTER — Other Ambulatory Visit: Payer: Self-pay

## 2024-10-19 ENCOUNTER — Other Ambulatory Visit: Payer: Self-pay

## 2024-10-20 ENCOUNTER — Telehealth: Payer: Self-pay

## 2024-10-20 ENCOUNTER — Other Ambulatory Visit: Payer: Self-pay

## 2024-10-20 ENCOUNTER — Ambulatory Visit (INDEPENDENT_AMBULATORY_CARE_PROVIDER_SITE_OTHER): Payer: Self-pay

## 2024-10-20 VITALS — Wt 234.4 lb

## 2024-10-20 DIAGNOSIS — Z794 Long term (current) use of insulin: Secondary | ICD-10-CM

## 2024-10-20 DIAGNOSIS — E1149 Type 2 diabetes mellitus with other diabetic neurological complication: Secondary | ICD-10-CM | POA: Diagnosis not present

## 2024-10-20 LAB — POCT GLYCOSYLATED HEMOGLOBIN (HGB A1C): HbA1c, POC (controlled diabetic range): 6.1 % (ref 0.0–7.0)

## 2024-10-20 MED ORDER — FREESTYLE LIBRE 3 PLUS SENSOR MISC
3 refills | Status: DC
  Filled 2024-10-20 – 2024-10-28 (×2): qty 6, 90d supply, fill #0
  Filled 2024-10-28: qty 2, 28d supply, fill #0

## 2024-10-20 NOTE — Telephone Encounter (Signed)
 Pharmacy Patient Advocate Encounter   Received notification from CoverMyMeds that prior authorization for FREESTYLE LIBRE 3 PLUS SENSOR is required/requested.   Insurance verification completed.   The patient is insured through [facility] MEDICAID.   Per test claim: PA required; PA submitted to above mentioned insurance via [facility] Tracks Key/confirmation #/EOC [id number] W Status is pending

## 2024-10-20 NOTE — Patient Instructions (Addendum)
 It was nice to see you today!  Your goal blood sugar is 80-130 before eating and less than 180 after eating.  Medication Changes:  STOP glipizide    Decrease Lantus  to 24 units daily  Pick up the continuous glucose monitor (Freestyle Libre 3) from the pharmacy when you are able. Apply and connect to your phone as we discussed. I will give you a call to make sure you got connected.   Continue all other medication the same.   Monitor blood sugars at home and keep a log (glucometer or piece of paper) to bring with you to your next visit.  Keep up the good work with diet and exercise. Aim for a diet full of vegetables, fruit and lean meats (chicken, [place], fish). Try to limit salt intake by eating fresh or frozen vegetables (instead of canned), rinse canned vegetables prior to cooking and do not add any additional salt to meals.   Lorain Baseman, PharmD [facility] [phone]

## 2024-10-20 NOTE — Progress Notes (Signed)
 10/20/2024 Name: Jared Hart MRN: [id number] DOB: Dec 13, 1966  Chief Complaint  Patient presents with   Diabetes    Jared Hart is a 58 y.o. year old male who presented for a telephone visit.   They were referred to the pharmacist by their PCP for assistance in managing diabetes, hypertension, and hyperlipidemia. PMH includes HTN, HFrEF (EF 40-45% in 2017), TIA and CVA (2023), T2DM, HLD, BMI > 30.    Subjective: Patient was seen by PCP, Bascom Borer, NP on 05/04/24, at which time his BP was elevated. He was engaged by pharmacy via telephone on 06/01/24. He reported that he was interested in starting a medication for weight loss, as he had been feeling very fatigued recently. He was instructed to start Ozempic  for weight and diabetes. He was instructed to increase losartan  to 50 mg daily for elevated BP. BP at urology appointment was 115/69 mmHg. He was seen for pharmacy f/u in person on 07/14/24. BP was controlled and he was tolerating Ozempic  0.5 mg weekly well, with some appetite suppression. However, A1C was still elevated at 7.8%. He was instructed to start Farxiga  10 mg daily for DM, HFrEF, and microalbuminuria. His repeat BMP and lipid panel at PCP appt on 08/05/24 demonstrated stable SCR and K after increasing losartan . TG were improved and LDL-C was at goal. At in person pharmacy appt on 09/08/24, patient reported that he had lost weight. He was instructed to increase Ozempic  to 1 mg weekly, decrease Lantus  to 26 units daily, and decrease glipizide  to 10 mg once daily with plan to discontinue if next A1C was at goal.   Today, patient reports that he is doing well. Has continued to lose weight on Ozempic , but reports he is tolerating the 1 mg dose well from a GI standpoint. He has started to notice that sometimes he will feel shaky during the day and then it goes away Occasionally associated with dizziness/lightheadedness. Has not checked his BG during these episodes. Unable to really associate  it with hunger/needing to eat.   Care Team: Primary Care Provider: Borer Bascom RAMAN, NP ; Next Scheduled Visit: 08/05/24 Urology Provider: Glendia Barba, MD, not scheduled  Medication Access/Adherence  Current Pharmacy:  [facility] [address], [address] [place] [address] Phone: [phone] Fax: [phone]   Patient reports affordability concerns with their medications: No  Patient reports access/transportation concerns to their pharmacy: No  Patient reports adherence concerns with their medications:  No     Diabetes:  Current medications: metformin  XR 1000 mg BID, insulin  glargine (Lantus ) 26 units daily, glipizide  IR 10 mg once daily, Ozempic  1 mg weekly (Thursdays), Farxiga  10 mg daily Medications tried in the past: N/A  Reports some appetite suppression Ozempic  1 mg weekly. Really only eating one main meal during the day. Denies nausea, vomiting. He takes linzess  for constipation.   Current glucose readings: BG 80-140 mg/dL. Denies BG < 70 mg/dL. Does not have glucometer with him today.  Using True Metrix meter; testing twice times daily  Patient endorses hypoglycemic s/sx including shakiness. Patient denies hyperglycemic symptoms including polyuria, polydipsia, nocturia, neuropathy, blurred vision.   Current meal patterns: Typically 1-2 meals daily - Breakfast: bowl of cheerios, sometimes skips breakfast. - Dinner: last night two corn dogs, a couple fries - Drinks: drinking water throghout the day  Current physical activity: Reports he has been walking a lot for exercise.  Current medication access support: none- Medicaid insurance  Hypertension/HFrEF:  Current medications: carvedilol  25 mg BID, losartan  50 mg daily, spironolactone  25 mg daily, furosemide  40 mg daily PRN (taking every other day), Farxiga  10 mg daily  Patient has a validated, automated, upper arm home BP cuff - checking nightly Current blood  pressure readings readings: Pt reports home BP are always less than 130/80   Patient denies hypotensive s/sx including dizziness, lightheadedness.  Patient denies hypertensive symptoms including headache, chest pain, shortness of breath  Patient takes weight daily. Currently stable. No issues with fluid accumulation in LE. Taking furosemide  every other day.    Hyperlipidemia/ASCVD Risk Reduction  Current lipid lowering medications: atorvastatin  80 mg daily, Lovaza  1 g BID  Antiplatelet regimen: aspirin  325 mg daily  ASCVD History: CVA Risk Factors: T2DM, HTN, ASCVD hx, HFrEF  Clinical ASCVD: Yes  The ASCVD Risk score (Arnett DK, et al., 2019) failed to calculate for the following reasons:   Risk score cannot be calculated because patient has a medical history suggesting prior/existing ASCVD   Objective:  Baseline weight: 261 lbs (05/04/24) > 247 lbs (09/08/24) > 234.4 lbs (10/20/24)  BP Readings from Last 3 Encounters:  09/08/24 127/63  08/05/24 114/64  07/14/24 121/62    Lab Results  Component Value Date   HGBA1C 6.1 10/20/2024   HGBA1C 7.8 (A) 07/14/2024   HGBA1C 7.7 (A) 02/09/2024     Lab Results  Component Value Date   CREATININE 1.01 08/05/2024   BUN 12 08/05/2024   NA 140 08/05/2024   K 3.9 08/05/2024   CL 99 08/05/2024   CO2 21 08/05/2024    Lab Results  Component Value Date   CHOL 80 (L) 08/05/2024   HDL 22 (L) 08/05/2024   LDLCALC 22 08/05/2024   LDLDIRECT 81.4 11/01/2013   TRIG 233 (H) 08/05/2024   CHOLHDL 3.6 08/05/2024    Medications Reviewed Today     Reviewed by Brinda Lorain SQUIBB, RPH (Pharmacist) on 10/20/24 at 1343  Med List Status: <None>   Medication Order Taking? Sig Documenting Provider Last Dose Status Informant  amLODipine  (NORVASC ) 10 MG tablet [id number] Yes Take 1 tablet (10 mg total) by mouth daily. Oley Bascom RAMAN, NP  Active   aspirin  325 MG tablet [id number] Yes Take 325 mg by mouth daily. [provider]  Active    atorvastatin  (LIPITOR) 80 MG tablet [id number] Yes Take 1 tablet (80 mg total) by mouth daily. Oley Bascom RAMAN, NP  Active   blood glucose meter kit and supplies [id number]  Dispense based on patient and insurance preference. Use up to four times daily as directed. (FOR ICD-10 E10.9, E11.9). Stroud, Natalie M, FNP  Active Self  carvedilol  (COREG ) 25 MG tablet [id number] Yes Take 1 tablet (25 mg total) by mouth 2 (two) times daily with a meal. Oley Bascom RAMAN, NP  Active   cetirizine  (ZYRTEC ) 10 MG tablet [id number]  Take 1 tablet (10 mg total) by mouth daily as needed. Oley Bascom RAMAN, NP  Active   Continuous Glucose Sensor (FREESTYLE LIBRE 3 PLUS SENSOR) [place] [id number] Yes Change sensor every 15 days. Oley Bascom RAMAN, NP  Active   cyclobenzaprine  (FLEXERIL ) 5 MG tablet [id number]  Take 1 tablet (5 mg total) by mouth 3 (three) times daily for lower back spasms.   Active   dapagliflozin  propanediol (FARXIGA ) 10 MG TABS tablet [id number] Yes Take 1 tablet (10 mg total) by mouth daily. Nichols, Tonya S, NP  Active   fluticasone  (FLONASE ) 50 MCG/ACT nasal spray [id number]  Place 2 sprays into both  nostrils daily.   Active   furosemide  (LASIX ) 40 MG tablet [id number] Yes Take 1 tablet (40 mg total) by mouth daily as needed for fluid ((weight gain of 2 lbs in one day or 5 lbs in one week)). Oley Bascom RAMAN, NP  Active   gabapentin  (NEURONTIN ) 100 MG capsule [id number] Yes Take 1 capsule (100 mg total) by mouth 2 (two) times daily. Oley Bascom RAMAN, NP  Active     Discontinued 10/20/24 1337 (Change in therapy)   insulin  glargine (LANTUS  SOLOSTAR) 100 UNIT/ML Solostar Pen [id number] Yes Inject 30 Units into the skin at bedtime.  Patient taking differently: Inject 30 Units into the skin at bedtime.   Oley Bascom RAMAN, NP  Active   Insulin  Pen Needle [place] MINI PEN NEEDLES) 31G X 6 MM MISC [id number]  Use as directed Arloa Suzen RAMAN, NP  Active Self  lidocaine  (LIDODERM ) 5 % [id number]  Place 1 patch onto the  skin daily. Oley Bascom RAMAN, NP  Active   linaclotide  (LINZESS ) 145 MCG CAPS capsule [id number] Yes Take 1 capsule (145 mcg total) by mouth daily before breakfast. May, Deanna J, NP  Active   losartan  (COZAAR ) 50 MG tablet [id number] Yes Take 1 tablet (50 mg total) by mouth daily. Nichols, Tonya S, NP  Active   metFORMIN  (GLUCOPHAGE -XR) 500 MG 24 hr tablet [id number] Yes Take 2 tablets (1,000 mg total) by mouth 2 (two) times daily with a meal. Nichols, Tonya S, NP  Active   Multiple Vitamin (MULTIVITAMIN WITH MINERALS) TABS tablet [id number]  Take 1 tablet by mouth daily with breakfast. [provider]  Active Self  omega-3 acid ethyl esters (LOVAZA ) 1 g capsule [id number] Yes Take 1 capsule (1 g total) by mouth 2 (two) times daily. Oley Bascom RAMAN, NP  Active   Semaglutide , 1 MG/DOSE, (OZEMPIC , 1 MG/DOSE,) 4 MG/3ML SOPN [id number] Yes Inject 1 mg into the skin once a week. Oley Bascom RAMAN, NP  Active   senna (SENOKOT) 8.6 MG tablet [id number]  Take 1 tablet (8.6 mg total) by mouth 2 (two) times daily. Oley Bascom RAMAN, NP  Active   spironolactone  (ALDACTONE ) 25 MG tablet [id number] Yes Take 1 tablet (25 mg total) by mouth daily. Oley Bascom RAMAN, NP  Active   tadalafil  (CIALIS ) 20 MG tablet [id number]  Take 1 tablet (20 mg total) by mouth daily as needed for erectile dysfunction. Oley Bascom RAMAN, NP  Active   tiZANidine  (ZANAFLEX ) 4 MG tablet [id number]  Take 1 tablet (4 mg total) by mouth 3 (three) times daily as needed for muscle spasms. Oley Bascom RAMAN, NP  Active   Med List Note Chari, Lafayette I, NP 03/01/22 [id number]):                Assessment/Plan:   Diabetes: - Currently controlled with A1C 6.1% below goal < 7%. Congratulated patient on improvement. He is tolerating Ozempic  1 mg well, but discussed today the importance of maintaining protein intake throughout the day. He describes some random shakiness that has started occurring throughout the day, which could be hypoglycemia (no  SMBG to associate) or muscle weakness with ongoing weight loss. Encouraged patient to engage in strength training exercises. He continues to be a good candidate for GLP-1RA given known ASCVD history, elevated BMI. Patient is also a good candidate for initiation of an SGLT2i with T2DM, HFrEF, and microalbuminuria - and he is tolerating this medication well. With A1C well below goal, will stop glipizide , which should reduce risk of hypoglycemia  and slightly reduce Lantus . Patient agreeable to initiating CGM for improved monitoring.  - Reviewed long term cardiovascular and renal outcomes of uncontrolled blood sugar - Reviewed goal A1c, goal fasting, and goal 2 hour post prandial glucose - Reviewed dietary modifications including - having small portions of high protein meals throughout the day, reducing carbohydrate heavy foods, drinking more water - Reviewed lifestyle modifications including: increasing physical activity - Recommend to decrease Lantus  to 24 units nightly - Recommend to continue Ozempic  1 mg weekly - Recommend to STOP glipizide   - Recommend to continue metformin  XR 1000 mg BID, Farxiga  10 mg daily - Patient denies personal or family history of multiple endocrine neoplasia type 2, medullary thyroid cancer; personal history of pancreatitis. He repots a personal history of cholecystectomy.  - Recommend to check glucose continuously with FL3+ CGM. [employer] and created account for patient today. Educated on sensor application and use today. He will pick up from the pharmacy this week.   Hypertension: - Currently controlled with home and clinic BP below goal < 130/80 mmHg . He appears euvolemic. He is taking furosemide  every other day, rather than daily as he was previously. Aware that he can take this PRN. He monitors his weight daily. - Reviewed long term cardiovascular and renal outcomes of uncontrolled blood pressure - Reviewed appropriate blood pressure monitoring technique and  reviewed goal blood pressure. Recommended to check home blood pressure and heart rate daily. - Recommend to carvedilol  25 mg BID, spironolactone  25 mg daily, losartan  50 mg daily, Farxiga  10 mg daily - Recommend to continue furosemide  to 40 mg daily PRN weight gain of 2-3 lbs in one day or 5 lbs in one week - Consider reconnecting with cardiology for repeat echo at some point, though patient is not symptomatic at this time.   Hyperlipidemia/ASCVD Risk Reduction: - Currently controlled with LDL-C of 22 mg/dL below goal < 70 mg/dL and triglycerides improved from > 400 mg/dL to mid 799d on max statin + Lovaza . Appropriate to continue current regimen. - Reviewed long term complications of uncontrolled cholesterol - Recommend to continue atorvastatin  80 mg daily, Lovaza  1 g BID  Follow Up Plan: PCP 11/05/24, pharmacist in person 12/15/24  Lorain Baseman, PharmD [facility] [phone]

## 2024-10-22 ENCOUNTER — Other Ambulatory Visit: Payer: Self-pay

## 2024-10-25 ENCOUNTER — Other Ambulatory Visit: Payer: Self-pay

## 2024-10-26 ENCOUNTER — Telehealth: Payer: Self-pay | Admitting: Pharmacy Technician

## 2024-10-26 ENCOUNTER — Other Ambulatory Visit: Payer: Self-pay

## 2024-10-26 NOTE — Telephone Encounter (Signed)
 Pharmacy Patient Advocate Encounter   Received notification from CoverMyMeds that prior authorization for FreeStyle Libre 3 Plus Sensor  is required/requested.   Insurance verification completed.   The patient is insured through [facility] MEDICAID.   Per test claim: PA required; PA submitted to above mentioned insurance via Fax Key/confirmation #/EOC [id number] Status is pending

## 2024-10-27 ENCOUNTER — Other Ambulatory Visit (HOSPITAL_COMMUNITY): Payer: Self-pay

## 2024-10-27 ENCOUNTER — Other Ambulatory Visit: Payer: Self-pay

## 2024-10-28 ENCOUNTER — Other Ambulatory Visit: Payer: Self-pay

## 2024-10-28 NOTE — Telephone Encounter (Signed)
 Pharmacy Patient Advocate Encounter  Received notification from [place] MEDICAID that Prior Authorization for FreeStyle Libre 3 Plus Sensor  has been APPROVED from 10/27/24 to 10/22/25. Ran test claim, Copay is $0.00. This test claim was processed through [facility]- copay amounts may vary at other pharmacies due to pharmacy/plan contracts, or as the patient moves through the different stages of their insurance plan.   PA #/Case ID/Reference #: [id number]

## 2024-10-29 ENCOUNTER — Other Ambulatory Visit: Payer: Self-pay

## 2024-11-02 ENCOUNTER — Other Ambulatory Visit: Payer: Self-pay

## 2024-11-03 ENCOUNTER — Other Ambulatory Visit: Payer: Self-pay

## 2024-11-05 ENCOUNTER — Encounter: Payer: Self-pay | Admitting: Nurse Practitioner

## 2024-11-05 ENCOUNTER — Ambulatory Visit: Payer: Self-pay | Admitting: Nurse Practitioner

## 2024-11-05 ENCOUNTER — Other Ambulatory Visit: Payer: Self-pay

## 2024-11-05 VITALS — BP 108/65 | HR 75 | Ht 73.0 in | Wt 231.0 lb

## 2024-11-05 DIAGNOSIS — Z794 Long term (current) use of insulin: Secondary | ICD-10-CM

## 2024-11-05 DIAGNOSIS — E1149 Type 2 diabetes mellitus with other diabetic neurological complication: Secondary | ICD-10-CM

## 2024-11-05 DIAGNOSIS — Z23 Encounter for immunization: Secondary | ICD-10-CM | POA: Diagnosis not present

## 2024-11-05 MED ORDER — FREESTYLE LIBRE 3 PLUS SENSOR MISC
3 refills | Status: AC
  Filled 2024-11-05: qty 2, 30d supply, fill #0
  Filled 2024-11-05: qty 6, fill #0
  Filled 2024-11-16: qty 6, 90d supply, fill #0
  Filled 2024-11-17: qty 2, 30d supply, fill #0
  Filled 2024-11-19: qty 6, 90d supply, fill #0
  Filled 2025-01-14 – 2025-02-02 (×2): qty 6, 90d supply, fill #1

## 2024-11-05 NOTE — Progress Notes (Signed)
 Patient saw PCP today. Reported he continued have low blood sugars despite discontinuing glipizide . PCP asked for recommendations. Per AGP, patient is having some hypoglycemia. Otherwise, has had excellent glycemic control. Advised to DECREASE Lantus  to 20 units daily. Advised him to call back if he continues to have hypoglycemia.      Lorain Baseman, PharmD [facility] [phone]
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
## 2024-11-08 ENCOUNTER — Telehealth: Payer: Self-pay

## 2024-11-08 NOTE — Telephone Encounter (Signed)
 Called patient to follow-up on hypoglycemia after decreasing Lantus  to 20 units daily. Patient informs me that he has not taken any insulin  in 2 weeks due to frequent low BG alarms on his CGM. This was not communicated to me by PCP during consult on 11/05/24. Despite not taking insulin , patient had prolonged BG < 70 mg/dL yesterday afternoon. He reports he has been double checking with glucometer and still seeing numbers < 70 mg/dL. He reports he is eating less on Ozempic  but still eating regularly every 4-5 hours. He is having some s/sx of hypoglycemia (shakiness, dizziness) with BG < 70 mg/dL. Advised patient to continue HOLDING Lantus  (insulin  glargine) and DECREASE metformin  to 500 mg BID. Will follow-up next week. If hypoglycemia has not resolved, will consider discontinuing metformin  vs decreasing dose of Ozempic .        Lorain Baseman, PharmD [facility] [phone]

## 2024-11-16 ENCOUNTER — Other Ambulatory Visit: Payer: Self-pay

## 2024-11-16 ENCOUNTER — Other Ambulatory Visit: Payer: Self-pay | Admitting: Nurse Practitioner

## 2024-11-16 MED ORDER — CETIRIZINE HCL 10 MG PO TABS
10.0000 mg | ORAL_TABLET | Freq: Every day | ORAL | 0 refills | Status: DC | PRN
  Filled 2024-11-16: qty 30, 30d supply, fill #0

## 2024-11-17 ENCOUNTER — Other Ambulatory Visit: Payer: Self-pay

## 2024-11-19 ENCOUNTER — Other Ambulatory Visit: Payer: Self-pay

## 2024-11-24 ENCOUNTER — Other Ambulatory Visit: Payer: Self-pay

## 2024-11-24 MED ORDER — TIZANIDINE HCL 2 MG PO TABS
2.0000 mg | ORAL_TABLET | Freq: Four times a day (QID) | ORAL | 2 refills | Status: AC | PRN
  Filled 2024-11-24 (×2): qty 30, 8d supply, fill #0

## 2024-12-03 ENCOUNTER — Other Ambulatory Visit: Payer: Self-pay

## 2024-12-08 ENCOUNTER — Other Ambulatory Visit: Payer: Self-pay

## 2024-12-13 ENCOUNTER — Telehealth: Payer: Self-pay

## 2024-12-13 NOTE — Telephone Encounter (Signed)
 Copied from CRM #[id number]. Topic: Clinical - Medication Question >> Dec 13, 2024 11:46 AM Jared Hart wrote: Reason for CRM: sensor stopped working on Saturday, asking if he needs to put a new one on early, shows sensor ended- [phone]    Pt wife was advised that pt can apply another sensor. Pt was also advise to contact [facility] to obtain a replacement sensor. KH

## 2024-12-15 ENCOUNTER — Other Ambulatory Visit: Payer: Self-pay

## 2024-12-15 ENCOUNTER — Ambulatory Visit: Payer: Self-pay

## 2024-12-15 DIAGNOSIS — E1149 Type 2 diabetes mellitus with other diabetic neurological complication: Secondary | ICD-10-CM

## 2024-12-15 DIAGNOSIS — Z794 Long term (current) use of insulin: Secondary | ICD-10-CM | POA: Diagnosis not present

## 2024-12-15 MED ORDER — METFORMIN HCL ER 500 MG PO TB24
500.0000 mg | ORAL_TABLET | Freq: Every day | ORAL | 1 refills | Status: DC
  Filled 2024-12-15: qty 90, 90d supply, fill #0

## 2024-12-15 NOTE — Progress Notes (Signed)
 12/15/2024 Name: Jared Hart MRN: [id number] DOB: [birth year]  Chief Complaint  Patient presents with   Diabetes   Congestive Heart Failure    Jared Hart is a 58 y.o. year old male who presented for a face-to-face visit.   They were referred to the pharmacist by their PCP for assistance in managing diabetes, hypertension, and hyperlipidemia. PMH includes HTN, HFrEF (EF 40-45% in 2017), TIA and CVA (2023), T2DM, HLD, BMI > 30.    Subjective: Patient was seen by PCP, Bascom Borer, NP on 11/05/24. Last A1C was 6.1%. he reported hypoglycemia. I outreached him on 11/08/24. Patient reported he had not taken any Lantus  in 2 weeks. 2 week TIR was 89%, TBR 5%, TAR 6%. I instructed him to continue holding Lantus  and decrease metformin  to 500 mg BID. He was instructed to continue Ozempic  1 mg weekly, Farxiga  10 mg daily, and was encouraged to double check low CGM readings with glucometer.  Today, patient reports BG have continued to be controlled without Lantus . Still having some occasional lightheadedness. Sensor readings 74 during visit today (fasting). He decrease metformin  as previously instructed. Feels that weight loss has slowed a bit. Denies GI AE.    Care Team: Primary Care Provider: Borer Bascom RAMAN, NP ; Next Scheduled Visit: 08/05/24 Urology Provider: Glendia Barba, MD, not scheduled  Medication Access/Adherence  Current Pharmacy:  [facility] [address], [address] [place] [address] Phone: [phone] Fax: [phone]   Patient reports affordability concerns with their medications: No  Patient reports access/transportation concerns to their pharmacy: No  Patient reports adherence concerns with their medications:  No     Diabetes:  Current medications: metformin  XR 500 mg BID, Ozempic  1 mg weekly (Thursdays), Farxiga  10 mg daily Medications tried in the past: N/A  Reports some appetite suppression Ozempic  1 mg  weekly. Appetite is stabilized.  Reports eating small portions throughout the day. Denies nausea, vomiting. He takes linzess  for constipation.   Current glucose readings: Using Freestyle Libre 3+, Backup meter: TrueMetrix     Patient endorses hypoglycemic s/sx including shakiness, lightheadedness occasionally. Has not confirmed with finger sticks recently. Reports sensor usually wakes him up overnight but her feels fine. Patient denies hyperglycemic symptoms including polyuria, polydipsia, nocturia, neuropathy, blurred vision.   Current meal patterns: Typically 1-2 meals daily - Breakfast: bowl of cheerios, sometimes skips breakfast. 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 Also doing strength training.   Current medication access support: none- Medicaid insurance  Hypertension/HFrEF:  Current medications: carvedilol  25 mg BID, losartan  50 mg daily, spironolactone  25 mg daily, furosemide  40 mg daily PRN, Farxiga  10 mg daily  Patient has a validated, automated, upper arm home BP cuff - checking nightly Current blood pressure readings readings: Pt reports home BP are always less than 130/80   Patient endorses hypotensive s/sx including occasionally dizziness, lightheadedness.  Patient denies hypertensive symptoms including headache, chest pain, shortness of breath  Patient takes weight daily. Currently stable. No issues with fluid accumulation in LE.    Hyperlipidemia/ASCVD Risk Reduction  Current lipid lowering medications: atorvastatin  80 mg daily, Lovaza  1 g BID  Antiplatelet regimen: aspirin  325 mg daily  ASCVD History: CVA Risk Factors: T2DM, HTN, ASCVD hx, HFrEF  Clinical ASCVD: Yes  The ASCVD Risk score (  Arnett DK, et al., 2019) failed to calculate for the following reasons:   Risk score cannot be  calculated because patient has a medical history suggesting prior/existing ASCVD   * - Cholesterol units were assumed   Objective:  Baseline weight: 261 lbs (05/04/24) > 247 lbs (09/08/24) > 234.4 lbs (10/20/24) > 225.6 (12/15/24)  BP Readings from Last 3 Encounters:  12/15/24 115/65  11/05/24 108/65  09/08/24 127/63    Lab Results  Component Value Date   HGBA1C 6.1 10/20/2024   HGBA1C 7.8 (A) 07/14/2024   HGBA1C 7.7 (A) 02/09/2024     Lab Results  Component Value Date   CREATININE 1.01 08/05/2024   BUN 12 08/05/2024   NA 140 08/05/2024   K 3.9 08/05/2024   CL 99 08/05/2024   CO2 21 08/05/2024    Lab Results  Component Value Date   CHOL 80 (L) 08/05/2024   HDL 22 (L) 08/05/2024   LDLCALC 22 08/05/2024   LDLDIRECT 81.4 11/01/2013   TRIG 233 (H) 08/05/2024   CHOLHDL 3.6 08/05/2024    Medications Reviewed Today     Reviewed by Brinda Lorain SQUIBB, RPH-CPP (Pharmacist) on 12/15/24 at 1719  Med List Status: <None>   Medication Order Taking? Sig Documenting Provider Last Dose Status Informant  amLODipine  (NORVASC ) 10 MG tablet [id number] Yes Take 1 tablet (10 mg total) by mouth daily. Oley Bascom RAMAN, NP  Active   aspirin  325 MG tablet [id number] Yes Take 325 mg by mouth daily. [provider]  Active   atorvastatin  (LIPITOR) 80 MG tablet [id number] Yes Take 1 tablet (80 mg total) by mouth daily. Oley Bascom RAMAN, NP  Active   blood glucose meter kit and supplies [id number]  Dispense based on patient and insurance preference. Use up to four times daily as directed. (FOR ICD-10 E10.9, E11.9). Stroud, Natalie M, FNP  Active Self  carvedilol  (COREG ) 25 MG tablet [id number] Yes Take 1 tablet (25 mg total) by mouth 2 (two) times daily with a meal. Oley Bascom RAMAN, NP  Active   cetirizine  (ZYRTEC ) 10 MG tablet [id number]  Take 1 tablet (10 mg total) by mouth daily as needed. Oley Bascom RAMAN, NP  Active   Continuous Glucose Sensor (FREESTYLE LIBRE 3 PLUS SENSOR) [place] [id number] Yes  Change sensor every 15 days. Oley Bascom RAMAN, NP  Active   cyclobenzaprine  (FLEXERIL ) 5 MG tablet [id number]  Take 1 tablet (5 mg total) by mouth 3 (three) times daily for lower back spasms.   Active   dapagliflozin  propanediol (FARXIGA ) 10 MG TABS tablet [id number] Yes Take 1 tablet (10 mg total) by mouth daily. Nichols, Tonya S, NP  Active   fluticasone  (FLONASE ) 50 MCG/ACT nasal spray [id number]  Place 2 sprays into both nostrils daily.   Active   furosemide  (LASIX ) 40 MG tablet [id number] Yes Take 1 tablet (40 mg total) by mouth daily as needed for fluid ((weight gain of 2 lbs in one day or 5 lbs in one week)). Oley Bascom RAMAN, NP  Active   gabapentin  (NEURONTIN ) 100 MG capsule [id number]  Take 1 capsule (100 mg total) by mouth 2 (two) times daily. Oley Bascom RAMAN, NP  Active    Patient not taking:   Discontinued 12/15/24 1718 (Change in therapy)   Insulin  Pen Needle (ULTICARE MINI PEN NEEDLES) 31G X 6 MM MISC [id number]  Use as directed Arloa Suzen RAMAN, NP  Active Self  lidocaine  (LIDODERM ) 5 % [id number]  Place 1 patch onto the skin daily. Nichols, Tonya  S, NP  Active   linaclotide  (LINZESS ) 145 MCG CAPS capsule [id number] Yes Take 1 capsule (145 mcg total) by mouth daily before breakfast. May, Deanna J, NP  Active   losartan  (COZAAR ) 50 MG tablet [id number] Yes Take 1 tablet (50 mg total) by mouth daily. Oley Bascom RAMAN, NP  Active   metFORMIN  (GLUCOPHAGE -XR) 500 MG 24 hr tablet [id number] Yes Take 2 tablets (1,000 mg total) by mouth 2 (two) times daily with a meal. Nichols, Tonya S, NP  Active   Multiple Vitamin (MULTIVITAMIN WITH MINERALS) TABS tablet [id number]  Take 1 tablet by mouth daily with breakfast. [provider]  Active Self  omega-3 acid ethyl esters (LOVAZA ) 1 g capsule [id number]  Take 1 capsule (1 g total) by mouth 2 (two) times daily. Oley Bascom RAMAN, NP  Active   Semaglutide , 1 MG/DOSE, (OZEMPIC , 1 MG/DOSE,) 4 MG/3ML SOPN [id number] Yes Inject 1 mg into the skin once a  week. Oley Bascom RAMAN, NP  Active   senna (SENOKOT) 8.6 MG tablet [id number]  Take 1 tablet (8.6 mg total) by mouth 2 (two) times daily. Oley Bascom RAMAN, NP  Active   spironolactone  (ALDACTONE ) 25 MG tablet [id number] Yes Take 1 tablet (25 mg total) by mouth daily. Oley Bascom RAMAN, NP  Active   tadalafil  (CIALIS ) 20 MG tablet [id number]  Take 1 tablet (20 mg total) by mouth daily as needed for erectile dysfunction. Oley Bascom RAMAN, NP  Active   tiZANidine  (ZANAFLEX ) 2 MG tablet [id number]  Take 1 tablet (2 mg total) by mouth every 6-8 hours as needed.   Active   tiZANidine  (ZANAFLEX ) 4 MG tablet [id number]  Take 1 tablet (4 mg total) by mouth 3 (three) times daily as needed for muscle spasms. Oley Bascom RAMAN, NP  Active   Med List Note Chari, Olivet I, NP 03/01/22 [id number]):                Assessment/Plan:   Diabetes: - Currently controlled with A1C 6.1% below goal < 7%. Congratulated patient on improvement. Will continue to back off antihyperglycemic therapy, starting with metformin  given lack of long term benefits compared to Ozempic  and Farxiga . Encouraged continued protein intake and strength training given significant weight loss.  He continues to be a good candidate for GLP-1RA given known ASCVD history, elevated BMI. Can reduce Ozempic  to 0.5 mg in the future if hypoglycemia does not resolve. Patient is also a good candidate for initiation of an SGLT2i with T2DM, HFrEF, and microalbuminuria - and he is tolerating this medication well.  - Reviewed long term cardiovascular and renal outcomes of uncontrolled blood sugar - Reviewed goal A1c, goal fasting, and goal 2 hour post prandial glucose - Reviewed dietary modifications including - having small portions of high protein meals throughout the day, reducing carbohydrate heavy foods, drinking more water - Reviewed lifestyle modifications including: increasing physical activity - Removed Lantus  from medication list - Recommend to reduce  metformin  XR to 500 mg daily x 1 week. If still having hypoglycemia, discontinue.  - Recommend to continue Ozempic  1 mg weekly - Recommend to continue Farxiga  10 mg daily - Patient denies personal or family history of multiple endocrine neoplasia type 2, medullary thyroid cancer; personal history of pancreatitis. He repots a personal history of cholecystectomy.  - Recommend to check glucose continuously with FL3+ CGM.     Hypertension: - Currently controlled with home and clinic BP below goal < 130/80 mmHg . He appears euvolemic.  - Reviewed long term  cardiovascular and renal outcomes of uncontrolled blood pressure - Reviewed appropriate blood pressure monitoring technique and reviewed goal blood pressure. Recommended to check home blood pressure and heart rate daily. - Recommend to carvedilol  25 mg BID, spironolactone  25 mg daily, losartan  50 mg daily, Farxiga  10 mg daily - Recommend to continue furosemide  to 40 mg daily PRN weight gain of 2-3 lbs in one day or 5 lbs in one week - Consider reconnecting with cardiology for repeat echo at some point, though patient is not symptomatic at this time.   Hyperlipidemia/ASCVD Risk Reduction: - Currently controlled with LDL-C of 22 mg/dL below goal < 70 mg/dL and triglycerides improved from > 400 mg/dL to mid 799d on max statin + Lovaza . Appropriate to continue current regimen. - Reviewed long term complications of uncontrolled cholesterol - Recommend to continue atorvastatin  80 mg daily, Lovaza  1 g BID  Follow Up Plan: PCP 02/07/25, pharmacist telephone 01/14/25  Lorain Baseman, PharmD [facility] [phone]

## 2024-12-15 NOTE — Patient Instructions (Signed)
 It was nice to see you today!  Your goal blood sugar is 80-130 mg/dL before eating and less than 180 mg/dL after eating. Monitor blood sugars at home and keep a log (glucometer or piece of paper) to bring with you to your next visit.  Medication Changes: DECREASE metformin  to 1 tablet (500 mg) once daily for one week. If you continue to have low sugars, try discontinuing metformin  and continue to monitor your sugars.   Continue Ozempic  1 mg weekly and Farxiga  10 mg daily.  Continue all other medication the same.   If you experience symptoms of a low blood sugar (dizzy, shaky, sweaty) check your blood sugar. If it is less than 70 mg/dL, you should eat or drink 15 grams of fast-acting carbohydrates like 4 glucose tablets, 1/2 cup of regular soda, or 1/2 cup of juice. Recheck your blood sugar after 15 minutes. If the level is still below 70 mg/dL repeat the process. If this occurs frequently, you should notify your healthcare provider.   Lifestyle Recommendations:  Aim for 150 minutes of moderate intensity exercise every week. This is any activity that elevates your heart rate and breathing rate, but still allows you to carry on a conversation. Exercising after meals can help prevent spikes in your blood sugar.  Diet Recommendations:   Try to eat 3 real meals using the healthy plate method and 1-2 snacks per day. Never go more than 4-5 hours while awake without eating. Eat breakfast within the first hour of getting up.     Carbohydrates include starch, sugar, and fiber. Sugar and starch raise blood glucose.  Starchy foods include bread, rice, pasta, potatoes, corn, cereal, grits, crackers, bagels, muffins, all baked foods Some fruits are higher in sugar, like grapes, watermelon, oranges, and most tropical fruits. Limit your serving size to 1/2 cup at a time.  Protein foods include meat, fish, poultry, eggs, dairy, and beans (although beans also provide carbohydrates).  Non-starchy  vegetables do not impact your blood sugar very much. These include greens, broccoli, asparagus, carrots, cauliflower, cucumber, mushrooms, peppers, yellow squash, zucchini squash, tomato, to name a few!  Avoid all sugary beverages. Stay hydrated with water throughout the day. Sparkling/flavored water, unsweet tea, black coffee, and zero-sugar or diet drinks will not impact your blood sugar, but they should not be used as your only source of hydration!  You can find more information at https://diabetes.org/food-nutrition/eating-healthy   Jared Hart, PharmD [facility] [phone]

## 2024-12-27 ENCOUNTER — Other Ambulatory Visit: Payer: Self-pay

## 2024-12-27 ENCOUNTER — Other Ambulatory Visit: Payer: Self-pay | Admitting: Nurse Practitioner

## 2024-12-27 DIAGNOSIS — I10 Essential (primary) hypertension: Secondary | ICD-10-CM

## 2024-12-27 DIAGNOSIS — R7309 Other abnormal glucose: Secondary | ICD-10-CM

## 2024-12-27 DIAGNOSIS — E118 Type 2 diabetes mellitus with unspecified complications: Secondary | ICD-10-CM

## 2024-12-27 NOTE — Telephone Encounter (Signed)
 tiZANidine  (ZANAFLEX ) 4 MG tablet      omega-3 acid ethyl esters (LOVAZA ) 1 g capsule      spironolactone  (ALDACTONE ) 25 MG tablet      cetirizine  (ZYRTEC ) 10 MG tablet       glipiZIDE  (GLUCOTROL ) 10 MG tablet     The original prescription was discontinued on 10/20/2024 by Brinda Lorain SQUIBB, RPH-CPP for the following reason: Change in therapy. Renewing this prescription may not be appropriate.

## 2024-12-28 ENCOUNTER — Other Ambulatory Visit: Payer: Self-pay

## 2024-12-28 MED ORDER — OMEGA-3-ACID ETHYL ESTERS 1 G PO CAPS
1.0000 g | ORAL_CAPSULE | Freq: Two times a day (BID) | ORAL | 2 refills | Status: AC
  Filled 2024-12-28: qty 60, 30d supply, fill #0
  Filled 2025-01-31: qty 60, 30d supply, fill #1

## 2024-12-28 MED ORDER — SPIRONOLACTONE 25 MG PO TABS
25.0000 mg | ORAL_TABLET | Freq: Every day | ORAL | 2 refills | Status: AC
  Filled 2024-12-28: qty 30, 30d supply, fill #0
  Filled 2025-01-31: qty 30, 30d supply, fill #1

## 2024-12-28 MED ORDER — CETIRIZINE HCL 10 MG PO TABS
10.0000 mg | ORAL_TABLET | Freq: Every day | ORAL | 0 refills | Status: DC | PRN
  Filled 2024-12-28: qty 30, 30d supply, fill #0

## 2024-12-28 MED ORDER — GLIPIZIDE 10 MG PO TABS
10.0000 mg | ORAL_TABLET | Freq: Every day | ORAL | 0 refills | Status: DC
  Filled 2024-12-28: qty 90, 90d supply, fill #0

## 2024-12-28 MED ORDER — TIZANIDINE HCL 4 MG PO TABS
4.0000 mg | ORAL_TABLET | Freq: Three times a day (TID) | ORAL | 2 refills | Status: AC | PRN
  Filled 2024-12-28: qty 30, 10d supply, fill #0

## 2024-12-28 NOTE — Telephone Encounter (Signed)
 Please advise [facility]

## 2024-12-31 ENCOUNTER — Other Ambulatory Visit: Payer: Self-pay

## 2025-01-14 ENCOUNTER — Other Ambulatory Visit: Payer: Self-pay

## 2025-01-14 DIAGNOSIS — E1165 Type 2 diabetes mellitus with hyperglycemia: Secondary | ICD-10-CM

## 2025-01-14 NOTE — Progress Notes (Signed)
 "  01/14/2025 Name: Jared Hart MRN: [id number] DOB: 05-18-1966  Chief Complaint  Patient presents with   Diabetes    Jared Hart is a 59 y.o. year old male who presented for a face-to-face visit.   They were referred to the pharmacist by their PCP for assistance in managing diabetes, hypertension, and hyperlipidemia. PMH includes HTN, HFrEF (EF 40-45% in 2017), TIA and CVA (2023), T2DM, HLD, BMI > 30.    Subjective: Patient was seen by PCP, Bascom Borer, NP on 11/05/24. Last A1C was 6.1%. he reported hypoglycemia. I outreached him on 11/08/24. Patient reported he had not taken any Lantus  in 2 weeks. 2 week TIR was 89%, TBR 5%, TAR 6%. I instructed him to continue holding Lantus  and decrease metformin  to 500 mg BID. He was instructed to continue Ozempic  1 mg weekly, Farxiga  10 mg daily, and was encouraged to double check low CGM readings with glucometer. At pharmacy appt on 12/15/24, patient was instructed to decrease metformin  to 500 mg daily and discontinue completely if hypoglycemia continued.   Today, patient reports doing well. Not having symptoms of hypoglycemia, but still getting occasional hypoglycemia alarms (consistent with AGP report). He is taking metformin  1.5 tablets (750 mg daily). Is having some worsened constipation. Still taking Linzess , but has not tried Miralax  recently.    Care Team: Primary Care Provider: Borer Bascom RAMAN, NP ; Next Scheduled Visit: 02/07/25 Urology Provider: Glendia Barba, MD, not scheduled  Medication Access/Adherence  Current Pharmacy:  [facility] [address], [address] [place] [address] Phone: [phone] Fax: [phone]   Patient reports affordability concerns with their medications: No  Patient reports access/transportation concerns to their pharmacy: No  Patient reports adherence concerns with their medications:  No     Diabetes:  Current medications: metformin  XR 500 mg  daily (patient taking 750 mg daily), Ozempic  1 mg weekly (Thursdays), Farxiga  10 mg daily Medications tried in the past: N/A  Reports some constipation. Appetite is stable. Reports eating small portions throughout the day. Denies nausea, vomiting.   Current glucose readings: Using Freestyle Libre 3+, Backup meter: TrueMetrix      Patient denies hypoglycemic s/sx including shakiness, lightheadedness - feels his body is adjusting to lower sugars. Has not confirmed with finger sticks recently.   Patient denies hyperglycemic symptoms including polyuria, polydipsia, nocturia, neuropathy, blurred vision.   Current meal patterns: Typically 1-2 meals daily - Breakfast: bowl of cheerios, sometimes skips breakfast. 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 Also doing strength training.   Current medication access support: none- Medicaid insurance  Hypertension/HFrEF:  Current medications: carvedilol  25 mg BID, losartan  50 mg daily, spironolactone  25 mg daily, furosemide  40 mg daily PRN, Farxiga  10 mg daily  Patient has a validated, automated, upper arm home BP cuff - checking nightly Current blood pressure readings readings: Pt reports home BP are always less than 130/80   Patient endorses hypotensive s/sx including occasionally dizziness, lightheadedness.  Patient denies hypertensive symptoms including headache, chest pain, shortness of breath  Patient takes weight daily. Currently stable. No issues with fluid accumulation in LE.    Hyperlipidemia/ASCVD Risk Reduction  Current lipid lowering medications: atorvastatin  80 mg daily, Lovaza  1 g BID  Antiplatelet regimen: aspirin  325 mg daily  ASCVD History: CVA Risk  Factors: T2DM, HTN, ASCVD hx, HFrEF  Clinical ASCVD: Yes  The ASCVD Risk score (Arnett DK, et al.,  2019) failed to calculate for the following reasons:   Risk score cannot be calculated because patient has a medical history suggesting prior/existing ASCVD   * - Cholesterol units were assumed   Objective:  Baseline weight: 261 lbs (05/04/24) > 247 lbs (09/08/24) > 234.4 lbs (10/20/24) > 225.6 (12/15/24)  BP Readings from Last 3 Encounters:  12/15/24 115/65  11/05/24 108/65  09/08/24 127/63    Lab Results  Component Value Date   HGBA1C 6.1 10/20/2024   HGBA1C 7.8 (A) 07/14/2024   HGBA1C 7.7 (A) 02/09/2024     Lab Results  Component Value Date   CREATININE 1.01 08/05/2024   BUN 12 08/05/2024   NA 140 08/05/2024   K 3.9 08/05/2024   CL 99 08/05/2024   CO2 21 08/05/2024    Lab Results  Component Value Date   CHOL 80 (L) 08/05/2024   HDL 22 (L) 08/05/2024   LDLCALC 22 08/05/2024   LDLDIRECT 81.4 11/01/2013   TRIG 233 (H) 08/05/2024   CHOLHDL 3.6 08/05/2024    Medications Reviewed Today     Reviewed by Brinda Lorain SQUIBB, RPH-CPP (Pharmacist) on 01/14/25 at [phone]  Med List Status: <None>   Medication Order Taking? Sig Documenting Provider Last Dose Status Informant  amLODipine  (NORVASC ) 10 MG tablet [id number] Yes Take 1 tablet (10 mg total) by mouth daily. Oley Bascom RAMAN, NP  Active   aspirin  325 MG tablet [id number]  Take 325 mg by mouth daily. [provider]  Active   atorvastatin  (LIPITOR) 80 MG tablet [id number] Yes Take 1 tablet (80 mg total) by mouth daily. Oley Bascom RAMAN, NP  Active   blood glucose meter kit and supplies [id number]  Dispense based on patient and insurance preference. Use up to four times daily as directed. (FOR ICD-10 E10.9, E11.9). Stroud, Natalie M, FNP  Active Self  carvedilol  (COREG ) 25 MG tablet [id number] Yes Take 1 tablet (25 mg total) by mouth 2 (two) times daily with a meal. Oley Bascom RAMAN, NP  Active   cetirizine  (ZYRTEC ) 10 MG tablet [id number]  Take 1 tablet (10 mg total) by mouth daily as needed. Oley Bascom RAMAN, NP  Active    Continuous Glucose Sensor (FREESTYLE LIBRE 3 PLUS SENSOR) [place] [id number] Yes Change sensor every 15 days. Oley Bascom RAMAN, NP  Active   cyclobenzaprine  (FLEXERIL ) 5 MG tablet [id number]  Take 1 tablet (5 mg total) by mouth 3 (three) times daily for lower back spasms.   Active   dapagliflozin  propanediol (FARXIGA ) 10 MG TABS tablet [id number] Yes Take 1 tablet (10 mg total) by mouth daily. Oley Bascom RAMAN, NP  Active   fluticasone  (FLONASE ) 50 MCG/ACT nasal spray [id number]  Place 2 sprays into both nostrils daily.   Active   furosemide  (LASIX ) 40 MG tablet [id number] Yes Take 1 tablet (40 mg total) by mouth daily as needed for fluid ((weight gain of 2 lbs in one day or 5 lbs in one week)). Oley Bascom RAMAN, NP  Active   gabapentin  (NEURONTIN ) 100 MG capsule [id number]  Take 1 capsule (100 mg total) by mouth 2 (two) times daily. Oley Bascom RAMAN, NP  Active     Discontinued 01/14/25 [phone] (Change in therapy)   Insulin  Pen Needle [place] MINI PEN NEEDLES) 31G X 6 MM MISC [id number]  Use as directed Arloa Suzen RAMAN, NP  Active Self  lidocaine  (LIDODERM ) 5 %  [id number]  Place 1 patch onto the skin daily. Oley Bascom RAMAN, NP  Active   linaclotide  (LINZESS ) 145 MCG CAPS capsule [id number] Yes Take 1 capsule (145 mcg total) by mouth daily before breakfast. May, Deanna J, NP  Active   losartan  (COZAAR ) 50 MG tablet [id number] Yes Take 1 tablet (50 mg total) by mouth daily. Oley Bascom RAMAN, NP  Active   metFORMIN  (GLUCOPHAGE -XR) 500 MG 24 hr tablet [id number] Yes Take 1 tablet (500 mg total) by mouth daily with breakfast. Nichols, Tonya S, NP  Active   Multiple Vitamin (MULTIVITAMIN WITH MINERALS) TABS tablet [id number]  Take 1 tablet by mouth daily with breakfast. [provider]  Active Self  omega-3 acid ethyl esters (LOVAZA ) 1 g capsule [id number]  Take 1 capsule (1 g total) by mouth 2 (two) times daily. Oley Bascom RAMAN, NP  Active   Semaglutide , 1 MG/DOSE, (OZEMPIC , 1 MG/DOSE,) 4 MG/3ML SOPN  [id number] Yes Inject 1 mg into the skin once a week. Oley Bascom RAMAN, NP  Active   senna (SENOKOT) 8.6 MG tablet [id number]  Take 1 tablet (8.6 mg total) by mouth 2 (two) times daily. Oley Bascom RAMAN, NP  Active   spironolactone  (ALDACTONE ) 25 MG tablet [id number] Yes Take 1 tablet (25 mg total) by mouth daily. Oley Bascom RAMAN, NP  Active   tadalafil  (CIALIS ) 20 MG tablet [id number]  Take 1 tablet (20 mg total) by mouth daily as needed for erectile dysfunction. Oley Bascom RAMAN, NP  Active   tiZANidine  (ZANAFLEX ) 2 MG tablet [id number]  Take 1 tablet (2 mg total) by mouth every 6-8 hours as needed.   Active   tiZANidine  (ZANAFLEX ) 4 MG tablet [id number]  Take 1 tablet (4 mg total) by mouth 3 (three) times daily as needed for muscle spasms. Oley Bascom RAMAN, NP  Active   Med List Note Chari, Mulino I, NP 03/01/22 [id number]):                Assessment/Plan:   Diabetes: - Currently controlled with A1C 6.1% below goal < 7%, consistent with AGP report today. Will discontinue metformin  to try to help patient remain on GLP-1RA and SGLT2i given ASCVD and HF hx, however, may require dose-decrease of Ozempic  to 0.5 mg weekly if hypoglycemia continues or constipation worsens. Noted glipizide  was erroneously refilled by PCP. Encouraged patient to confirm he is no longer taking.  - UACR May 2025 - 1,108 mg/g - appropriate treated with SGLT2i - Reviewed long term cardiovascular and renal outcomes of uncontrolled blood sugar - Reviewed goal A1c, goal fasting, and goal 2 hour post prandial glucose - Reviewed dietary modifications including - having small portions of high protein meals throughout the day, reducing carbohydrate heavy foods, drinking more water - Reviewed lifestyle modifications including: increasing physical activity - Recommend to stop metformin  XR - Recommend to continue Ozempic  1 mg weekly. Advised to try Mirlax once daily for constipation. Also encouraged patient to reach out if he would  like to decrease to Ozempic  0.5 mg weekly if hypoglycemia persists or constipation worsens. - Recommend to continue Farxiga  10 mg daily - Asked patient to confirm that he is no longer taking glipizide  - Patient denies personal or family history of multiple endocrine neoplasia type 2, medullary thyroid cancer; personal history of pancreatitis. He repots a personal history of cholecystectomy.  - Recommend to check glucose continuously with FL3+ CGM.     Hypertension: - Currently controlled with home and clinic BP below goal < 130/80 mmHg .  -  Reviewed long term cardiovascular and renal outcomes of uncontrolled blood pressure - Reviewed appropriate blood pressure monitoring technique and reviewed goal blood pressure. Recommended to check home blood pressure and heart rate daily. - Recommend to carvedilol  25 mg BID, spironolactone  25 mg daily, losartan  50 mg daily, Farxiga  10 mg daily - Recommend to continue furosemide  to 40 mg daily PRN weight gain of 2-3 lbs in one day or 5 lbs in one week - Consider reconnecting with cardiology for repeat echo at some point, though patient is not symptomatic at this time.   Hyperlipidemia/ASCVD Risk Reduction: - Currently controlled with LDL-C of 22 mg/dL below goal < 70 mg/dL and triglycerides improved from > 400 mg/dL to mid 799d on max statin + Lovaza . Appropriate to continue current regimen. - Reviewed long term complications of uncontrolled cholesterol - Recommend to continue atorvastatin  80 mg daily, Lovaza  1 g BID  Follow Up Plan: PCP 02/07/25, pharmacist telephone 03/04/25  Lorain Baseman, PharmD [facility] [phone]  "

## 2025-01-27 ENCOUNTER — Other Ambulatory Visit: Payer: Self-pay

## 2025-01-27 MED ORDER — TIZANIDINE HCL 2 MG PO TABS
2.0000 mg | ORAL_TABLET | ORAL | 2 refills | Status: AC
  Filled 2025-01-27: qty 30, 8d supply, fill #0

## 2025-01-31 ENCOUNTER — Other Ambulatory Visit: Payer: Self-pay

## 2025-01-31 ENCOUNTER — Other Ambulatory Visit: Payer: Self-pay | Admitting: Nurse Practitioner

## 2025-01-31 DIAGNOSIS — G8929 Other chronic pain: Secondary | ICD-10-CM

## 2025-01-31 DIAGNOSIS — I10 Essential (primary) hypertension: Secondary | ICD-10-CM

## 2025-01-31 DIAGNOSIS — I6320 Cerebral infarction due to unspecified occlusion or stenosis of unspecified precerebral arteries: Secondary | ICD-10-CM

## 2025-01-31 DIAGNOSIS — K5900 Constipation, unspecified: Secondary | ICD-10-CM

## 2025-01-31 MED ORDER — ATORVASTATIN CALCIUM 80 MG PO TABS
80.0000 mg | ORAL_TABLET | Freq: Every day | ORAL | 0 refills | Status: AC
  Filled 2025-01-31: qty 90, 90d supply, fill #0

## 2025-01-31 MED ORDER — CARVEDILOL 25 MG PO TABS
25.0000 mg | ORAL_TABLET | Freq: Two times a day (BID) | ORAL | 1 refills | Status: AC
  Filled 2025-01-31: qty 180, 90d supply, fill #0

## 2025-01-31 MED ORDER — SENNOSIDES 8.6 MG PO TABS
1.0000 | ORAL_TABLET | Freq: Two times a day (BID) | ORAL | 2 refills | Status: AC
  Filled 2025-01-31: qty 60, 30d supply, fill #0

## 2025-01-31 MED ORDER — CETIRIZINE HCL 10 MG PO TABS
10.0000 mg | ORAL_TABLET | Freq: Every day | ORAL | 0 refills | Status: AC | PRN
  Filled 2025-01-31: qty 30, 30d supply, fill #0

## 2025-01-31 NOTE — Telephone Encounter (Signed)
 Please Advise.  CB.

## 2025-02-01 ENCOUNTER — Other Ambulatory Visit: Payer: Self-pay

## 2025-02-02 ENCOUNTER — Other Ambulatory Visit: Payer: Self-pay

## 2025-02-02 MED ORDER — GABAPENTIN 100 MG PO CAPS
100.0000 mg | ORAL_CAPSULE | Freq: Two times a day (BID) | ORAL | 2 refills | Status: AC
  Filled 2025-02-02: qty 60, 30d supply, fill #0

## 2025-02-03 ENCOUNTER — Other Ambulatory Visit: Payer: Self-pay

## 2025-02-07 ENCOUNTER — Ambulatory Visit: Payer: Self-pay | Admitting: Nurse Practitioner

## 2025-03-04 ENCOUNTER — Other Ambulatory Visit
